# Patient Record
Sex: Female | Born: 1940 | Race: Black or African American | Hispanic: No | Smoking: Former smoker
Health system: Southern US, Community
[De-identification: ages and names within clinical notes are randomized; demographics above are authoritative.]

## PROBLEM LIST (undated history)

## (undated) DIAGNOSIS — J189 Pneumonia, unspecified organism: Secondary | ICD-10-CM

## (undated) DIAGNOSIS — R0600 Dyspnea, unspecified: Secondary | ICD-10-CM

## (undated) DIAGNOSIS — C7951 Secondary malignant neoplasm of bone: Principal | ICD-10-CM

## (undated) DIAGNOSIS — C3490 Malignant neoplasm of unspecified part of unspecified bronchus or lung: Secondary | ICD-10-CM

## (undated) DIAGNOSIS — D649 Anemia, unspecified: Secondary | ICD-10-CM

## (undated) DIAGNOSIS — R7303 Prediabetes: Secondary | ICD-10-CM

## (undated) DIAGNOSIS — I10 Essential (primary) hypertension: Secondary | ICD-10-CM

## (undated) DIAGNOSIS — E785 Hyperlipidemia, unspecified: Secondary | ICD-10-CM

## (undated) HISTORY — DX: Malignant neoplasm of unspecified part of unspecified bronchus or lung: C34.90

## (undated) HISTORY — DX: Secondary malignant neoplasm of bone: C79.51

## (undated) HISTORY — PX: APPENDECTOMY: SHX54

## (undated) HISTORY — PX: ABDOMINAL HYSTERECTOMY: SHX81

---

## 2000-07-18 ENCOUNTER — Ambulatory Visit (HOSPITAL_COMMUNITY): Admission: RE | Admit: 2000-07-18 | Discharge: 2000-07-18 | Payer: Self-pay | Admitting: Family Medicine

## 2000-07-18 ENCOUNTER — Encounter: Payer: Self-pay | Admitting: Family Medicine

## 2003-02-23 ENCOUNTER — Ambulatory Visit (HOSPITAL_COMMUNITY): Admission: RE | Admit: 2003-02-23 | Discharge: 2003-02-23 | Payer: Self-pay | Admitting: Family Medicine

## 2005-08-19 ENCOUNTER — Ambulatory Visit (HOSPITAL_COMMUNITY): Admission: RE | Admit: 2005-08-19 | Discharge: 2005-08-19 | Payer: Self-pay | Admitting: Family Medicine

## 2006-07-16 ENCOUNTER — Ambulatory Visit (HOSPITAL_COMMUNITY): Admission: RE | Admit: 2006-07-16 | Discharge: 2006-07-16 | Payer: Self-pay | Admitting: General Surgery

## 2007-12-23 ENCOUNTER — Ambulatory Visit (HOSPITAL_COMMUNITY): Admission: RE | Admit: 2007-12-23 | Discharge: 2007-12-23 | Payer: Self-pay | Admitting: Family Medicine

## 2009-02-21 ENCOUNTER — Ambulatory Visit (HOSPITAL_COMMUNITY): Admission: RE | Admit: 2009-02-21 | Discharge: 2009-02-21 | Payer: Self-pay | Admitting: Family Medicine

## 2010-06-19 NOTE — H&P (Signed)
Melinda Larsen, Melinda Larsen               ACCOUNT NO.:  [id number]   MEDICAL RECORD NO.:  [id number]          PATIENT TYPE:  AMB   LOCATION:                                FACILITY:  [facility]   PHYSICIAN:  Dalia Heading, M.D.  DATE OF BIRTH:  10/05/1940   DATE OF ADMISSION:  DATE OF DISCHARGE:  LH                              HISTORY & PHYSICAL   CHIEF COMPLAINT:  Need for screening colonoscopy.   HISTORY OF PRESENT ILLNESS:  The patient is a 70 year old black female  who is referred for endoscopic evaluation.  She needs a colonoscopy for  screening purposes.  No abdominal pain, weight loss, nausea, vomiting,  diarrhea, constipation, melena, hematochezia have been noted.  She has  never had a colonoscopy.  There is no family history of colon carcinoma.   PAST MEDICAL HISTORY:  Hypertension.   PAST SURGICAL HISTORY:  Hysterectomy.   CURRENT MEDICATIONS:  1. Blood pressure pill.  2. Fluid pill.  3. Cholesterol pill.   ALLERGIES:  No known drug allergies.   REVIEW OF SYSTEMS:  The patient does smoke.  She denies any alcohol use.  She denies any cardiopulmonary difficulties or bleeding disorders.   PHYSICAL EXAMINATION:  GENERAL:  The patient is a well-developed, well-  nourished black female in no acute distress.  LUNGS:  Clear to auscultation with good breath sounds bilaterally.  HEART:  Regular rate and rhythm without S3, S4, murmurs.  ABDOMEN:  Soft, nontender, nondistended.  No hepatosplenomegaly or  masses noted.  RECTAL:  Deferred to the procedure.   IMPRESSION:  Need for screening colonoscopy.   PLAN:  The patient was scheduled for a colonoscopy on July 16, 2006.  The risks and benefits of the procedure including bleeding and  perforation were fully explained to the patient.  Gave informed consent.      Dalia Heading, M.D.  Electronically Signed     MAJ/MEDQ  D:  07/03/2006  T:  07/03/2006  Job:  [id number]   cc:   Patrica Duel, M.D.  Fax: [phone]

## 2010-06-22 NOTE — H&P (Signed)
Melinda Larsen, Melinda Larsen               ACCOUNT NO.:  [id number]   MEDICAL RECORD NO.:  [id number]          PATIENT TYPE:  OUT   LOCATION:  [facility]                           FACILITY:  [facility]   PHYSICIAN:  Dalia Heading, M.D.  DATE OF BIRTH:  1940-06-12   DATE OF ADMISSION:  12/23/2007  DATE OF DISCHARGE:  LH                              HISTORY & PHYSICAL   CHIEF COMPLAINT:  Need for screening colonoscopy.   HISTORY OF PRESENT ILLNESS:  The patient is a 70 year old black female  who is referred for endoscopic evaluation.  She needs a colonoscopy for  screening purposes.  No abdominal pain, weight loss, nausea, vomiting,  diarrhea, constipation, melena, hematochezia have been noted.  She has  never had a colonoscopy.  There is no family history of colon carcinoma.   PAST MEDICAL HISTORY:  A left breast carcinoma, hypertension, morbid  obesity, subacute thyroiditis.   PAST SURGICAL HISTORY:  Left partial mastectomy with sentinel lymph node  biopsy in 2006.   CURRENT MEDICATIONS:  Lotrel, Diovan/hydrochlorothiazide, Crestor.   ALLERGIES:  No known drug allergies.   REVIEW OF SYSTEMS:  Noncontributory.   PHYSICAL EXAMINATION:  GENERAL:  The patient is a well-developed, well-  nourished black female in no acute distress.  LUNGS:  Clear to auscultation with equal breath sounds bilaterally.  HEART:  Examination reveals regular rate and rhythm without S3, S4, or  murmurs.  ABDOMEN:  Soft, nontender, nondistended.  No hepatosplenomegaly or  masses noted.  RECTAL:  Examination was deferred due to the procedure.   IMPRESSION:  Need for screening colonoscopy.   PLAN:  The patient is scheduled for a colonoscopy on January 11, 2008.  The risks and benefits of the procedure including bleeding and  perforation were fully explained to the patient, gave informed consent.      Dalia Heading, M.D.     MAJ/MEDQ  D:  12/24/2007  T:  12/24/2007  Job:  [id number]   cc:   Milus Mallick. Lodema Hong,  M.D.  Fax: [phone]   Short Stay at [facility] S. Mariel Sleet, MD  Fax: [phone]

## 2010-08-13 ENCOUNTER — Other Ambulatory Visit (HOSPITAL_COMMUNITY): Payer: Self-pay | Admitting: Internal Medicine

## 2010-08-13 DIAGNOSIS — Z139 Encounter for screening, unspecified: Secondary | ICD-10-CM

## 2010-08-13 DIAGNOSIS — I10 Essential (primary) hypertension: Secondary | ICD-10-CM

## 2010-08-16 ENCOUNTER — Ambulatory Visit (HOSPITAL_COMMUNITY)
Admission: RE | Admit: 2010-08-16 | Discharge: 2010-08-16 | Disposition: A | Discharge status: Home / Self Care | Payer: No Typology Code available for payment source | Source: Ambulatory Visit | Attending: Internal Medicine | Admitting: Internal Medicine

## 2010-08-16 ENCOUNTER — Encounter (HOSPITAL_COMMUNITY): Payer: Self-pay

## 2010-08-16 DIAGNOSIS — Z1231 Encounter for screening mammogram for malignant neoplasm of breast: Secondary | ICD-10-CM | POA: Insufficient documentation

## 2010-08-16 DIAGNOSIS — I10 Essential (primary) hypertension: Secondary | ICD-10-CM

## 2010-08-16 DIAGNOSIS — M899 Disorder of bone, unspecified: Secondary | ICD-10-CM | POA: Insufficient documentation

## 2010-08-16 DIAGNOSIS — Z139 Encounter for screening, unspecified: Secondary | ICD-10-CM

## 2010-08-16 DIAGNOSIS — M949 Disorder of cartilage, unspecified: Secondary | ICD-10-CM | POA: Insufficient documentation

## 2010-08-16 HISTORY — DX: Essential (primary) hypertension: I10

## 2011-09-10 ENCOUNTER — Other Ambulatory Visit (HOSPITAL_COMMUNITY): Payer: Self-pay | Admitting: Internal Medicine

## 2011-09-10 DIAGNOSIS — Z139 Encounter for screening, unspecified: Secondary | ICD-10-CM

## 2011-09-16 ENCOUNTER — Ambulatory Visit (HOSPITAL_COMMUNITY): Payer: No Typology Code available for payment source

## 2011-09-16 ENCOUNTER — Ambulatory Visit (HOSPITAL_COMMUNITY)
Admission: RE | Admit: 2011-09-16 | Discharge: 2011-09-16 | Disposition: A | Discharge status: Home / Self Care | Payer: No Typology Code available for payment source | Source: Ambulatory Visit | Attending: Internal Medicine | Admitting: Internal Medicine

## 2011-09-16 DIAGNOSIS — Z139 Encounter for screening, unspecified: Secondary | ICD-10-CM

## 2011-09-16 DIAGNOSIS — Z1231 Encounter for screening mammogram for malignant neoplasm of breast: Secondary | ICD-10-CM | POA: Insufficient documentation

## 2013-04-02 ENCOUNTER — Other Ambulatory Visit (HOSPITAL_COMMUNITY): Payer: Self-pay | Admitting: Internal Medicine

## 2013-04-02 DIAGNOSIS — Z139 Encounter for screening, unspecified: Secondary | ICD-10-CM

## 2013-04-08 ENCOUNTER — Ambulatory Visit (HOSPITAL_COMMUNITY)
Admission: RE | Admit: 2013-04-08 | Discharge: 2013-04-08 | Disposition: A | Discharge status: Home / Self Care | Payer: Medicare HMO | Source: Ambulatory Visit | Attending: Internal Medicine | Admitting: Internal Medicine

## 2013-04-08 DIAGNOSIS — Z139 Encounter for screening, unspecified: Secondary | ICD-10-CM

## 2013-04-08 DIAGNOSIS — Z1231 Encounter for screening mammogram for malignant neoplasm of breast: Secondary | ICD-10-CM | POA: Insufficient documentation

## 2014-04-08 DIAGNOSIS — Z6824 Body mass index (BMI) 24.0-24.9, adult: Secondary | ICD-10-CM | POA: Diagnosis not present

## 2014-04-08 DIAGNOSIS — I10 Essential (primary) hypertension: Secondary | ICD-10-CM | POA: Diagnosis not present

## 2014-04-08 DIAGNOSIS — K219 Gastro-esophageal reflux disease without esophagitis: Secondary | ICD-10-CM | POA: Diagnosis not present

## 2014-04-08 DIAGNOSIS — E119 Type 2 diabetes mellitus without complications: Secondary | ICD-10-CM | POA: Diagnosis not present

## 2014-06-08 DIAGNOSIS — H5210 Myopia, unspecified eye: Secondary | ICD-10-CM | POA: Diagnosis not present

## 2014-06-08 DIAGNOSIS — H5200 Hypermetropia, unspecified eye: Secondary | ICD-10-CM | POA: Diagnosis not present

## 2014-06-08 DIAGNOSIS — I10 Essential (primary) hypertension: Secondary | ICD-10-CM | POA: Diagnosis not present

## 2014-06-28 DIAGNOSIS — E1140 Type 2 diabetes mellitus with diabetic neuropathy, unspecified: Secondary | ICD-10-CM | POA: Diagnosis not present

## 2014-07-20 DIAGNOSIS — I10 Essential (primary) hypertension: Secondary | ICD-10-CM | POA: Diagnosis not present

## 2014-07-20 DIAGNOSIS — E782 Mixed hyperlipidemia: Secondary | ICD-10-CM | POA: Diagnosis not present

## 2014-07-20 DIAGNOSIS — K219 Gastro-esophageal reflux disease without esophagitis: Secondary | ICD-10-CM | POA: Diagnosis not present

## 2014-07-20 DIAGNOSIS — Z6822 Body mass index (BMI) 22.0-22.9, adult: Secondary | ICD-10-CM | POA: Diagnosis not present

## 2014-07-20 DIAGNOSIS — E119 Type 2 diabetes mellitus without complications: Secondary | ICD-10-CM | POA: Diagnosis not present

## 2014-07-20 DIAGNOSIS — E1140 Type 2 diabetes mellitus with diabetic neuropathy, unspecified: Secondary | ICD-10-CM | POA: Diagnosis not present

## 2014-07-20 DIAGNOSIS — Z0000 Encounter for general adult medical examination without abnormal findings: Secondary | ICD-10-CM | POA: Diagnosis not present

## 2014-09-13 DIAGNOSIS — H524 Presbyopia: Secondary | ICD-10-CM | POA: Diagnosis not present

## 2014-09-13 DIAGNOSIS — H25012 Cortical age-related cataract, left eye: Secondary | ICD-10-CM | POA: Diagnosis not present

## 2014-09-13 DIAGNOSIS — H2511 Age-related nuclear cataract, right eye: Secondary | ICD-10-CM | POA: Diagnosis not present

## 2014-09-13 DIAGNOSIS — H2512 Age-related nuclear cataract, left eye: Secondary | ICD-10-CM | POA: Diagnosis not present

## 2014-09-23 DIAGNOSIS — H2512 Age-related nuclear cataract, left eye: Secondary | ICD-10-CM | POA: Diagnosis not present

## 2014-09-23 DIAGNOSIS — H25812 Combined forms of age-related cataract, left eye: Secondary | ICD-10-CM | POA: Diagnosis not present

## 2014-09-30 DIAGNOSIS — Z961 Presence of intraocular lens: Secondary | ICD-10-CM | POA: Diagnosis not present

## 2014-09-30 DIAGNOSIS — H52223 Regular astigmatism, bilateral: Secondary | ICD-10-CM | POA: Diagnosis not present

## 2014-09-30 DIAGNOSIS — E119 Type 2 diabetes mellitus without complications: Secondary | ICD-10-CM | POA: Diagnosis not present

## 2014-10-11 ENCOUNTER — Encounter (HOSPITAL_COMMUNITY): Payer: Self-pay | Admitting: Emergency Medicine

## 2014-10-11 ENCOUNTER — Emergency Department (HOSPITAL_COMMUNITY)
Admission: EM | Admit: 2014-10-11 | Discharge: 2014-10-11 | Disposition: A | Discharge status: Home / Self Care | Payer: Commercial Managed Care - HMO | Attending: Emergency Medicine | Admitting: Emergency Medicine

## 2014-10-11 ENCOUNTER — Emergency Department (HOSPITAL_COMMUNITY): Payer: Commercial Managed Care - HMO

## 2014-10-11 DIAGNOSIS — S52572A Other intraarticular fracture of lower end of left radius, initial encounter for closed fracture: Secondary | ICD-10-CM | POA: Diagnosis not present

## 2014-10-11 DIAGNOSIS — I10 Essential (primary) hypertension: Secondary | ICD-10-CM | POA: Insufficient documentation

## 2014-10-11 DIAGNOSIS — S6992XA Unspecified injury of left wrist, hand and finger(s), initial encounter: Secondary | ICD-10-CM | POA: Diagnosis present

## 2014-10-11 DIAGNOSIS — Z87891 Personal history of nicotine dependence: Secondary | ICD-10-CM | POA: Diagnosis not present

## 2014-10-11 DIAGNOSIS — Y998 Other external cause status: Secondary | ICD-10-CM | POA: Diagnosis not present

## 2014-10-11 DIAGNOSIS — Y9389 Activity, other specified: Secondary | ICD-10-CM | POA: Insufficient documentation

## 2014-10-11 DIAGNOSIS — S52502A Unspecified fracture of the lower end of left radius, initial encounter for closed fracture: Secondary | ICD-10-CM | POA: Diagnosis not present

## 2014-10-11 DIAGNOSIS — Y92481 Parking lot as the place of occurrence of the external cause: Secondary | ICD-10-CM | POA: Insufficient documentation

## 2014-10-11 DIAGNOSIS — S5292XA Unspecified fracture of left forearm, initial encounter for closed fracture: Secondary | ICD-10-CM

## 2014-10-11 DIAGNOSIS — W010XXA Fall on same level from slipping, tripping and stumbling without subsequent striking against object, initial encounter: Secondary | ICD-10-CM | POA: Insufficient documentation

## 2014-10-11 MED ORDER — HYDROCODONE-ACETAMINOPHEN 5-325 MG PO TABS: 1.0000 | ORAL_TABLET | ORAL | Status: DC | PRN

## 2014-10-11 MED ORDER — ACETAMINOPHEN 325 MG PO TABS
650.0000 mg | ORAL_TABLET | Freq: Once | ORAL | Status: AC
  Administered 2014-10-11: 650 mg via ORAL
  Filled 2014-10-11: qty 2

## 2014-10-11 NOTE — ED Notes (Signed)
MD at bedside. 

## 2014-10-11 NOTE — ED Notes (Signed)
Patient states she was at [employer] in the parking lot and tripped. Reports of pain to left wrist/hand. Swelling noted to left hand/wrist. Abrasion noted to left knee. Bleeding controlled. . Denies any other pain.

## 2014-10-11 NOTE — Discharge Instructions (Signed)
Splint Care Splints protect and rest injuries. Splints can be made of plaster, fiberglass, or metal. They are used to treat broken bones, sprains, tendonitis, and other injuries. HOME CARE  Keep the injured area raised (elevated) while sitting or lying down. Keep the injured body part just above the level of the heart. This will decrease puffiness (swelling) and pain.  If an elastic bandage was used to hold the splint, it can be loosened. Only loosen it to make room for puffiness and to ease pain.  Keep the splint clean and dry.  Do not scratch the skin under the splint with sharp or pointed objects.  Follow up with your doctor as told. GET HELP RIGHT AWAY IF:   There is more pain or pressure around the injury.  There is numbness, tingling, or pain in the toes or fingers past the injury.  The fingers or toes become cold or blue.  The splint becomes too soft or breaks before the injury is healed. MAKE SURE YOU:   Understand these instructions.  Will watch this condition.  Will get help right away if you are not doing well or get worse. Document Released: 10/31/2007 Document Revised: 04/15/2011 Document Reviewed: 10/31/2007 [facility] Patient Information 2015 [place], [place]. This information is not intended to replace advice given to you by your health care provider. Make sure you discuss any questions you have with your health care provider.   Forearm Fracture Your caregiver has diagnosed you as having a broken bone (fracture) of the forearm. This is the part of your arm between the elbow and your wrist. Your forearm is made up of two bones. These are the radius and ulna. A fracture is a break in one or both bones. A cast or splint is used to protect and keep your injured bone from moving. The cast or splint will be on generally for about 5 to 6 weeks, with individual variations. HOME CARE INSTRUCTIONS   Keep the injured part elevated while sitting or lying down. Keeping the  injury above the level of your heart (the center of the chest). This will decrease swelling and pain.  Apply ice to the injury for 15-20 minutes, 03-04 times per day while awake, for 2 days. Put the ice in a plastic bag and place a thin towel between the bag of ice and your cast or splint.  If you have a plaster or fiberglass cast:  Do not try to scratch the skin under the cast using sharp or pointed objects.  Check the skin around the cast every day. You may put lotion on any red or sore areas.  Keep your cast dry and clean.  If you have a plaster splint:  Wear the splint as directed.  You may loosen the elastic around the splint if your fingers become numb, tingle, or turn cold or blue.  Do not put pressure on any part of your cast or splint. It may break. Rest your cast only on a pillow the first 24 hours until it is fully hardened.  Your cast or splint can be protected during bathing with a plastic bag. Do not lower the cast or splint into water.  Only take over-the-counter or prescription medicines for pain, discomfort, or fever as directed by your caregiver. SEEK IMMEDIATE MEDICAL CARE IF:   Your cast gets damaged or breaks.  You have more severe pain or swelling than you did before the cast.  Your skin or nails below the injury turn blue or gray,  or feel cold or numb.  There is a bad smell or new stains and/or pus like (purulent) drainage coming from under the cast. MAKE SURE YOU:   Understand these instructions.  Will watch your condition.  Will get help right away if you are not doing well or get worse. Document Released: 01/19/2000 Document Revised: 04/15/2011 Document Reviewed: 09/10/2007 [facility] Patient Information 2015 [place], [place]. This information is not intended to replace advice given to you by your health care provider. Make sure you discuss any questions you have with your health care provider.

## 2014-10-11 NOTE — ED Notes (Signed)
Pt tripped and fell today at 4pm. C/o left wrist pain.

## 2014-10-12 DIAGNOSIS — I10 Essential (primary) hypertension: Secondary | ICD-10-CM | POA: Diagnosis not present

## 2014-10-12 DIAGNOSIS — S52531A Colles' fracture of right radius, initial encounter for closed fracture: Secondary | ICD-10-CM | POA: Diagnosis not present

## 2014-10-12 NOTE — ED Provider Notes (Signed)
CSN: [id number]     Arrival date & time 10/11/14  1744 History   First MD Initiated Contact with Patient 10/11/14 1801     Chief Complaint  Patient presents with  . Fall     (Consider location/radiation/quality/duration/timing/severity/associated sxs/prior Treatment) The history is provided by the patient.   Melinda Larsen is a 74 y.o. female presenting for evaluation of left wrist pain and swelling since she tripped and fell in a store parking lot around 4 pm today landing on her outstretched left hand.  She returned home and has used ice and elevation but her pain persists, hence her delayed presentation.  She denies head injury, neck or other pain.  She has taken no medicine for pain prior to arrival.  She is right handed.     Past Medical History  Diagnosis Date  . Hypertension    Past Surgical History  Procedure Laterality Date  . Abdominal hysterectomy     No family history on file. Social History  Substance Use Topics  . Smoking status: Former Research scientist (life sciences)  . Smokeless tobacco: None  . Alcohol Use: No   OB History    No data available     Review of Systems  Constitutional: Negative for fever.  Musculoskeletal: Positive for joint swelling and arthralgias. Negative for myalgias.  Neurological: Negative for weakness and numbness.      Allergies  Codeine  Home Medications   Prior to Admission medications   Medication Sig Start Date End Date Taking? Authorizing Provider  HYDROcodone-acetaminophen (NORCO/VICODIN) 5-325 MG per tablet Take 1 tablet by mouth every 4 (four) hours as needed. 10/11/14   Evalee Jefferson, PA-C   BP 178/76 mmHg  Pulse 57  Temp(Src) 98.7 F (37.1 C) (Oral)  Resp 17  Ht '5\' 10"'$  (1.778 m)  Wt 160 lb (72.576 kg)  BMI 22.96 kg/m2  SpO2 100% Physical Exam  Constitutional: She appears well-developed and well-nourished.  HENT:  Head: Atraumatic.  Neck: Normal range of motion.  Cardiovascular:  Pulses equal bilaterally  Musculoskeletal: She  exhibits tenderness.       Left wrist: She exhibits decreased range of motion, tenderness, bony tenderness and swelling. She exhibits no effusion and no deformity.  ttp left dorsal wrist without palpable deformity.  Pain also across left hand metacarpals.  She has FROM of fingers but with discomfort radiating to wrist.  Less than 3 sec cap refill in fingertips, sensation normal.  Radial pulse full.  No appreciable edema or bruising.  Neurological: She is alert. She has normal strength. She displays normal reflexes. No sensory deficit.  Skin: Skin is warm and dry.  Psychiatric: She has a normal mood and affect.    ED Course  Procedures (including critical care time) Labs Review Labs Reviewed - No data to display  Imaging Review Dg Wrist Complete Left  10/11/2014   CLINICAL DATA:  Fall on outstretched hand with left wrist pain.  EXAM: LEFT WRIST - COMPLETE 3+ VIEW  COMPARISON:  None.  FINDINGS: There is a nondisplaced intra-articular fracture of the dorsal/radial aspect of the left distal radius, with minimal comminution and surrounding soft tissue swelling. No additional fracture is seen in the left wrist. No left wrist dislocation. No suspicious focal osseous lesion. Left wrist joint spaces appear normal.  IMPRESSION: Nondisplaced minimally comminuted intra-articular left distal radius fracture.   Electronically Signed   By: Ilona Sorrel M.D.   On: 10/11/2014 19:05   Dg Hand Complete Left  10/11/2014   CLINICAL DATA:  Pain in the left hand and wrist since falling down on outstretched hand.  EXAM: LEFT HAND - COMPLETE 3+ VIEW  COMPARISON:  None.  FINDINGS: There is a nondisplaced oblique fracture through the distal radius with intra-articular extension to the radiocarpal joint. There is no evidence of focal bone abnormality. Osteoarthritic changes of the radiocarpal joint, and all of the interphalangeal joints are seen. Soft tissues swelling of the wrist is seen.  IMPRESSION: Nondisplaced  intra-articular fracture of the distal radius.  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   Electronically Signed   By: Fidela Salisbury M.D.   On: 10/11/2014 19:07   I have personally reviewed and evaluated these images and lab results as part of my medical decision-making.   EKG Interpretation None      MDM   Final diagnoses:  Radial fracture, left, closed, initial encounter    Discussed with Dr. Luna Glasgow who will see pt in am, pt understands to call for appt time.  She advised ice, elevation, hydrocdone prescribed.  Placed in sugar tong, sling.  Prn f/u in the interim for any worsened sx.  Pt was seen by Dr. Ralene Bathe during this visit.  Splint was examined post application, pain improved,  Patient can wiggle digits, less than 3 sec cap refill.      Evalee Jefferson, PA-C 10/12/14 1349  Quintella Reichert, MD 10/12/14 1535

## 2014-10-18 MED FILL — Hydrocodone-Acetaminophen Tab 5-325 MG: ORAL | Qty: 6 | Status: AC

## 2014-10-19 DIAGNOSIS — I10 Essential (primary) hypertension: Secondary | ICD-10-CM | POA: Diagnosis not present

## 2014-10-19 DIAGNOSIS — S52531D Colles' fracture of right radius, subsequent encounter for closed fracture with routine healing: Secondary | ICD-10-CM | POA: Diagnosis not present

## 2014-11-01 DIAGNOSIS — S62102S Fracture of unspecified carpal bone, left wrist, sequela: Secondary | ICD-10-CM | POA: Diagnosis not present

## 2014-11-01 DIAGNOSIS — Z23 Encounter for immunization: Secondary | ICD-10-CM | POA: Diagnosis not present

## 2014-11-01 DIAGNOSIS — Z6823 Body mass index (BMI) 23.0-23.9, adult: Secondary | ICD-10-CM | POA: Diagnosis not present

## 2014-11-01 DIAGNOSIS — Z1389 Encounter for screening for other disorder: Secondary | ICD-10-CM | POA: Diagnosis not present

## 2014-11-02 DIAGNOSIS — S52531D Colles' fracture of right radius, subsequent encounter for closed fracture with routine healing: Secondary | ICD-10-CM | POA: Diagnosis not present

## 2014-11-02 DIAGNOSIS — I10 Essential (primary) hypertension: Secondary | ICD-10-CM | POA: Diagnosis not present

## 2014-11-23 DIAGNOSIS — I10 Essential (primary) hypertension: Secondary | ICD-10-CM | POA: Diagnosis not present

## 2014-11-23 DIAGNOSIS — S52531D Colles' fracture of right radius, subsequent encounter for closed fracture with routine healing: Secondary | ICD-10-CM | POA: Diagnosis not present

## 2014-12-21 DIAGNOSIS — S52532D Colles' fracture of left radius, subsequent encounter for closed fracture with routine healing: Secondary | ICD-10-CM | POA: Diagnosis not present

## 2014-12-21 DIAGNOSIS — Z6823 Body mass index (BMI) 23.0-23.9, adult: Secondary | ICD-10-CM | POA: Diagnosis not present

## 2014-12-21 DIAGNOSIS — I10 Essential (primary) hypertension: Secondary | ICD-10-CM | POA: Diagnosis not present

## 2015-03-17 ENCOUNTER — Other Ambulatory Visit (HOSPITAL_COMMUNITY): Payer: Self-pay | Admitting: Internal Medicine

## 2015-03-17 DIAGNOSIS — Z1231 Encounter for screening mammogram for malignant neoplasm of breast: Secondary | ICD-10-CM

## 2015-03-20 ENCOUNTER — Ambulatory Visit (HOSPITAL_COMMUNITY)
Admission: RE | Admit: 2015-03-20 | Discharge: 2015-03-20 | Disposition: A | Discharge status: Home / Self Care | Payer: Commercial Managed Care - HMO | Source: Ambulatory Visit | Attending: Internal Medicine | Admitting: Internal Medicine

## 2015-03-20 DIAGNOSIS — Z1231 Encounter for screening mammogram for malignant neoplasm of breast: Secondary | ICD-10-CM | POA: Insufficient documentation

## 2015-03-27 DIAGNOSIS — Z1389 Encounter for screening for other disorder: Secondary | ICD-10-CM | POA: Diagnosis not present

## 2015-03-27 DIAGNOSIS — Z0000 Encounter for general adult medical examination without abnormal findings: Secondary | ICD-10-CM | POA: Diagnosis not present

## 2015-03-27 DIAGNOSIS — Z6823 Body mass index (BMI) 23.0-23.9, adult: Secondary | ICD-10-CM | POA: Diagnosis not present

## 2015-03-27 DIAGNOSIS — E1140 Type 2 diabetes mellitus with diabetic neuropathy, unspecified: Secondary | ICD-10-CM | POA: Diagnosis not present

## 2015-03-27 DIAGNOSIS — Z0001 Encounter for general adult medical examination with abnormal findings: Secondary | ICD-10-CM | POA: Diagnosis not present

## 2015-04-04 ENCOUNTER — Other Ambulatory Visit (HOSPITAL_COMMUNITY): Payer: Self-pay | Admitting: Internal Medicine

## 2015-04-06 ENCOUNTER — Other Ambulatory Visit (HOSPITAL_COMMUNITY): Payer: Self-pay | Admitting: Internal Medicine

## 2015-04-24 ENCOUNTER — Other Ambulatory Visit (HOSPITAL_COMMUNITY): Payer: Self-pay | Admitting: Internal Medicine

## 2015-05-16 ENCOUNTER — Other Ambulatory Visit (HOSPITAL_COMMUNITY): Payer: Self-pay | Admitting: Internal Medicine

## 2015-07-07 ENCOUNTER — Other Ambulatory Visit (HOSPITAL_COMMUNITY): Payer: Self-pay | Admitting: Internal Medicine

## 2015-07-07 DIAGNOSIS — S62102S Fracture of unspecified carpal bone, left wrist, sequela: Secondary | ICD-10-CM

## 2015-07-12 ENCOUNTER — Other Ambulatory Visit (HOSPITAL_COMMUNITY): Payer: Self-pay | Admitting: Internal Medicine

## 2015-07-12 ENCOUNTER — Ambulatory Visit (HOSPITAL_COMMUNITY)
Admission: RE | Admit: 2015-07-12 | Discharge: 2015-07-12 | Disposition: A | Discharge status: Home / Self Care | Payer: Commercial Managed Care - HMO | Source: Ambulatory Visit | Attending: Internal Medicine | Admitting: Internal Medicine

## 2015-07-12 DIAGNOSIS — S62102S Fracture of unspecified carpal bone, left wrist, sequela: Secondary | ICD-10-CM

## 2015-07-12 DIAGNOSIS — Z13820 Encounter for screening for osteoporosis: Secondary | ICD-10-CM | POA: Diagnosis not present

## 2015-07-12 DIAGNOSIS — X58XXXS Exposure to other specified factors, sequela: Secondary | ICD-10-CM | POA: Diagnosis not present

## 2015-07-12 DIAGNOSIS — Z780 Asymptomatic menopausal state: Secondary | ICD-10-CM | POA: Diagnosis not present

## 2015-09-15 DIAGNOSIS — E119 Type 2 diabetes mellitus without complications: Secondary | ICD-10-CM | POA: Diagnosis not present

## 2015-09-15 DIAGNOSIS — I10 Essential (primary) hypertension: Secondary | ICD-10-CM | POA: Diagnosis not present

## 2015-09-15 DIAGNOSIS — L84 Corns and callosities: Secondary | ICD-10-CM | POA: Diagnosis not present

## 2015-09-15 DIAGNOSIS — E782 Mixed hyperlipidemia: Secondary | ICD-10-CM | POA: Diagnosis not present

## 2015-09-15 DIAGNOSIS — Z6823 Body mass index (BMI) 23.0-23.9, adult: Secondary | ICD-10-CM | POA: Diagnosis not present

## 2015-09-15 DIAGNOSIS — R201 Hypoesthesia of skin: Secondary | ICD-10-CM | POA: Diagnosis not present

## 2015-09-15 DIAGNOSIS — F419 Anxiety disorder, unspecified: Secondary | ICD-10-CM | POA: Diagnosis not present

## 2015-09-20 DIAGNOSIS — I10 Essential (primary) hypertension: Secondary | ICD-10-CM | POA: Diagnosis not present

## 2015-09-20 DIAGNOSIS — H5210 Myopia, unspecified eye: Secondary | ICD-10-CM | POA: Diagnosis not present

## 2015-09-20 DIAGNOSIS — H5200 Hypermetropia, unspecified eye: Secondary | ICD-10-CM | POA: Diagnosis not present

## 2016-03-27 DIAGNOSIS — E1140 Type 2 diabetes mellitus with diabetic neuropathy, unspecified: Secondary | ICD-10-CM | POA: Diagnosis not present

## 2016-03-27 DIAGNOSIS — R5383 Other fatigue: Secondary | ICD-10-CM | POA: Diagnosis not present

## 2016-03-27 DIAGNOSIS — Z1389 Encounter for screening for other disorder: Secondary | ICD-10-CM | POA: Diagnosis not present

## 2016-03-27 DIAGNOSIS — Z6823 Body mass index (BMI) 23.0-23.9, adult: Secondary | ICD-10-CM | POA: Diagnosis not present

## 2016-03-27 DIAGNOSIS — E119 Type 2 diabetes mellitus without complications: Secondary | ICD-10-CM | POA: Diagnosis not present

## 2016-03-27 DIAGNOSIS — Z0001 Encounter for general adult medical examination with abnormal findings: Secondary | ICD-10-CM | POA: Diagnosis not present

## 2016-03-27 DIAGNOSIS — E782 Mixed hyperlipidemia: Secondary | ICD-10-CM | POA: Diagnosis not present

## 2016-11-06 DIAGNOSIS — K219 Gastro-esophageal reflux disease without esophagitis: Secondary | ICD-10-CM | POA: Diagnosis not present

## 2016-11-06 DIAGNOSIS — F17290 Nicotine dependence, other tobacco product, uncomplicated: Secondary | ICD-10-CM | POA: Diagnosis not present

## 2016-11-06 DIAGNOSIS — E1140 Type 2 diabetes mellitus with diabetic neuropathy, unspecified: Secondary | ICD-10-CM | POA: Diagnosis not present

## 2016-11-06 DIAGNOSIS — R201 Hypoesthesia of skin: Secondary | ICD-10-CM | POA: Diagnosis not present

## 2016-11-06 DIAGNOSIS — B351 Tinea unguium: Secondary | ICD-10-CM | POA: Diagnosis not present

## 2016-11-06 DIAGNOSIS — I10 Essential (primary) hypertension: Secondary | ICD-10-CM | POA: Diagnosis not present

## 2016-11-06 DIAGNOSIS — Z6823 Body mass index (BMI) 23.0-23.9, adult: Secondary | ICD-10-CM | POA: Diagnosis not present

## 2017-01-22 DIAGNOSIS — H5200 Hypermetropia, unspecified eye: Secondary | ICD-10-CM | POA: Diagnosis not present

## 2017-01-22 DIAGNOSIS — Z0100 Encounter for examination of eyes and vision without abnormal findings: Secondary | ICD-10-CM | POA: Diagnosis not present

## 2017-01-22 DIAGNOSIS — E1140 Type 2 diabetes mellitus with diabetic neuropathy, unspecified: Secondary | ICD-10-CM | POA: Diagnosis not present

## 2017-01-22 DIAGNOSIS — H25811 Combined forms of age-related cataract, right eye: Secondary | ICD-10-CM | POA: Diagnosis not present

## 2017-04-24 DIAGNOSIS — D649 Anemia, unspecified: Secondary | ICD-10-CM | POA: Diagnosis not present

## 2017-04-24 DIAGNOSIS — R201 Hypoesthesia of skin: Secondary | ICD-10-CM | POA: Diagnosis not present

## 2017-04-24 DIAGNOSIS — I10 Essential (primary) hypertension: Secondary | ICD-10-CM | POA: Diagnosis not present

## 2017-04-24 DIAGNOSIS — Z6823 Body mass index (BMI) 23.0-23.9, adult: Secondary | ICD-10-CM | POA: Diagnosis not present

## 2017-04-24 DIAGNOSIS — E1140 Type 2 diabetes mellitus with diabetic neuropathy, unspecified: Secondary | ICD-10-CM | POA: Diagnosis not present

## 2017-04-24 DIAGNOSIS — Z1389 Encounter for screening for other disorder: Secondary | ICD-10-CM | POA: Diagnosis not present

## 2017-04-25 ENCOUNTER — Other Ambulatory Visit (HOSPITAL_COMMUNITY): Payer: Self-pay | Admitting: Internal Medicine

## 2017-04-25 DIAGNOSIS — Z1231 Encounter for screening mammogram for malignant neoplasm of breast: Secondary | ICD-10-CM

## 2017-04-30 ENCOUNTER — Ambulatory Visit (HOSPITAL_COMMUNITY)
Admission: RE | Admit: 2017-04-30 | Discharge: 2017-04-30 | Disposition: A | Discharge status: Home / Self Care | Payer: Medicare HMO | Source: Ambulatory Visit | Attending: Internal Medicine | Admitting: Internal Medicine

## 2017-04-30 DIAGNOSIS — Z1231 Encounter for screening mammogram for malignant neoplasm of breast: Secondary | ICD-10-CM | POA: Insufficient documentation

## 2017-05-06 ENCOUNTER — Encounter: Payer: Self-pay | Admitting: Gastroenterology

## 2017-05-07 DIAGNOSIS — Z1211 Encounter for screening for malignant neoplasm of colon: Secondary | ICD-10-CM | POA: Diagnosis not present

## 2017-05-16 DIAGNOSIS — K219 Gastro-esophageal reflux disease without esophagitis: Secondary | ICD-10-CM | POA: Diagnosis not present

## 2017-05-16 DIAGNOSIS — Z6822 Body mass index (BMI) 22.0-22.9, adult: Secondary | ICD-10-CM | POA: Diagnosis not present

## 2017-05-16 DIAGNOSIS — I10 Essential (primary) hypertension: Secondary | ICD-10-CM | POA: Diagnosis not present

## 2017-05-16 DIAGNOSIS — Z0000 Encounter for general adult medical examination without abnormal findings: Secondary | ICD-10-CM | POA: Diagnosis not present

## 2017-05-16 DIAGNOSIS — E782 Mixed hyperlipidemia: Secondary | ICD-10-CM | POA: Diagnosis not present

## 2017-05-16 DIAGNOSIS — E1140 Type 2 diabetes mellitus with diabetic neuropathy, unspecified: Secondary | ICD-10-CM | POA: Diagnosis not present

## 2017-05-16 DIAGNOSIS — F419 Anxiety disorder, unspecified: Secondary | ICD-10-CM | POA: Diagnosis not present

## 2017-05-16 DIAGNOSIS — Z1389 Encounter for screening for other disorder: Secondary | ICD-10-CM | POA: Diagnosis not present

## 2017-05-22 ENCOUNTER — Ambulatory Visit: Payer: Medicare HMO

## 2017-06-16 ENCOUNTER — Ambulatory Visit: Payer: Medicare HMO

## 2017-06-16 ENCOUNTER — Ambulatory Visit (INDEPENDENT_AMBULATORY_CARE_PROVIDER_SITE_OTHER): Payer: Medicare HMO

## 2017-06-16 DIAGNOSIS — Z1211 Encounter for screening for malignant neoplasm of colon: Secondary | ICD-10-CM

## 2017-06-16 MED ORDER — NA SULFATE-K SULFATE-MG SULF 17.5-3.13-1.6 GM/177ML PO SOLN: 1.0000 | ORAL | 0 refills | Status: DC

## 2017-06-16 NOTE — Progress Notes (Signed)
Pt is on iron, do we need to hold this?

## 2017-06-16 NOTE — Progress Notes (Signed)
Gastroenterology Pre-Procedure Review  Request Date:06/16/17 Requesting Physician: Dr.Fusco ( previous tcs Dr.Jenkins, pt said it was a long time ago and she doesn't remember when)  PATIENT REVIEW QUESTIONS: The patient responded to the following health history questions as indicated:    1. Diabetes Melitis: no 2. Joint replacements in the past 12 months: no 3. Major health problems in the past 3 months: no 4. Has an artificial valve or MVP: no 5. Has a defibrillator: no 6. Has been advised in past to take antibiotics in advance of a procedure like teeth cleaning: no 7. Family history of colon cancer: no  8. Alcohol Use: no 9. History of sleep apnea: no  10. History of coronary artery or other vascular stents placed within the last 12 months: no 11. History of any prior anesthesia complications: no    MEDICATIONS & ALLERGIES:    Patient reports the following regarding taking any blood thinners:   Plavix? no Aspirin? yes (81mg ) Coumadin? no Brilinta? no Xarelto? no Eliquis? no Pradaxa? no Savaysa? no Effient? no  Patient confirms/reports the following medications:  Current Outpatient Medications  Medication Sig Dispense Refill  . amLODipine (NORVASC) 5 MG tablet Take 5 mg by mouth daily.    Marland Kitchen aspirin EC 81 MG tablet Take 81 mg by mouth daily.    . ferrous sulfate (FEROSUL) 325 (65 FE) MG tablet Take 325 mg by mouth daily with breakfast.    . lisinopril (PRINIVIL,ZESTRIL) 5 MG tablet Take 5 mg by mouth daily.    . simvastatin (ZOCOR) 40 MG tablet Take 40 mg by mouth daily.     No current facility-administered medications for this visit.     Patient confirms/reports the following allergies:  Allergies  Allergen Reactions  . Codeine     No orders of the defined types were placed in this encounter.   AUTHORIZATION INFORMATION Primary Insurance: [employer]  ID #: [id number] Pre-Cert / Josem Kaufmann required: no   SCHEDULE INFORMATION: Procedure has been scheduled as  follows:  Date: 07/16/17, Time: 10:30 Location: [facility] Dr.Fields  This Gastroenterology Pre-Precedure Review Form is being routed to the following provider(s): Roseanne Kaufman NP

## 2017-06-16 NOTE — Patient Instructions (Signed)
Melinda Larsen  1940-03-18 MRN: [id number]     Procedure Date: 07/16/17 Time to register: 9:30am Place to register: [facility] Procedure Time: 10:30am Scheduled provider: Barney Drain, MD    PREPARATION FOR COLONOSCOPY WITH SUPREP BOWEL PREP KIT  Note: Suprep Bowel Prep Kit is a split-dose (2day) regimen. Consumption of BOTH 6-ounce bottles is required for a complete prep.  Please notify us immediately if you are diabetic, take iron supplements, or if you are on Coumadin or any other blood thinners.                                                                                                                                                     1 DAY BEFORE PROCEDURE:  DATE: 07/15/17  DAY: Tuesday Continue clear liquids the entire day - NO SOLID FOOD.     At 6:00pm: Complete steps 1 through 4 below, using ONE (1) 6-ounce bottle, before going to bed. Step 1:  Pour ONE (1) 6-ounce bottle of SUPREP liquid into the mixing container.  Step 2:  Add cool drinking water to the 16 ounce line on the container and mix.  Note: Dilute the solution concentrate as directed prior to use. Step 3:  DRINK ALL the liquid in the container. Step 4:  You MUST drink an additional two (2) or more 16 ounce containers of water  over the next one (1) hour.   Continue clear liquids only, until midnight. Do not eat or drink anything after midnight. EXCEPTION: If you take medications for your heart, blood pressure, or breathing, you may take these medications with a small amount of clear liquid.   DAY OF PROCEDURE:   DATE: 07/16/17   DAY: Wednesday    5 hours before your procedure at 5:30am: Step 1:  Pour ONE (1) 6-ounce bottle of SUPREP liquid into the mixing container.  Step 2:  Add cool drinking water to the 16 ounce line on the container and mix.  Note: Dilute the solution concentrate as directed prior to use. Step 3:  DRINK ALL the liquid in the container. Step 4:  You MUST drink an  additional two (2) or more 16 ounce containers of water over the next one (1) hour. You MUST complete the final glass of water at least 3 hours before your colonoscopy.    Nothing by mouth past: 7:30am  You may take your morning medications with sip of water unless we have instructed otherwise.    Please see below for Dietary Information.  CLEAR LIQUIDS INCLUDE:  Water Jello (NOT red in color)   Ice Popsicles (NOT red in color)   Tea (sugar ok, no milk/cream) Powdered fruit flavored drinks  Coffee (sugar ok, no milk/cream) Gatorade/ Lemonade/ Kool-Aid  (NOT red in color)   Juice: apple, white grape, white cranberry Soft drinks  Clear bullion, consomme, broth (fat free beef/chicken/vegetable)  Carbonated beverages (any kind)  Strained chicken noodle soup Hard Candy   Remember: Clear liquids are liquids that will allow you to see your fingers on the other side of a clear glass. Be sure liquids are NOT red in color, and not cloudy, but CLEAR.  DO NOT EAT OR DRINK ANY OF THE FOLLOWING:  Dairy products of any kind   Cranberry juice Tomato juice / V8 juice   Grapefruit juice Orange juice     Red grape juice  Do not eat any solid foods, including such foods as: cereal, oatmeal, yogurt, fruits, vegetables, creamed soups, eggs, bread, crackers, pureed foods in a blender, etc.   HELPFUL HINTS FOR DRINKING PREP SOLUTION:   Make sure prep is extremely cold. Mix and refrigerate the the morning of the prep. You may also put in the freezer.   You may try mixing some Crystal Light or Country Time Lemonade if you prefer. Mix in small amounts; add more if necessary.  Try drinking through a straw  Rinse mouth with water or a mouthwash between glasses, to remove after-taste.  Try sipping on a cold beverage /ice/ popsicles between glasses of prep.  Place a piece of sugar-free hard candy in mouth between glasses.  If you become nauseated, try consuming smaller amounts, or stretch out the time  between glasses. Stop for 30-60 minutes, then slowly start back drinking.     OTHER INSTRUCTIONS  You will need a responsible adult at least 76 years of age to accompany you and drive you home. This person must remain in the waiting room during your procedure. The hospital will cancel your procedure if you do not have a responsible adult with you.   1. Wear loose fitting clothing that is easily removed. 2. Leave jewelry and other valuables at home.  3. Remove all body piercing jewelry and leave at home. 4. Total time from sign-in until discharge is approximately 2-3 hours. 5. You should go home directly after your procedure and rest. You can resume normal activities the day after your procedure. 6. The day of your procedure you should not:  Drive  Make legal decisions  Operate machinery  Drink alcohol  Return to work   You may call the office (Dept: [phone]) before 5:00pm, or page the doctor on call [phone]) after 5:00pm, for further instructions, if necessary.   Insurance Information YOU WILL NEED TO CHECK WITH YOUR INSURANCE COMPANY FOR THE BENEFITS OF COVERAGE YOU HAVE FOR THIS PROCEDURE.  UNFORTUNATELY, NOT ALL INSURANCE COMPANIES HAVE BENEFITS TO COVER ALL OR PART OF THESE TYPES OF PROCEDURES.  IT IS YOUR RESPONSIBILITY TO CHECK YOUR BENEFITS, HOWEVER, WE WILL BE GLAD TO ASSIST YOU WITH ANY CODES YOUR INSURANCE COMPANY MAY NEED.    PLEASE NOTE THAT MOST INSURANCE COMPANIES WILL NOT COVER A SCREENING COLONOSCOPY FOR PEOPLE UNDER THE AGE OF 50  IF YOU HAVE BC[employer] INSURANCE, YOU MAY HAVE BENEFITS FOR A SCREENING COLONOSCOPY BUT IF POLYPS ARE FOUND THE DIAGNOSIS WILL CHANGE AND THEN YOU MAY HAVE A DEDUCTIBLE THAT WILL NEED TO BE MET. SO PLEASE MAKE SURE YOU CHECK YOUR BENEFITS FOR A SCREENING COLONOSCOPY AS WELL AS A DIAGNOSTIC COLONOSCOPY.

## 2017-06-17 NOTE — Progress Notes (Signed)
Hold iron X 7 days.

## 2017-06-17 NOTE — Progress Notes (Signed)
Letter mailed to the pt. 

## 2017-07-04 ENCOUNTER — Other Ambulatory Visit: Payer: Self-pay | Admitting: Pharmacist

## 2017-07-04 NOTE — Patient Outreach (Signed)
[place] [facility]) Care Management  07/04/2017  MISAKI SOZIO February 27, 1940 [id number]   Incoming call from Tammy Sours in response to the [facility] Medication Adherence Campaign. Speak with patient. HIPAA identifiers verified and verbal consent received.  Ms. Scaduto reports that she takes her lisinopril once daily as directed. Denies any missed doses or barriers to adherence. Counsel patient on the cost savings of using mail order pharmacy, but patient reports that she has tried mail order in the past and prefers to use her local pharmacy, [facility]. Let patient know about the over the counter benefit through [facility] and provide patient with this phone number.  Patient denies any further medication questions/concerns at this time. Provide patient with my phone number.  Will close pharmacy episode at this time.  Harlow Asa, PharmD, [facility] [phone]

## 2017-07-11 ENCOUNTER — Telehealth: Payer: Self-pay | Admitting: Gastroenterology

## 2017-07-11 NOTE — Telephone Encounter (Signed)
PATIENT CALLED AND SAID HER INSURANCE, [employer], WAS SUPPOSED TO CALL HERE AND MAKE SURE WE DID EVERYTHING WE NEEDED TO DO SO THEY WOULD COVER HER PROCEDURE, SHE WOULD LIKE SOMEONE TO CALL HER TO REASSURE HER THAT HAS BEEN TAKEN CARE OF BEFORE SHE HAS THE PROCEDURE.

## 2017-07-11 NOTE — Telephone Encounter (Signed)
Spoke with pt. Made her aware Almyra Free documented no PA was required. She just needs to call her insurance to see how much she is responsible for. Nothing further needed

## 2017-07-16 ENCOUNTER — Encounter (HOSPITAL_COMMUNITY): Payer: Self-pay | Admitting: *Deleted

## 2017-07-16 ENCOUNTER — Ambulatory Visit (HOSPITAL_COMMUNITY)
Admission: RE | Admit: 2017-07-16 | Discharge: 2017-07-16 | Disposition: A | Discharge status: Home / Self Care | Payer: Medicare HMO | Source: Ambulatory Visit | Attending: Gastroenterology | Admitting: Gastroenterology

## 2017-07-16 ENCOUNTER — Other Ambulatory Visit: Payer: Self-pay

## 2017-07-16 ENCOUNTER — Encounter (HOSPITAL_COMMUNITY)
Admission: RE | Disposition: A | Discharge status: Home / Self Care | Payer: Self-pay | Source: Ambulatory Visit | Attending: Gastroenterology

## 2017-07-16 DIAGNOSIS — I10 Essential (primary) hypertension: Secondary | ICD-10-CM | POA: Diagnosis not present

## 2017-07-16 DIAGNOSIS — Z1211 Encounter for screening for malignant neoplasm of colon: Secondary | ICD-10-CM

## 2017-07-16 DIAGNOSIS — Z8371 Family history of colonic polyps: Secondary | ICD-10-CM | POA: Diagnosis not present

## 2017-07-16 DIAGNOSIS — F17200 Nicotine dependence, unspecified, uncomplicated: Secondary | ICD-10-CM | POA: Insufficient documentation

## 2017-07-16 DIAGNOSIS — K648 Other hemorrhoids: Secondary | ICD-10-CM | POA: Insufficient documentation

## 2017-07-16 DIAGNOSIS — Z800 Family history of malignant neoplasm of digestive organs: Secondary | ICD-10-CM | POA: Insufficient documentation

## 2017-07-16 DIAGNOSIS — D123 Benign neoplasm of transverse colon: Secondary | ICD-10-CM | POA: Diagnosis not present

## 2017-07-16 DIAGNOSIS — D124 Benign neoplasm of descending colon: Secondary | ICD-10-CM | POA: Diagnosis not present

## 2017-07-16 DIAGNOSIS — Z79899 Other long term (current) drug therapy: Secondary | ICD-10-CM | POA: Insufficient documentation

## 2017-07-16 DIAGNOSIS — D120 Benign neoplasm of cecum: Secondary | ICD-10-CM

## 2017-07-16 DIAGNOSIS — Z7982 Long term (current) use of aspirin: Secondary | ICD-10-CM | POA: Insufficient documentation

## 2017-07-16 DIAGNOSIS — K644 Residual hemorrhoidal skin tags: Secondary | ICD-10-CM | POA: Diagnosis not present

## 2017-07-16 HISTORY — PX: POLYPECTOMY: SHX5525

## 2017-07-16 HISTORY — PX: COLONOSCOPY: SHX5424

## 2017-07-16 SURGERY — COLONOSCOPY
Anesthesia: Moderate Sedation

## 2017-07-16 MED ORDER — MIDAZOLAM HCL 5 MG/5ML IJ SOLN
INTRAMUSCULAR | Status: DC | PRN
  Administered 2017-07-16 (×2): 2 mg via INTRAVENOUS

## 2017-07-16 MED ORDER — MEPERIDINE HCL 100 MG/ML IJ SOLN
INTRAMUSCULAR | Status: DC | PRN
  Administered 2017-07-16 (×2): 25 mg via INTRAVENOUS

## 2017-07-16 MED ORDER — MIDAZOLAM HCL 5 MG/5ML IJ SOLN
INTRAMUSCULAR | Status: AC
  Filled 2017-07-16: qty 10

## 2017-07-16 MED ORDER — MEPERIDINE HCL 100 MG/ML IJ SOLN
INTRAMUSCULAR | Status: AC
  Filled 2017-07-16: qty 2

## 2017-07-16 MED ORDER — SODIUM CHLORIDE 0.9 % IV SOLN
INTRAVENOUS | Status: DC
  Administered 2017-07-16: 1000 mL via INTRAVENOUS

## 2017-07-16 MED ORDER — STERILE WATER FOR IRRIGATION IR SOLN
Status: DC | PRN
  Administered 2017-07-16: 2.5 mL

## 2017-07-16 NOTE — Op Note (Signed)
[facility] Patient Name: Melinda Larsen Procedure Date: 07/16/2017 10:37 AM MRN: [id number] Date of Birth: 1940-06-22 Attending MD: Barney Drain MD, MD CSN: [id number] Age: 77 Admit Type: Outpatient Procedure:                Colonoscopy WITH COLD FORCEPS/SNARE & SNARE CAUTERY                            POLYPECTOMY Indications:              Screening for colorectal malignant neoplasm Providers:                Barney Drain MD, MD, Lurline Del, RN, Nelma Rothman,                            Technician Referring MD:             Redmond School, MD Medicines:                Meperidine 50 mg IV, Midazolam 4 mg IV Complications:            No immediate complications. Estimated Blood Loss:     Estimated blood loss was minimal. Procedure:                Pre-Anesthesia Assessment:                           - Prior to the procedure, a History and Physical                            was performed, and patient medications and                            allergies were reviewed. The patient's tolerance of                            previous anesthesia was also reviewed. The risks                            and benefits of the procedure and the sedation                            options and risks were discussed with the patient.                            All questions were answered, and informed consent                            was obtained. Prior Anticoagulants: The patient has                            taken aspirin, last dose was 1 day prior to                            procedure. ASA Grade Assessment: II - A patient  with mild systemic disease. After reviewing the                            risks and benefits, the patient was deemed in                            satisfactory condition to undergo the procedure.                            After obtaining informed consent, the colonoscope                            was passed under direct vision. Throughout the                             procedure, the patient's blood pressure, pulse, and                            oxygen saturations were monitored continuously. The                            EC-[id number] ([id number]) scope was introduced through                            the anus and advanced to the the cecum, identified                            by appendiceal orifice and ileocecal valve. The                            colonoscopy was technically difficult and complex                            due to a redundant colon. Successful completion of                            the procedure was aided by straightening and                            shortening the scope to obtain bowel loop reduction                            and COLOWRAP. The patient tolerated the procedure                            well. The quality of the bowel preparation was                            good. The ileocecal valve, appendiceal orifice, and                            rectum were photographed. Scope In: 10:47:11 AM Scope Out: 11:20:12 AM Scope Withdrawal Time: 0 hours 28 minutes 35 seconds  Total Procedure Duration: 0 hours 33 minutes 1 second  Findings:      Six sessile polyps were found in the hepatic flexure and cecum. The       polyps were 4 to 7 mm in size. These polyps were removed with a cold       snare. Resection and retrieval were complete.      Three sessile polyps were found in the descending colon and hepatic       flexure. The polyps were 2 to 3 mm in size. These polyps were removed       with a cold biopsy forceps. Resection and retrieval were complete.      A 8 mm polyp was found in the splenic flexure. The polyp was sessile.       The polyp was removed with a hot snare. Resection and retrieval were       complete.      Internal hemorrhoids were found. The hemorrhoids were small.      External hemorrhoids were found. The hemorrhoids were moderate. Impression:               - Six 4 to 7 mm polyps at the hepatic  flexure and                            in the cecum, removed with a cold snare. Resected                            and retrieved.                           - Three 2 to 3 mm polyps in the descending colon                            and at the hepatic flexure, removed with a cold                            biopsy forceps. Resected and retrieved.                           - One 8 mm polyp at the splenic flexure, removed                            with a hot snare. Resected and retrieved.                           - Internal hemorrhoids.                           - External hemorrhoids. Moderate Sedation:      Moderate (conscious) sedation was administered by the endoscopy nurse       and supervised by the endoscopist. The following parameters were       monitored: oxygen saturation, heart rate, blood pressure, and response       to care. Total physician intraservice time was 44 minutes. Recommendation:           - Repeat colonoscopy is not recommended due to  current age (51 years or older) for surveillance.                           - High fiber diet.                           - Continue present medications.                           - Await pathology results.                           - Patient has a contact number available for                            emergencies. The signs and symptoms of potential                            delayed complications were discussed with the                            patient. Return to normal activities tomorrow.                            Written discharge instructions were provided to the                            patient. Procedure Code(s):        --- Professional ---                           [phone], Colonoscopy, flexible; with removal of                            tumor(s), polyp(s), or other lesion(s) by snare                            technique                           45380, 59, Colonoscopy, flexible; with biopsy,                             single or multiple                           G0500, Moderate sedation services provided by the                            same physician or other qualified health care                            professional performing a gastrointestinal                            endoscopic service that sedation supports,  requiring the presence of an independent trained                            observer to assist in the monitoring of the                            patient's level of consciousness and physiological                            status; initial 15 minutes of intra-service time;                            patient age 28 years or older (additional time may                            be reported with [phone], as appropriate)                           [phone], Moderate sedation services provided by the                            same physician or other qualified health care                            professional performing the diagnostic or                            therapeutic service that the sedation supports,                            requiring the presence of an independent trained                            observer to assist in the monitoring of the                            patient's level of consciousness and physiological                            status; each additional 15 minutes intraservice                            time (List separately in addition to code for                            primary service)                           [phone], Moderate sedation services provided by the                            same physician or other qualified health care  professional performing the diagnostic or                            therapeutic service that the sedation supports,                            requiring the presence of an independent trained                            observer to assist in the  monitoring of the                            patient's level of consciousness and physiological                            status; each additional 15 minutes intraservice                            time (List separately in addition to code for                            primary service) Diagnosis Code(s):        --- Professional ---                           Z12.11, Encounter for screening for malignant                            neoplasm of colon                           D12.0, Benign neoplasm of cecum                           D12.4, Benign neoplasm of descending colon                           D12.3, Benign neoplasm of transverse colon (hepatic                            flexure or splenic flexure)                           K64.4, Residual hemorrhoidal skin tags                           K64.8, Other hemorrhoids CPT copyright 2017 [employer]. All rights reserved. The codes documented in this report are preliminary and upon coder review may  be revised to meet current compliance requirements. Barney Drain, MD Barney Drain MD, MD 07/16/2017 11:43:37 AM This report has been signed electronically. Number of Addenda: 0

## 2017-07-16 NOTE — H&P (Signed)
Primary Care Physician:  Redmond School, MD Primary Gastroenterologist:  Dr. Oneida Alar  Pre-Procedure History & Physical: HPI:  Melinda Larsen is a 77 y.o. female here for [place].  Past Medical History:  Diagnosis Date  . Hypertension     Past Surgical History:  Procedure Laterality Date  . ABDOMINAL HYSTERECTOMY      Prior to Admission medications   Medication Sig Start Date End Date Taking? Authorizing Provider  amLODipine (NORVASC) 5 MG tablet Take 5 mg by mouth daily.   Yes [provider]  aspirin EC 81 MG tablet Take 81 mg by mouth daily.   Yes [provider]  ferrous sulfate (FEROSUL) 325 (65 FE) MG tablet Take 325 mg by mouth daily with breakfast.   Yes [provider]  lisinopril (PRINIVIL,ZESTRIL) 5 MG tablet Take 5 mg by mouth daily.   Yes [provider]  simvastatin (ZOCOR) 40 MG tablet Take 40 mg by mouth every evening.    Yes [provider]    Allergies as of 06/16/2017 - Review Complete 06/16/2017  Allergen Reaction Noted  . Codeine  08/16/2010    Family History  Problem Relation Age of Onset  . Hypertension Mother   . Hypertension Father   . Colon cancer Neg Hx   . Colon polyps Neg Hx     Social History   Socioeconomic History  . Marital status: Married    Spouse name: Not on file  . Number of children: Not on file  . Years of education: Not on file  . Highest education level: Not on file  Occupational History  . Not on file  Social Needs  . Financial resource strain: Not on file  . Food insecurity:    Worry: Not on file    Inability: Not on file  . Transportation needs:    Medical: Not on file    Non-medical: Not on file  Tobacco Use  . Smoking status: Current Some Day Smoker  . Smokeless tobacco: Never Used  Substance and Sexual Activity  . Alcohol use: No  . Drug use: No  . Sexual activity: Not on file  Lifestyle  . Physical activity:    Days per week: Not on file   Minutes per session: Not on file  . Stress: Not on file  Relationships  . Social connections:    Talks on phone: Not on file    Gets together: Not on file    Attends religious service: Not on file    Active member of club or organization: Not on file    Attends meetings of clubs or organizations: Not on file    Relationship status: Not on file  . Intimate partner violence:    Fear of current or ex partner: Not on file    Emotionally abused: Not on file    Physically abused: Not on file    Forced sexual activity: Not on file  Other Topics Concern  . Not on file  Social History Narrative  . Not on file    Review of Systems: See HPI, otherwise negative ROS   Physical Exam: BP (!) 136/50   Pulse (!) 58   Temp 98.9 F (37.2 C) (Oral)   Resp 18   Ht 5\' 10"  (1.778 m)   Wt 160 lb (72.6 kg)   SpO2 98%   BMI 22.96 kg/m  General:   Alert,  pleasant and cooperative in NAD Head:  Normocephalic and atraumatic. Neck:  Supple; Lungs:  Clear throughout to auscultation.    Heart:  Regular rate and rhythm. Abdomen:  Soft, nontender and nondistended. Normal bowel sounds, without guarding, and without rebound.   Neurologic:  Alert and  oriented x4;  grossly normal neurologically.  Impression/Plan:    SCREENING  Plan:  1. TCS TODAY DISCUSSED PROCEDURE, BENEFITS, & RISKS: < 1% chance of medication reaction, bleeding, perforation, or rupture of spleen/liver.

## 2017-07-16 NOTE — Discharge Instructions (Addendum)
You had 10 polyps removed. You have SMALL internal hemorrhoids.   DRINK WATER TO KEEP YOUR URINE LIGHT YELLOW.  FOLLOW A HIGH FIBER DIET. AVOID ITEMS THAT CAUSE BLOATING & GAS. SEE INFO BELOW.  YOUR BIOPSY RESULTS WILL BE AVAILABLE IN 7 days.  Next colonoscopy in 3-5 years if the benefits outweigh the risks.   YOUR SISTERS, BROTHERS, and CHILDREN NEED TO HAVE A COLONOSCOPY STARTING AT THE AGE OF 40.    Colonoscopy Care After Read the instructions outlined below and refer to this sheet in the next week. These discharge instructions provide you with general information on caring for yourself after you leave the hospital. While your treatment has been planned according to the most current medical practices available, unavoidable complications occasionally occur. If you have any problems or questions after discharge, call DR. Sibbie Flammia, [phone].  ACTIVITY  You may resume your regular activity, but move at a slower pace for the next 24 hours.   Take frequent rest periods for the next 24 hours.   Walking will help get rid of the air and reduce the bloated feeling in your belly (abdomen).   No driving for 24 hours (because of the medicine (anesthesia) used during the test).   You may shower.   Do not sign any important legal documents or operate any machinery for 24 hours (because of the anesthesia used during the test).    NUTRITION  Drink plenty of fluids.   You may resume your normal diet as instructed by your doctor.   Begin with a light meal and progress to your normal diet. Heavy or fried foods are harder to digest and may make you feel sick to your stomach (nauseated).   Avoid alcoholic beverages for 24 hours or as instructed.    MEDICATIONS  You may resume your normal medications.   WHAT YOU CAN EXPECT TODAY  Some feelings of bloating in the abdomen.   Passage of more gas than usual.   Spotting of blood in your stool or on the toilet paper  .  IF YOU HAD  POLYPS REMOVED DURING THE COLONOSCOPY:  Eat a soft diet IF YOU HAVE NAUSEA, BLOATING, ABDOMINAL PAIN, OR VOMITING.    FINDING OUT THE RESULTS OF YOUR TEST Not all test results are available during your visit. DR. Oneida Alar WILL CALL YOU WITHIN 14 DAYS OF YOUR PROCEDUE WITH YOUR RESULTS. Do not assume everything is normal if you have not heard from DR. Rendi Mapel, CALL HER OFFICE AT [phone].  SEEK IMMEDIATE MEDICAL ATTENTION AND CALL THE OFFICE: [phone] IF:  You have more than a spotting of blood in your stool.   Your belly is swollen (abdominal distention).   You are nauseated or vomiting.   You have a temperature over 101F.   You have abdominal pain or discomfort that is severe or gets worse throughout the day.   High-Fiber Diet A high-fiber diet changes your normal diet to include more whole grains, legumes, fruits, and vegetables. Changes in the diet involve replacing refined carbohydrates with unrefined foods. The calorie level of the diet is essentially unchanged. The Dietary Reference Intake (recommended amount) for adult males is 38 grams per day. For adult females, it is 25 grams per day. Pregnant and lactating women should consume 28 grams of fiber per day. Fiber is the intact part of a plant that is not broken down during digestion. Functional fiber is fiber that has been isolated from the plant to provide a beneficial effect in the body.  PURPOSE  Increase stool bulk.   Ease and regulate bowel movements.   Lower cholesterol.   REDUCE RISK OF COLON CANCER  INDICATIONS THAT YOU NEED MORE FIBER  Constipation and hemorrhoids.   Uncomplicated diverticulosis (intestine condition) and irritable bowel syndrome.   Weight management.   As a protective measure against hardening of the arteries (atherosclerosis), diabetes, and cancer.   GUIDELINES FOR INCREASING FIBER IN THE DIET  Start adding fiber to the diet slowly. A gradual increase of about 5 more grams (2 slices  of whole-wheat bread, 2 servings of most fruits or vegetables, or 1 bowl of high-fiber cereal) per day is best. Too rapid an increase in fiber may result in constipation, flatulence, and bloating.   Drink enough water and fluids to keep your urine clear or pale yellow. Water, juice, or caffeine-free drinks are recommended. Not drinking enough fluid may cause constipation.   Eat a variety of high-fiber foods rather than one type of fiber.   Try to increase your intake of fiber through using high-fiber foods rather than fiber pills or supplements that contain small amounts of fiber.   The goal is to change the types of food eaten. Do not supplement your present diet with high-fiber foods, but replace foods in your present diet.   INCLUDE A VARIETY OF FIBER SOURCES  Replace refined and processed grains with whole grains, canned fruits with fresh fruits, and incorporate other fiber sources. White rice, white breads, and most bakery goods contain little or no fiber.   Brown whole-grain rice, buckwheat oats, and many fruits and vegetables are all good sources of fiber. These include: broccoli, Brussels sprouts, cabbage, cauliflower, beets, sweet potatoes, white potatoes (skin on), carrots, tomatoes, eggplant, squash, berries, fresh fruits, and dried fruits.   Cereals appear to be the richest source of fiber. Cereal fiber is found in whole grains and bran. Bran is the fiber-rich outer coat of cereal grain, which is largely removed in refining. In whole-grain cereals, the bran remains. In breakfast cereals, the largest amount of fiber is found in those with "bran" in their names. The fiber content is sometimes indicated on the label.   You may need to include additional fruits and vegetables each day.   In baking, for 1 cup white flour, you may use the following substitutions:   1 cup whole-wheat flour minus 2 tablespoons.   1/2 cup white flour plus 1/2 cup whole-wheat flour.   Polyps, Colon  A  polyp is extra tissue that grows inside your body. Colon polyps grow in the large intestine. The large intestine, also called the colon, is part of your digestive system. It is a long, hollow tube at the end of your digestive tract where your body makes and stores stool. Most polyps are not dangerous. They are benign. This means they are not cancerous. But over time, some types of polyps can turn into cancer. Polyps that are smaller than a pea are usually not harmful. But larger polyps could someday become or may already be cancerous. To be safe, doctors remove all polyps and test them.   WHO GETS POLYPS? Anyone can get polyps, but certain people are more likely than others. You may have a greater chance of getting polyps if:  You are over 50.   You have had polyps before.   Someone in your family has had polyps.   Someone in your family has had cancer of the large intestine.   Find out if someone in your family  has had polyps. You may also be more likely to get polyps if you:   Eat a lot of fatty foods   Smoke   Drink alcohol   Do not exercise  Eat too much   PREVENTION There is not one sure way to prevent polyps. You might be able to lower your risk of getting them if you:  Eat more fruits and vegetables and less fatty food.   Do not smoke.   Avoid alcohol.   Exercise every day.   Lose weight if you are overweight.   Eating more calcium and folate can also lower your risk of getting polyps. Some foods that are rich in calcium are milk, cheese, and broccoli. Some foods that are rich in folate are chickpeas, kidney beans, and spinach.   Hemorrhoids Hemorrhoids are dilated (enlarged) veins around the rectum. Sometimes clots will form in the veins. This makes them swollen and painful. These are called thrombosed hemorrhoids. Causes of hemorrhoids include:  Constipation.   Straining to have a bowel movement.   HEAVY LIFTING  HOME CARE INSTRUCTIONS  Eat a well balanced  diet and drink 6 to 8 glasses of water every day to avoid constipation. You may also use a bulk laxative.   Avoid straining to have bowel movements.   Keep anal area dry and clean.   Do not use a donut shaped pillow or sit on the toilet for long periods. This increases blood pooling and pain.   Move your bowels when your body has the urge; this will require less straining and will decrease pain and pressure.

## 2017-07-19 ENCOUNTER — Telehealth: Payer: Self-pay | Admitting: Gastroenterology

## 2017-07-19 NOTE — Telephone Encounter (Signed)
  Please call pt. She had TEN simple adenomas removed.   DRINK WATER TO KEEP YOUR URINE LIGHT YELLOW.  FOLLOW A HIGH FIBER DIET. AVOID ITEMS THAT CAUSE BLOATING & GAS.   Next colonoscopy in 5 years if the benefits outweigh the risks.   YOUR SISTERS, BROTHERS, and CHILDREN NEED TO HAVE A COLONOSCOPY STARTING AT THE AGE OF 40.

## 2017-07-21 NOTE — Telephone Encounter (Signed)
Lmom, waiting on a return call.  

## 2017-07-21 NOTE — Telephone Encounter (Signed)
Pt notified of results

## 2017-07-21 NOTE — Telephone Encounter (Signed)
ON RECALL  °

## 2017-07-24 ENCOUNTER — Encounter (HOSPITAL_COMMUNITY): Payer: Self-pay | Admitting: Gastroenterology

## 2017-07-29 DIAGNOSIS — E611 Iron deficiency: Secondary | ICD-10-CM | POA: Diagnosis not present

## 2017-10-27 DIAGNOSIS — J042 Acute laryngotracheitis: Secondary | ICD-10-CM | POA: Diagnosis not present

## 2017-10-27 DIAGNOSIS — Z6822 Body mass index (BMI) 22.0-22.9, adult: Secondary | ICD-10-CM | POA: Diagnosis not present

## 2017-10-27 DIAGNOSIS — Z23 Encounter for immunization: Secondary | ICD-10-CM | POA: Diagnosis not present

## 2017-10-27 DIAGNOSIS — I10 Essential (primary) hypertension: Secondary | ICD-10-CM | POA: Diagnosis not present

## 2017-10-27 DIAGNOSIS — E1140 Type 2 diabetes mellitus with diabetic neuropathy, unspecified: Secondary | ICD-10-CM | POA: Diagnosis not present

## 2017-10-27 DIAGNOSIS — E782 Mixed hyperlipidemia: Secondary | ICD-10-CM | POA: Diagnosis not present

## 2017-10-27 DIAGNOSIS — Z1389 Encounter for screening for other disorder: Secondary | ICD-10-CM | POA: Diagnosis not present

## 2017-10-27 DIAGNOSIS — J329 Chronic sinusitis, unspecified: Secondary | ICD-10-CM | POA: Diagnosis not present

## 2017-10-27 DIAGNOSIS — K219 Gastro-esophageal reflux disease without esophagitis: Secondary | ICD-10-CM | POA: Diagnosis not present

## 2017-11-06 ENCOUNTER — Other Ambulatory Visit (HOSPITAL_COMMUNITY): Payer: Self-pay | Admitting: Internal Medicine

## 2017-11-06 DIAGNOSIS — E2839 Other primary ovarian failure: Secondary | ICD-10-CM

## 2017-11-16 ENCOUNTER — Emergency Department (HOSPITAL_COMMUNITY): Payer: Medicare HMO

## 2017-11-16 ENCOUNTER — Encounter (HOSPITAL_COMMUNITY): Payer: Self-pay | Admitting: Emergency Medicine

## 2017-11-16 ENCOUNTER — Emergency Department (HOSPITAL_COMMUNITY)
Admission: EM | Admit: 2017-11-16 | Discharge: 2017-11-16 | Disposition: A | Discharge status: Home / Self Care | Payer: Medicare HMO | Attending: Emergency Medicine | Admitting: Emergency Medicine

## 2017-11-16 ENCOUNTER — Other Ambulatory Visit: Payer: Self-pay

## 2017-11-16 DIAGNOSIS — I10 Essential (primary) hypertension: Secondary | ICD-10-CM | POA: Insufficient documentation

## 2017-11-16 DIAGNOSIS — R05 Cough: Secondary | ICD-10-CM | POA: Diagnosis not present

## 2017-11-16 DIAGNOSIS — F17210 Nicotine dependence, cigarettes, uncomplicated: Secondary | ICD-10-CM | POA: Diagnosis not present

## 2017-11-16 DIAGNOSIS — R059 Cough, unspecified: Secondary | ICD-10-CM

## 2017-11-16 DIAGNOSIS — Z7982 Long term (current) use of aspirin: Secondary | ICD-10-CM | POA: Diagnosis not present

## 2017-11-16 DIAGNOSIS — R918 Other nonspecific abnormal finding of lung field: Secondary | ICD-10-CM | POA: Insufficient documentation

## 2017-11-16 DIAGNOSIS — R0602 Shortness of breath: Secondary | ICD-10-CM | POA: Diagnosis present

## 2017-11-16 DIAGNOSIS — Z79899 Other long term (current) drug therapy: Secondary | ICD-10-CM | POA: Insufficient documentation

## 2017-11-16 HISTORY — DX: Prediabetes: R73.03

## 2017-11-16 HISTORY — DX: Hyperlipidemia, unspecified: E78.5

## 2017-11-16 LAB — CBC WITH DIFFERENTIAL/PLATELET
ABS IMMATURE GRANULOCYTES: 0.01 10*3/uL (ref 0.00–0.07)
BASOS ABS: 0 10*3/uL (ref 0.0–0.1)
BASOS PCT: 0 %
Eosinophils Absolute: 0.1 10*3/uL (ref 0.0–0.5)
Eosinophils Relative: 2 %
HCT: 44.1 % (ref 36.0–46.0)
HEMOGLOBIN: 13.5 g/dL (ref 12.0–15.0)
Immature Granulocytes: 0 %
LYMPHS PCT: 16 %
Lymphs Abs: 0.7 10*3/uL (ref 0.7–4.0)
MCH: 26.4 pg (ref 26.0–34.0)
MCHC: 30.6 g/dL (ref 30.0–36.0)
MCV: 86.3 fL (ref 80.0–100.0)
Monocytes Absolute: 0.6 10*3/uL (ref 0.1–1.0)
Monocytes Relative: 12 %
NEUTROS ABS: 3.2 10*3/uL (ref 1.7–7.7)
NEUTROS PCT: 70 %
NRBC: 0 % (ref 0.0–0.2)
PLATELETS: 301 10*3/uL (ref 150–400)
RBC: 5.11 MIL/uL (ref 3.87–5.11)
RDW: 12.9 % (ref 11.5–15.5)
WBC: 4.6 10*3/uL (ref 4.0–10.5)

## 2017-11-16 LAB — COMPREHENSIVE METABOLIC PANEL
ALBUMIN: 4.2 g/dL (ref 3.5–5.0)
ALT: 10 U/L (ref 0–44)
ANION GAP: 7 (ref 5–15)
AST: 19 U/L (ref 15–41)
Alkaline Phosphatase: 51 U/L (ref 38–126)
BUN: 11 mg/dL (ref 8–23)
CHLORIDE: 104 mmol/L (ref 98–111)
CO2: 26 mmol/L (ref 22–32)
Calcium: 9.4 mg/dL (ref 8.9–10.3)
Creatinine, Ser: 0.6 mg/dL (ref 0.44–1.00)
GFR calc Af Amer: >60 mL/min (ref >60)
GFR calc non Af Amer: >60 mL/min (ref >60)
GLUCOSE: 101 mg/dL — AB (ref 70–99)
Potassium: 3.6 mmol/L (ref 3.5–5.1)
SODIUM: 137 mmol/L (ref 135–145)
Total Bilirubin: 1 mg/dL (ref 0.3–1.2)
Total Protein: 8.2 g/dL — ABNORMAL HIGH (ref 6.5–8.1)

## 2017-11-16 LAB — TROPONIN I: Troponin I: <0.03 ng/mL (ref <0.03)

## 2017-11-16 MED ORDER — ALBUTEROL SULFATE HFA 108 (90 BASE) MCG/ACT IN AERS: 2.0000 | INHALATION_SPRAY | RESPIRATORY_TRACT | 0 refills | Status: DC | PRN

## 2017-11-16 MED ORDER — ALBUTEROL SULFATE (2.5 MG/3ML) 0.083% IN NEBU: 5.0000 mg | INHALATION_SOLUTION | Freq: Once | RESPIRATORY_TRACT | Status: DC

## 2017-11-16 MED ORDER — IOPAMIDOL (ISOVUE-370) INJECTION 76%
100.0000 mL | Freq: Once | INTRAVENOUS | Status: AC | PRN
  Administered 2017-11-16: 100 mL via INTRAVENOUS

## 2017-11-16 MED ORDER — HYDROCODONE-ACETAMINOPHEN 5-325 MG PO TABS: ORAL_TABLET | ORAL | 0 refills | Status: DC

## 2017-11-16 MED ORDER — IPRATROPIUM-ALBUTEROL 0.5-2.5 (3) MG/3ML IN SOLN
3.0000 mL | Freq: Once | RESPIRATORY_TRACT | Status: AC
  Administered 2017-11-16: 3 mL via RESPIRATORY_TRACT
  Filled 2017-11-16: qty 3

## 2017-11-16 MED ORDER — BENZONATATE 100 MG PO CAPS: 100.0000 mg | ORAL_CAPSULE | Freq: Three times a day (TID) | ORAL | 0 refills | Status: DC | PRN

## 2017-11-16 NOTE — ED Triage Notes (Signed)
Pt reports persistent, productive cough for months. States sometimes it'll get better for a few days, then will come back. States yesterday she began feeling some chest tightness and SOB. Denies fevers.

## 2017-11-16 NOTE — Discharge Instructions (Addendum)
Take the prescriptions as directed. 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 Left hilar adenopathy." The Oncologist's office has your information and will call you tomorrow to schedule a follow up appointment these findings. If you do not hear from them, call their office (their information is given to you today).  Call your regular medical doctor tomorrow morning to schedule a follow up appointment within the next 2 days.  Return to the Emergency Department immediately sooner if worsening.

## 2017-11-16 NOTE — ED Notes (Signed)
Respiratory contacted for nebulizer treatment.

## 2017-11-16 NOTE — ED Provider Notes (Signed)
[facility] EMERGENCY DEPARTMENT Provider Note   CSN: [id number] Arrival date & time: 11/16/17  1209     History   Chief Complaint Chief Complaint  Patient presents with  . Shortness of Breath    HPI Melinda Larsen is a 77 y.o. female.  HPI  Pt was seen at 1235. Per pt, c/o gradual onset and persistence of constant cough for the past 2 days. Has been associated with constant right upper chest "tightness" and SOB.  Pt states she woke up with symptoms 2 days ago. Pt states she has had a cough on and off "for months" and states her PMD gave her "a shot and something for congestion" approximately 1 month ago. States other family members have had a cough this past week also. Pt has received her flu shot. Denies palpitations, no fevers, no back pain, no abd pain, no N/V/D, no calf/LE's pain or unilateral swelling.     Past Medical History:  Diagnosis Date  . Borderline diabetes   . Hyperlipidemia   . Hypertension     Patient Active Problem List   Diagnosis Date Noted  . Special screening for malignant neoplasms, colon     Past Surgical History:  Procedure Laterality Date  . ABDOMINAL HYSTERECTOMY    . COLONOSCOPY N/A 07/16/2017   Procedure: COLONOSCOPY;  Surgeon: Danie Binder, MD;  [facility];  Service: Endoscopy;  Laterality: N/A;  10:30  . POLYPECTOMY  07/16/2017   Procedure: POLYPECTOMY;  Surgeon: Danie Binder, MD;  Location: [facility];  Service: Endoscopy;;  cecal x2;hepatic flexurex5; splenic x1;descending x2     OB History   None      Home Medications    Prior to Admission medications   Medication Sig Start Date End Date Taking? Authorizing Provider  amLODipine (NORVASC) 5 MG tablet Take 5 mg by mouth daily.    [provider]  aspirin EC 81 MG tablet Take 81 mg by mouth daily.    [provider]  lisinopril (PRINIVIL,ZESTRIL) 5 MG tablet Take 5 mg by mouth daily.    [provider]  simvastatin (ZOCOR) 40 MG  tablet Take 40 mg by mouth every evening.     [provider]    Family History Family History  Problem Relation Age of Onset  . Hypertension Mother   . Hypertension Father   . Colon cancer Neg Hx   . Colon polyps Neg Hx     Social History Social History   Tobacco Use  . Smoking status: Current Some Day Smoker    Packs/day: 0.50    Types: Cigarettes  . Smokeless tobacco: Never Used  Substance Use Topics  . Alcohol use: No  . Drug use: No     Allergies   Codeine   Review of Systems Review of Systems ROS: Statement: All systems negative except as marked or noted in the HPI; Constitutional: Negative for fever and chills. ; ; Eyes: Negative for eye pain, redness and discharge. ; ; ENMT: Negative for ear pain, hoarseness, nasal congestion, sinus pressure and sore throat. ; ; Cardiovascular: Negative for palpitations, diaphoresis, and peripheral edema. ; ; Respiratory: +cough, chest tightness, SOB. Negative for wheezing and stridor. ; ; Gastrointestinal: Negative for nausea, vomiting, diarrhea, abdominal pain, blood in stool, hematemesis, jaundice and rectal bleeding. . ; ; Genitourinary: Negative for dysuria, flank pain and hematuria. ; ; Musculoskeletal: Negative for back pain and neck pain. Negative for swelling and trauma.; ; Skin: Negative  for pruritus, rash, abrasions, blisters, bruising and skin lesion.; ; Neuro: Negative for headache, lightheadedness and neck stiffness. Negative for weakness, altered level of consciousness, altered mental status, extremity weakness, paresthesias, involuntary movement, seizure and syncope.       Physical Exam Updated Vital Signs BP (!) 196/70 (BP Location: Right Arm)   Pulse 74   Temp 98 F (36.7 C) (Oral)   Resp 20   Ht 5\' 10"  (1.778 m)   Wt 68 kg   SpO2 98%   BMI 21.52 kg/m   Physical Exam 1240: Physical examination:  Nursing notes reviewed; Vital signs and O2 SAT reviewed;  Constitutional: Well developed, Well  nourished, Well hydrated, In no acute distress; Head:  Normocephalic, atraumatic; Eyes: EOMI, PERRL, No scleral icterus; ENMT: TM's clear bilat. Mouth and pharynx without lesions. No tonsillar exudates. No intra-oral edema. No submandibular or sublingual edema. No hoarse voice, no drooling, no stridor. No trismus. Mouth and pharynx normal, Mucous membranes moist; Neck: Supple, Full range of motion, No lymphadenopathy; Cardiovascular: Regular rate and rhythm, No gallop; Respiratory: Breath sounds coarse & equal bilaterally, No wheezes.  Speaking full sentences with ease, Normal respiratory effort/excursion; Chest: Nontender, Movement normal; Abdomen: Soft, Nontender, Nondistended, Normal bowel sounds; Genitourinary: No CVA tenderness; Extremities: Peripheral pulses normal, No tenderness, No edema, No calf edema or asymmetry.; Neuro: AA&Ox3, Major CN grossly intact.  Speech clear. No gross focal motor or sensory deficits in extremities.; Skin: Color normal, Warm, Dry.    ED Treatments / Results  Labs (all labs ordered are listed, but only abnormal results are displayed)   EKG EKG Interpretation  Date/Time:  Sunday November 16 2017 12:22:23 EDT Ventricular Rate:  67 PR Interval:    QRS Duration: 144 QT Interval:  434 QTC Calculation: 459 R Axis:   95 Text Interpretation:  Sinus arrhythmia Right bundle branch block Baseline wander No old tracing to compare Confirmed by Francine Graven [phone]) on 11/16/2017 12:40:11 PM   Radiology   Procedures Procedures (including critical care time)  Medications Ordered in ED Medications  ipratropium-albuterol (DUONEB) 0.5-2.5 (3) MG/3ML nebulizer solution 3 mL (has no administration in time range)     Initial Impression / Assessment and Plan / ED Course  I have reviewed the triage vital signs and the nursing notes.  Pertinent labs & imaging results that were available during my care of the patient were reviewed by me and considered in my medical  decision making (see chart for details).  MDM Reviewed: previous chart, nursing note and vitals Reviewed previous: labs Interpretation: labs, ECG and x-ray   Results for orders placed or performed during the hospital encounter of 11/16/17  CBC with Differential  Result Value Ref Range   WBC 4.6 4.0 - 10.5 K/uL   RBC 5.11 3.87 - 5.11 MIL/uL   Hemoglobin 13.5 12.0 - 15.0 g/dL   HCT 44.1 36.0 - 46.0 %   MCV 86.3 80.0 - 100.0 fL   MCH 26.4 26.0 - 34.0 pg   MCHC 30.6 30.0 - 36.0 g/dL   RDW 12.9 11.5 - 15.5 %   Platelets 301 150 - 400 K/uL   nRBC 0.0 0.0 - 0.2 %   Neutrophils Relative % 70 %   Neutro Abs 3.2 1.7 - 7.7 K/uL   Lymphocytes Relative 16 %   Lymphs Abs 0.7 0.7 - 4.0 K/uL   Monocytes Relative 12 %   Monocytes Absolute 0.6 0.1 - 1.0 K/uL   Eosinophils Relative 2 %   Eosinophils Absolute 0.1  0.0 - 0.5 K/uL   Basophils Relative 0 %   Basophils Absolute 0.0 0.0 - 0.1 K/uL   Immature Granulocytes 0 %   Abs Immature Granulocytes 0.01 0.00 - 0.07 K/uL  Comprehensive metabolic panel  Result Value Ref Range   Sodium 137 135 - 145 mmol/L   Potassium 3.6 3.5 - 5.1 mmol/L   Chloride 104 98 - 111 mmol/L   CO2 26 22 - 32 mmol/L   Glucose, Bld 101 (H) 70 - 99 mg/dL   BUN 11 8 - 23 mg/dL   Creatinine, Ser 0.60 0.44 - 1.00 mg/dL   Calcium 9.4 8.9 - 10.3 mg/dL   Total Protein 8.2 (H) 6.5 - 8.1 g/dL   Albumin 4.2 3.5 - 5.0 g/dL   AST 19 15 - 41 U/L   ALT 10 0 - 44 U/L   Alkaline Phosphatase 51 38 - 126 U/L   Total Bilirubin 1.0 0.3 - 1.2 mg/dL   GFR calc non Af Amer >60 >60 mL/min   GFR calc Af Amer >60 >60 mL/min   Anion gap 7 5 - 15  Troponin I  Result Value Ref Range   Troponin I <0.03 <0.03 ng/mL   Dg Chest 2 View Result Date: 11/16/2017 CLINICAL DATA:  77 year old with shortness of breath. EXAM: CHEST - 2 VIEW COMPARISON:  None. FINDINGS: 2.3 cm round opacity in the mid/anterior aspect of the chest on the lateral view. Not exactly sure where this opacity is located on  the PA view but may be in the medial right chest. Prominent hilar structures, left side greater than right. Few prominent branching densities in left upper lung are nonspecific. Heart size is normal. No large pleural effusions. Bone structures are unremarkable. IMPRESSION: 2.3 cm round opacity in the anterior chest. Findings are concerning for a large pulmonary nodule. Questionable densities in the left upper lobe. Recommend a chest CT with IV contrast to exclude a neoplastic process. Electronically Signed   By: Markus Daft M.D.   On: 11/16/2017 13:40   Ct Angio Chest Pe W/cm &/or Wo Cm Result Date: 11/16/2017 CLINICAL DATA:  Patient with productive cough. EXAM: CT ANGIOGRAPHY CHEST WITH CONTRAST TECHNIQUE: Multidetector CT imaging of the chest was performed using the standard protocol during bolus administration of intravenous contrast. Multiplanar CT image reconstructions and MIPs were obtained to evaluate the vascular anatomy. CONTRAST:  131mL ISOVUE-370 IOPAMIDOL (ISOVUE-370) INJECTION 76% COMPARISON:  Chest radiograph 11/16/2017 FINDINGS: Cardiovascular: Normal heart size. Trace fluid superior pericardial recess. Thoracic aortic vascular calcifications. Contrast opacifies the pulmonary arterial system. No evidence for acute pulmonary embolus. Left upper lobe mass narrows the left upper lobe pulmonary arteries. Mediastinum/Nodes: There is a 4.5 x 4.5 cm left suprahilar soft tissue mass which infiltrates into a portion of the superior mediastinum. There is a 1.5 cm left hilar node (image 52; series 6). No axillary adenopathy. Normal appearance of the esophagus. Lungs/Pleura: Central airways are patent. Centrilobular and paraseptal emphysematous change. Large left suprahilar/left upper lobe mass. Multiple small nodules are demonstrated surrounding the left upper lobe mass. Additionally there is a 1.1 cm left upper lobe nodule (image 36; series 8). A 0.9 cm left upper lobe nodule (image 39; series 8). A 2.2 cm  left upper lobe nodule (image 63; series 8). A 1.3 cm left lower lobe nodule (image 109; series 8). A 0.5 cm right lower lobe nodule (image 59; series 8). A 1.2 cm right upper lobe nodule (image 65; series 8). Multiple additional small nodules are demonstrated  throughout the lungs bilaterally. No pleural effusion or pneumothorax. Upper Abdomen: Tiny hyperenhancing regions within the left and right hepatic lobes (image 103; series 6) (image 89; series 6), likely small perfusion anomalies. Indeterminate 2.1 cm left adrenal nodule (image 99; series 6). Musculoskeletal: No aggressive or acute appearing osseous lesions. Review of the MIP images confirms the above findings. IMPRESSION: 1. 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 There are multiple adjacent nodules within the left upper lobe as well as scattered nodules throughout the lungs bilaterally concerning for metastatic disease. Left hilar adenopathy concerning for metastatic disease. 2. Indeterminate left adrenal nodule. 3. No evidence for acute pulmonary embolus. 4. Aortic Atherosclerosis (ICD10-I70.0) and Emphysema (ICD10-J43.9). Electronically Signed   By: Lovey Newcomer M.D.   On: 11/16/2017 15:48    1605:  Doubt PE as cause for symptoms with negative CT-A chest.  Doubt ACS as cause for symptoms with normal troponin and EKG without acute STTW changes after 2 days of constant atypical symptoms.   Pt given short neb with improvement in coughing. Pt ambulated with O2 Sats remaining 95<%. CT as above.  T/C returned from [facility] Oncologist Dr. Delton Coombes, case discussed, including:  HPI, pertinent PM/SHx, VS/PE, dx testing, ED course and treatment:  Agreeable to f/u in office, requests to send pt's info Epic in-basket and office will call pt tomorrow to schedule f/u. Dx and testing, as well as d/w Onc MD, d/w pt and family.  Questions answered.  Verb understanding, agreeable to d/c home with outpt f/u.     Final Clinical  Impressions(s) / ED Diagnoses   Final diagnoses:  None    ED Discharge Orders    None       Francine Graven, DO 11/20/17 [id number]

## 2017-11-18 ENCOUNTER — Encounter (HOSPITAL_COMMUNITY): Payer: Self-pay | Admitting: Internal Medicine

## 2017-11-18 ENCOUNTER — Other Ambulatory Visit: Payer: Self-pay

## 2017-11-18 ENCOUNTER — Inpatient Hospital Stay (HOSPITAL_COMMUNITY): Payer: 59 | Attending: Internal Medicine | Admitting: Internal Medicine

## 2017-11-18 DIAGNOSIS — E278 Other specified disorders of adrenal gland: Secondary | ICD-10-CM

## 2017-11-18 DIAGNOSIS — I10 Essential (primary) hypertension: Secondary | ICD-10-CM

## 2017-11-18 DIAGNOSIS — F17210 Nicotine dependence, cigarettes, uncomplicated: Secondary | ICD-10-CM

## 2017-11-18 DIAGNOSIS — R0602 Shortness of breath: Secondary | ICD-10-CM | POA: Diagnosis not present

## 2017-11-18 DIAGNOSIS — R634 Abnormal weight loss: Secondary | ICD-10-CM

## 2017-11-18 DIAGNOSIS — I700 Atherosclerosis of aorta: Secondary | ICD-10-CM

## 2017-11-18 DIAGNOSIS — R911 Solitary pulmonary nodule: Secondary | ICD-10-CM

## 2017-11-18 DIAGNOSIS — R51 Headache: Secondary | ICD-10-CM

## 2017-11-18 DIAGNOSIS — R05 Cough: Secondary | ICD-10-CM | POA: Diagnosis not present

## 2017-11-18 NOTE — Progress Notes (Signed)
Referring Physician:  Dr. Stann Mainland,  ED physicians  Diagnosis Solitary pulmonary nodule - Plan: NM PET Image Initial (PI) Skull Base To Thigh, MR Brain W Wo Contrast  Staging Cancer Staging No matching staging information was found for the patient.  Assessment and Plan:  1.  Lung mass.  Pt had CTA of the chest on 11/16/2017 with findings of  IMPRESSION: 1. 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 There are multiple adjacent nodules within the left upper lobe as well as scattered nodules throughout the lungs bilaterally concerning for metastatic disease. Left hilar adenopathy concerning for metastatic disease. 2. Indeterminate left adrenal nodule. 3. No evidence for acute pulmonary embolus. 4. Aortic Atherosclerosis (ICD10-I70.0) and Emphysema (ICD10-J43.9).  Pt has not undergone biopsy yet.  She reports Cough, weight loss of 10 pounds over past month.  Pt is seen today for Consultation due to abnormal scan.  She will be referred to CT surgery or IR for biopsy.  Will facilitate this ASAP due to CTA findings.  She will be set up for PET scan and MRI of brain to complete staging evaluation.  I have discussed with her she will RTC once biopsy results have been reviewed.  Suspect lung cancer.  All questions answered and pt expressed understanding of the information presented.   2.  Headache.  Pt will undergo MRI of brain due to advanced findings suspicious for malignancy on CTA.    3.  HTN.  BP is 169/52.  Follow-up with PCP.    4.  SOB.  Pulse ox is 97% on room air.  Pt is not in respiratory distress.  CTA negative for PE.  Likely due to lung findings on CTA.    Greater than 40 minutes spent with review of records, counseling and coordination of care.    HPI:  77 yr old female presented to ED due to shortness of breath.  She has a long smoking history.  She underwent CTA of the chest on 11/16/2017 with findings of  IMPRESSION: 1. Large left suprahilar mass  concerning for primary pulmonary malignancy. There are multiple adjacent nodules within the left upper lobe as well as scattered nodules throughout the lungs bilaterally concerning for metastatic disease. Left hilar adenopathy concerning for metastatic disease. 2. Indeterminate left adrenal nodule. 3. No evidence for acute pulmonary embolus. 4. Aortic Atherosclerosis (ICD10-I70.0) and Emphysema (ICD10-J43.9).  Pt has not undergone biopsy yet.  She reports Cough, weight loss of 10 pounds over past month.  Pt is seen today for Consultation due to abnormal scan.    Problem List Patient Active Problem List   Diagnosis Date Noted  . Special screening for malignant neoplasms, colon [Z12.11]     Past Medical History Past Medical History:  Diagnosis Date  . Borderline diabetes   . Hyperlipidemia   . Hypertension     Past Surgical History Past Surgical History:  Procedure Laterality Date  . ABDOMINAL HYSTERECTOMY    . COLONOSCOPY N/A 07/16/2017   Procedure: COLONOSCOPY;  Surgeon: Danie Binder, MD;  [facility];  Service: Endoscopy;  Laterality: N/A;  10:30  . POLYPECTOMY  07/16/2017   Procedure: POLYPECTOMY;  Surgeon: Danie Binder, MD;  [facility];  Service: Endoscopy;;  cecal x2;hepatic flexurex5; splenic x1;descending x2    Family History Family History  Problem Relation Age of Onset  . Hypertension Mother   . Hypertension Father   . Colon cancer Neg Hx   . Colon polyps Neg Hx  Social History  reports that she has been smoking cigarettes. She has been smoking about 0.50 packs per day. She has never used smokeless tobacco. She reports that she does not drink alcohol or use drugs.  Medications  Current Outpatient Medications:  .  albuterol (PROVENTIL HFA;VENTOLIN HFA) 108 (90 Base) MCG/ACT inhaler, Inhale 2 puffs into the lungs every 4 (four) hours as needed for wheezing or shortness of breath (or coughing)., Disp: 1 Inhaler, Rfl: 0 .   amLODipine (NORVASC) 5 MG tablet, Take 5 mg by mouth daily., Disp: , Rfl:  .  aspirin EC 81 MG tablet, Take 81 mg by mouth daily., Disp: , Rfl:  .  benzonatate (TESSALON) 100 MG capsule, Take 1 capsule (100 mg total) by mouth 3 (three) times daily as needed for cough., Disp: 15 capsule, Rfl: 0 .  HYDROcodone-acetaminophen (NORCO/VICODIN) 5-325 MG tablet, 1 or 2 tabs PO q12 hours prn pain, Disp: 10 tablet, Rfl: 0 .  lisinopril (PRINIVIL,ZESTRIL) 5 MG tablet, Take 5 mg by mouth daily., Disp: , Rfl:  .  simvastatin (ZOCOR) 40 MG tablet, Take 40 mg by mouth every evening. , Disp: , Rfl:   Allergies Codeine  Review of Systems Review of Systems - Oncology ROS negative other than weight loss, headache, SOB   Physical Exam  Vitals Wt Readings from Last 3 Encounters:  11/18/17 140 lb 1.6 oz (63.5 kg)  11/16/17 150 lb (68 kg)  07/16/17 160 lb (72.6 kg)   Temp Readings from Last 3 Encounters:  11/18/17 98.2 F (36.8 C) (Oral)  11/16/17 98 F (36.7 C) (Oral)  07/16/17 98.9 F (37.2 C) (Oral)   BP Readings from Last 3 Encounters:  11/18/17 (!) 169/52  11/16/17 (!) 196/56  07/16/17 (!) 136/50   Pulse Readings from Last 3 Encounters:  11/18/17 67  11/16/17 (!) 58  07/16/17 (!) 58   Constitutional: Well-developed, well-nourished, and in no distress.   HENT: Head: Normocephalic and atraumatic.  Mouth/Throat: No oropharyngeal exudate. Mucosa moist. Eyes: Pupils are equal, round, and reactive to light. Conjunctivae are normal. No scleral icterus.  Neck: Normal range of motion. Neck supple. No JVD present.  Cardiovascular: Normal rate, regular rhythm and normal heart sounds.  Exam reveals no gallop and no friction rub.   No murmur heard. Pulmonary/Chest: Effort normal.  Coarse BS noted bilaterally.   Abdominal: Soft. Bowel sounds are normal. No distension. There is no tenderness. There is no guarding.  Musculoskeletal: No edema or tenderness.  Lymphadenopathy: No cervical, axillary or  supraclavicular adenopathy.  Neurological: Alert and oriented to person, place, and time. No cranial nerve deficit.  Skin: Skin is warm and dry. No rash noted. No erythema. No pallor.  Psychiatric: Affect and judgment normal.   Labs Admission on 11/16/2017, Discharged on 11/16/2017  Component Date Value Ref Range Status  . WBC 11/16/2017 4.6  4.0 - 10.5 K/uL Final  . RBC 11/16/2017 5.11  3.87 - 5.11 MIL/uL Final  . Hemoglobin 11/16/2017 13.5  12.0 - 15.0 g/dL Final  . HCT 11/16/2017 44.1  36.0 - 46.0 % Final  . MCV 11/16/2017 86.3  80.0 - 100.0 fL Final  . MCH 11/16/2017 26.4  26.0 - 34.0 pg Final  . MCHC 11/16/2017 30.6  30.0 - 36.0 g/dL Final  . RDW 11/16/2017 12.9  11.5 - 15.5 % Final  . Platelets 11/16/2017 301  150 - 400 K/uL Final  . nRBC 11/16/2017 0.0  0.0 - 0.2 % Final  . Neutrophils Relative % 11/16/2017 70  %  Final  . Neutro Abs 11/16/2017 3.2  1.7 - 7.7 K/uL Final  . Lymphocytes Relative 11/16/2017 16  % Final  . Lymphs Abs 11/16/2017 0.7  0.7 - 4.0 K/uL Final  . Monocytes Relative 11/16/2017 12  % Final  . Monocytes Absolute 11/16/2017 0.6  0.1 - 1.0 K/uL Final  . Eosinophils Relative 11/16/2017 2  % Final  . Eosinophils Absolute 11/16/2017 0.1  0.0 - 0.5 K/uL Final  . Basophils Relative 11/16/2017 0  % Final  . Basophils Absolute 11/16/2017 0.0  0.0 - 0.1 K/uL Final  . Immature Granulocytes 11/16/2017 0  % Final  . Abs Immature Granulocytes 11/16/2017 0.01  0.00 - 0.07 K/uL Final   Performed at [facility], [address], [place], [place] [address]  . Sodium 11/16/2017 137  135 - 145 mmol/L Final  . Potassium 11/16/2017 3.6  3.5 - 5.1 mmol/L Final  . Chloride 11/16/2017 104  98 - 111 mmol/L Final  . CO2 11/16/2017 26  22 - 32 mmol/L Final  . Glucose, Bld 11/16/2017 101* 70 - 99 mg/dL Final  . BUN 11/16/2017 11  8 - 23 mg/dL Final  . Creatinine, Ser 11/16/2017 0.60  0.44 - 1.00 mg/dL Final  . Calcium 11/16/2017 9.4  8.9 - 10.3 mg/dL Final  . Total Protein  11/16/2017 8.2* 6.5 - 8.1 g/dL Final  . Albumin 11/16/2017 4.2  3.5 - 5.0 g/dL Final  . AST 11/16/2017 19  15 - 41 U/L Final  . ALT 11/16/2017 10  0 - 44 U/L Final  . Alkaline Phosphatase 11/16/2017 51  38 - 126 U/L Final  . Total Bilirubin 11/16/2017 1.0  0.3 - 1.2 mg/dL Final  . GFR calc non Af Amer 11/16/2017 >60  >60 mL/min Final  . GFR calc Af Amer 11/16/2017 >60  >60 mL/min Final   Comment: (NOTE) The eGFR has been calculated using the CKD EPI equation. This calculation has not been validated in all clinical situations. eGFR's persistently <60 mL/min signify possible Chronic Kidney Disease.   Georgiann Hahn gap 11/16/2017 7  5 - 15 Final   Performed at [facility] [address], [place], [place] [address]  . Troponin I 11/16/2017 <0.03  <0.03 ng/mL Final   Performed at [facility], [address] [place], [place] [address]     Pathology Orders Placed This Encounter  Procedures  . NM PET Image Initial (PI) Skull Base To Thigh    Standing Status:   Future    Standing Expiration Date:   11/18/2018    Order Specific Question:   If indicated for the ordered procedure, I authorize the administration of a radiopharmaceutical per Radiology protocol    Answer:   Yes    Order Specific Question:   Preferred imaging location?    Answer:   [facility]    Order Specific Question:   Radiology Contrast Protocol - do NOT remove file path    Answer:   [email or web address]  . MR Brain W Wo Contrast    Standing Status:   Future    Standing Expiration Date:   11/18/2018    Order Specific Question:   ** REASON FOR EXAM (FREE TEXT)    Answer:   headache    Order Specific Question:   If indicated for the ordered procedure, I authorize the administration of contrast media per Radiology protocol    Answer:   Yes    Order Specific Question:   What is the patient's sedation requirement?  Answer:   No Sedation    Order Specific Question:   Does the patient have a  pacemaker or implanted devices?    Answer:   No    Order Specific Question:   Use SRS Protocol?    Answer:   No    Order Specific Question:   Radiology Contrast Protocol - do NOT remove file path    Answer:   [email or web address]    Order Specific Question:   Preferred imaging location?    Answer:   [facility] (table limit-350lbs)       Zoila Shutter MD

## 2017-11-20 DIAGNOSIS — I10 Essential (primary) hypertension: Secondary | ICD-10-CM | POA: Diagnosis not present

## 2017-11-20 DIAGNOSIS — R0603 Acute respiratory distress: Secondary | ICD-10-CM | POA: Diagnosis not present

## 2017-11-20 DIAGNOSIS — D491 Neoplasm of unspecified behavior of respiratory system: Secondary | ICD-10-CM | POA: Diagnosis not present

## 2017-11-20 DIAGNOSIS — C3480 Malignant neoplasm of overlapping sites of unspecified bronchus and lung: Secondary | ICD-10-CM | POA: Diagnosis not present

## 2017-11-20 DIAGNOSIS — E119 Type 2 diabetes mellitus without complications: Secondary | ICD-10-CM | POA: Diagnosis not present

## 2017-11-20 DIAGNOSIS — Z6821 Body mass index (BMI) 21.0-21.9, adult: Secondary | ICD-10-CM | POA: Diagnosis not present

## 2017-11-20 DIAGNOSIS — Z1389 Encounter for screening for other disorder: Secondary | ICD-10-CM | POA: Diagnosis not present

## 2017-12-01 ENCOUNTER — Ambulatory Visit (HOSPITAL_COMMUNITY)
Admission: RE | Admit: 2017-12-01 | Discharge: 2017-12-01 | Disposition: A | Discharge status: Home / Self Care | Payer: Medicare HMO | Source: Ambulatory Visit | Attending: Internal Medicine | Admitting: Internal Medicine

## 2017-12-01 DIAGNOSIS — R911 Solitary pulmonary nodule: Secondary | ICD-10-CM | POA: Diagnosis not present

## 2017-12-01 DIAGNOSIS — R51 Headache: Secondary | ICD-10-CM | POA: Diagnosis not present

## 2017-12-01 DIAGNOSIS — R918 Other nonspecific abnormal finding of lung field: Secondary | ICD-10-CM | POA: Diagnosis not present

## 2017-12-01 MED ORDER — GADOBUTROL 1 MMOL/ML IV SOLN
6.0000 mL | Freq: Once | INTRAVENOUS | Status: AC | PRN
  Administered 2017-12-01: 6 mL via INTRAVENOUS

## 2017-12-01 MED ORDER — FLUDEOXYGLUCOSE F - 18 (FDG) INJECTION
7.7000 | Freq: Once | INTRAVENOUS | Status: AC | PRN
  Administered 2017-12-01: 7.7 via INTRAVENOUS

## 2017-12-04 ENCOUNTER — Ambulatory Visit (HOSPITAL_COMMUNITY): Payer: 59 | Admitting: Internal Medicine

## 2017-12-05 ENCOUNTER — Encounter (HOSPITAL_COMMUNITY): Payer: Self-pay | Admitting: Internal Medicine

## 2017-12-05 ENCOUNTER — Inpatient Hospital Stay (HOSPITAL_COMMUNITY): Payer: Medicare HMO | Attending: Internal Medicine | Admitting: Internal Medicine

## 2017-12-05 VITALS — BP 183/54 | HR 63 | Temp 98.4°F | Resp 18 | Wt 138.3 lb

## 2017-12-05 DIAGNOSIS — I700 Atherosclerosis of aorta: Secondary | ICD-10-CM

## 2017-12-05 DIAGNOSIS — I10 Essential (primary) hypertension: Secondary | ICD-10-CM | POA: Insufficient documentation

## 2017-12-05 DIAGNOSIS — G939 Disorder of brain, unspecified: Secondary | ICD-10-CM | POA: Diagnosis not present

## 2017-12-05 DIAGNOSIS — Z79899 Other long term (current) drug therapy: Secondary | ICD-10-CM | POA: Diagnosis not present

## 2017-12-05 DIAGNOSIS — F17210 Nicotine dependence, cigarettes, uncomplicated: Secondary | ICD-10-CM | POA: Insufficient documentation

## 2017-12-05 DIAGNOSIS — Z7982 Long term (current) use of aspirin: Secondary | ICD-10-CM | POA: Insufficient documentation

## 2017-12-05 DIAGNOSIS — J439 Emphysema, unspecified: Secondary | ICD-10-CM | POA: Diagnosis not present

## 2017-12-05 DIAGNOSIS — R918 Other nonspecific abnormal finding of lung field: Secondary | ICD-10-CM | POA: Diagnosis not present

## 2017-12-05 DIAGNOSIS — R0789 Other chest pain: Secondary | ICD-10-CM | POA: Insufficient documentation

## 2017-12-05 MED ORDER — HYDROCODONE-ACETAMINOPHEN 5-325 MG PO TABS: ORAL_TABLET | ORAL | 0 refills | Status: DC

## 2017-12-05 NOTE — Progress Notes (Signed)
Diagnosis No diagnosis found.  Staging Cancer Staging No matching staging information was found for the patient.  Assessment and Plan:   1.  Lung mass.  Pt had CTA of the chest on 11/16/2017 with findings of  IMPRESSION: 1. 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 There are multiple adjacent nodules within the left upper lobe as well as scattered nodules throughout the lungs bilaterally concerning for metastatic disease. Left hilar adenopathy concerning for metastatic disease. 2. Indeterminate left adrenal nodule. 3. No evidence for acute pulmonary embolus. 4. Aortic Atherosclerosis (ICD10-I70.0) and Emphysema (ICD10-J43.9).  Pt has not undergone biopsy yet.  She reports weight loss of 10 pounds over past month.    Pet scan done 12/01/2017 reviewed and showed   IMPRESSION: 1. Hypermetabolic LEFT hilar mass which encroaches upon the AP window. 2. Nodularity in the anterior LEFT upper lobe and superior LEFT upper lobe consistent with primary or metastatic lung cancer. 3. Hypermetabolic metastatic pulmonary nodule within the RIGHT upper lobe. 4. 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 5. 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 6. No evidence of metastatic lung carcinoma outside the thorax.  MRI of brain done 12/01/2017 reviewed and showed  IMPRESSION: 1. 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 2. No other enhancing lesions to suggest metastatic disease. 3. No acute intracranial abnormality. 4. Mild chronic small vessel ischemic disease. 5. Chronic cerebellar infarcts.  Pt has been referred to CT surgery  for biopsy.  This was facilitated ASAP due to CTA and PET findings.    Pt will also be referred to RT  for opinion based on the brain lesion noted on MRI and due to PET findings which indicate pt is likely stage 3 or 4.  She will RTC once RT and CT surgery evaluation have been performed and biopsy performed.  Pt is given a RX for Vicodin today due to chest wall discomfort.   All questions answered and pt expressed understanding of the information presented.   2.  Brain lesion.  MRI of brain done due to advanced findings suspicious for malignancy on CTA done 12/01/2017 and was reviewed and showed impression:   1. 6 mm posterior right frontal extra-axial mass most likelyreflecting an incidental meningioma.  I discussed with her menigiomas are usually considered benign tumors.  Will ask for RT option regarding MRI of Brain due to advanced findings suspicious for lung cancer on PET scan to determine if MRI  findings suggestive of metastatic disease.    3.  HTN.  BP is 183/54.  Follow-up with PCP.    4.  Chest wall discomfort.  Pt describes this as mild.  Pulse ox is 97% on room air.  Pt is not in respiratory distress.  CTA negative for PE.  Likely due to lung findings on CTA.  Rx for Vicodin given today.    25 minutes spent with review of records, counseling and coordination of care.    Interval History:  Historical data obtained from note dated 11/18/2017:  77 yr old female presented to ED due to shortness of breath.  She has a long smoking history.  She underwent CTA of the chest on 11/16/2017 with findings of  IMPRESSION: 1. 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 There are multiple adjacent nodules within the  left upper lobe as well as scattered nodules throughout the lungs bilaterally concerning for metastatic disease. Left hilar adenopathy concerning for metastatic disease. 2. Indeterminate left adrenal nodule. 3. No evidence for acute pulmonary embolus. 4. Aortic Atherosclerosis (ICD10-I70.0) and Emphysema (ICD10-J43.9).  Pt has not undergone biopsy yet.  She  reports Cough, weight loss of 10 pounds over past month.  Pt is seen today for Consultation due to abnormal scan.   Current Status:  Pt is seen today for follow-up.  She is here to go over recent PET scan and MRI of brain.  Pt reports some chest wall pain that is not severe.    Problem List Patient Active Problem List   Diagnosis Date Noted  . Special screening for malignant neoplasms, colon [Z12.11]     Past Medical History Past Medical History:  Diagnosis Date  . Borderline diabetes   . Hyperlipidemia   . Hypertension     Past Surgical History Past Surgical History:  Procedure Laterality Date  . ABDOMINAL HYSTERECTOMY    . COLONOSCOPY N/A 07/16/2017   Procedure: COLONOSCOPY;  Surgeon: Danie Binder, MD;  [facility];  Service: Endoscopy;  Laterality: N/A;  10:30  . POLYPECTOMY  07/16/2017   Procedure: POLYPECTOMY;  Surgeon: Danie Binder, MD;  Location: [facility];  Service: Endoscopy;;  cecal x2;hepatic flexurex5; splenic x1;descending x2    Family History Family History  Problem Relation Age of Onset  . Hypertension Mother   . Hypertension Father   . Colon cancer Neg Hx   . Colon polyps Neg Hx      Social History  reports that she has been smoking cigarettes. She has been smoking about 0.50 packs per day. She has never used smokeless tobacco. She reports that she does not drink alcohol or use drugs.  Medications  Current Outpatient Medications:  .  albuterol (PROVENTIL HFA;VENTOLIN HFA) 108 (90 Base) MCG/ACT inhaler, Inhale 2 puffs into the lungs every 4 (four) hours as needed for wheezing or shortness of breath (or coughing)., Disp: 1 Inhaler, Rfl: 0 .  amLODipine (NORVASC) 5 MG tablet, Take 5 mg by mouth daily., Disp: , Rfl:  .  aspirin EC 81 MG tablet, Take 81 mg by mouth daily., Disp: , Rfl:  .  benzonatate (TESSALON) 100 MG capsule, Take 1 capsule (100 mg total) by mouth 3 (three) times daily as needed for cough., Disp: 15 capsule, Rfl: 0 .   HYDROcodone-acetaminophen (NORCO/VICODIN) 5-325 MG tablet, 1 or 2 tabs PO q12 hours prn pain, Disp: 60 tablet, Rfl: 0 .  lisinopril (PRINIVIL,ZESTRIL) 5 MG tablet, Take 5 mg by mouth daily., Disp: , Rfl:  .  simvastatin (ZOCOR) 40 MG tablet, Take 40 mg by mouth every evening. , Disp: , Rfl:   Allergies Codeine  Review of Systems Review of Systems - Oncology ROS negative other than mild chest wall discomfort.     Physical Exam  Vitals Wt Readings from Last 3 Encounters:  12/05/17 138 lb 4.8 oz (62.7 kg)  11/18/17 140 lb 1.6 oz (63.5 kg)  11/16/17 150 lb (68 kg)   Temp Readings from Last 3 Encounters:  12/05/17 98.4 F (36.9 C) (Oral)  11/18/17 98.2 F (36.8 C) (Oral)  11/16/17 98 F (36.7 C) (Oral)   BP Readings from Last 3 Encounters:  12/05/17 (!) 183/54  11/18/17 (!) 169/52  11/16/17 (!) 196/56   Pulse Readings from Last 3 Encounters:  12/05/17 63  11/18/17 67  11/16/17 (!) 58  Constitutional: Well-developed, well-nourished, and in no distress.   HENT: Head: Normocephalic and atraumatic.  Mouth/Throat: No oropharyngeal exudate. Mucosa moist. Eyes: Pupils are equal, round, and reactive to light. Conjunctivae are normal. No scleral icterus.  Neck: Normal range of motion. Neck supple. No JVD present.  Cardiovascular: Normal rate, regular rhythm and normal heart sounds.  Exam reveals no gallop and no friction rub.   No murmur heard. Pulmonary/Chest: Effort normal.  Coarse breath sounds.   Abdominal: Soft. Bowel sounds are normal. No distension. There is no tenderness. There is no guarding.  Musculoskeletal: No edema or tenderness.  Lymphadenopathy: No cervical, axillary  or supraclavicular adenopathy.  Neurological: Alert and oriented to person, place, and time. No cranial nerve deficit.  Skin: Skin is warm and dry. No rash noted. No erythema. No pallor.  Psychiatric: Affect and judgment normal.   Labs No visits with results within 3 Day(s) from this visit.   Latest known visit with results is:  Admission on 11/16/2017, Discharged on 11/16/2017  Component Date Value Ref Range Status  . WBC 11/16/2017 4.6  4.0 - 10.5 K/uL Final  . RBC 11/16/2017 5.11  3.87 - 5.11 MIL/uL Final  . Hemoglobin 11/16/2017 13.5  12.0 - 15.0 g/dL Final  . HCT 11/16/2017 44.1  36.0 - 46.0 % Final  . MCV 11/16/2017 86.3  80.0 - 100.0 fL Final  . MCH 11/16/2017 26.4  26.0 - 34.0 pg Final  . MCHC 11/16/2017 30.6  30.0 - 36.0 g/dL Final  . RDW 11/16/2017 12.9  11.5 - 15.5 % Final  . Platelets 11/16/2017 301  150 - 400 K/uL Final  . nRBC 11/16/2017 0.0  0.0 - 0.2 % Final  . Neutrophils Relative % 11/16/2017 70  % Final  . Neutro Abs 11/16/2017 3.2  1.7 - 7.7 K/uL Final  . Lymphocytes Relative 11/16/2017 16  % Final  . Lymphs Abs 11/16/2017 0.7  0.7 - 4.0 K/uL Final  . Monocytes Relative 11/16/2017 12  % Final  . Monocytes Absolute 11/16/2017 0.6  0.1 - 1.0 K/uL Final  . Eosinophils Relative 11/16/2017 2  % Final  . Eosinophils Absolute 11/16/2017 0.1  0.0 - 0.5 K/uL Final  . Basophils Relative 11/16/2017 0  % Final  . Basophils Absolute 11/16/2017 0.0  0.0 - 0.1 K/uL Final  . Immature Granulocytes 11/16/2017 0  % Final  . Abs Immature Granulocytes 11/16/2017 0.01  0.00 - 0.07 K/uL Final   Performed at [facility], [address], [place], [place] [address]  . Sodium 11/16/2017 137  135 - 145 mmol/L Final  . Potassium 11/16/2017 3.6  3.5 - 5.1 mmol/L Final  . Chloride 11/16/2017 104  98 - 111 mmol/L Final  . CO2 11/16/2017 26  22 - 32 mmol/L Final  . Glucose, Bld 11/16/2017 101* 70 - 99 mg/dL Final  . BUN 11/16/2017 11  8 - 23 mg/dL Final  . Creatinine, Ser 11/16/2017 0.60  0.44 - 1.00 mg/dL Final  . Calcium 11/16/2017 9.4  8.9 - 10.3 mg/dL Final  . Total Protein 11/16/2017 8.2* 6.5 - 8.1 g/dL Final  . Albumin 11/16/2017 4.2  3.5 - 5.0 g/dL Final  . AST 11/16/2017 19  15 - 41 U/L Final  . ALT 11/16/2017 10  0 - 44 U/L Final  . Alkaline Phosphatase 11/16/2017 51   38 - 126 U/L Final  . Total Bilirubin 11/16/2017 1.0  0.3 - 1.2 mg/dL Final  . GFR calc non Af Amer 11/16/2017 >60  >60  mL/min Final  . GFR calc Af Amer 11/16/2017 >60  >60 mL/min Final   Comment: (NOTE) The eGFR has been calculated using the CKD EPI equation. This calculation has not been validated in all clinical situations. eGFR's persistently <60 mL/min signify possible Chronic Kidney Disease.   Georgiann Hahn gap 11/16/2017 7  5 - 15 Final   Performed at [facility], [address], [place], [place] [address]  . Troponin I 11/16/2017 <0.03  <0.03 ng/mL Final   Performed at [facility], [address], [place], [place] [address]     Pathology No orders of the defined types were placed in this encounter.      Zoila Shutter MD

## 2017-12-09 ENCOUNTER — Institutional Professional Consult (permissible substitution): Payer: Medicare HMO | Admitting: Thoracic Surgery (Cardiothoracic Vascular Surgery)

## 2017-12-09 ENCOUNTER — Encounter: Payer: Self-pay | Admitting: Thoracic Surgery (Cardiothoracic Vascular Surgery)

## 2017-12-09 VITALS — BP 170/64 | HR 66 | Resp 16 | Ht 70.0 in | Wt 141.0 lb

## 2017-12-09 DIAGNOSIS — R918 Other nonspecific abnormal finding of lung field: Secondary | ICD-10-CM | POA: Diagnosis not present

## 2017-12-09 DIAGNOSIS — R590 Localized enlarged lymph nodes: Secondary | ICD-10-CM

## 2017-12-09 NOTE — H&P (View-Only) (Signed)
PCP is Redmond School, MD Referring Provider is Higgs, Mathis Dad, MD  Chief Complaint  Patient presents with  . Lung Mass    Surgical eval for SUPRAHILAR MASS, PET Scan  and MR Brain 12/01/17, Chest CTA 11/16/17  . Adenopathy    HILAR  . Lung Lesion    MULTIPLE    HPI:   Past Medical History:  Diagnosis Date  . Borderline diabetes   . Hyperlipidemia   . Hypertension     Past Surgical History:  Procedure Laterality Date  . ABDOMINAL HYSTERECTOMY    . COLONOSCOPY N/A 07/16/2017   Procedure: COLONOSCOPY;  Surgeon: Danie Binder, MD;  [facility]: [facility];  Service: Endoscopy;  Laterality: N/A;  10:30  . POLYPECTOMY  07/16/2017   Procedure: POLYPECTOMY;  Surgeon: Danie Binder, MD;  Location: [facility];  Service: Endoscopy;;  cecal x2;hepatic flexurex5; splenic x1;descending x2    Family History  Problem Relation Age of Onset  . Hypertension Mother   . Hypertension Father   . Colon cancer Neg Hx   . Colon polyps Neg Hx     Social History Social History   Tobacco Use  . Smoking status: Former Smoker    Packs/day: 1.00    Years: 40.00    Pack years: 40.00    Types: Cigarettes    Last attempt to quit: 12/09/2012    Years since quitting: 5.0  . Smokeless tobacco: Never Used  . Tobacco comment: off and on since her teens  Substance Use Topics  . Alcohol use: No  . Drug use: No    Current Outpatient Medications  Medication Sig Dispense Refill  . albuterol (PROVENTIL HFA;VENTOLIN HFA) 108 (90 Base) MCG/ACT inhaler Inhale 2 puffs into the lungs every 4 (four) hours as needed for wheezing or shortness of breath (or coughing). 1 Inhaler 0  . amLODipine (NORVASC) 5 MG tablet Take 5 mg by mouth daily.    Marland Kitchen aspirin EC 81 MG tablet Take 81 mg by mouth daily.    . benzonatate (TESSALON) 100 MG capsule Take 1 capsule (100 mg total) by mouth 3 (three) times daily as needed for cough. 15 capsule 0  . HYDROcodone-acetaminophen (NORCO/VICODIN) 5-325 MG tablet 1 or 2 tabs  PO q12 hours prn pain 60 tablet 0  . lisinopril (PRINIVIL,ZESTRIL) 5 MG tablet Take 5 mg by mouth daily.    . simvastatin (ZOCOR) 40 MG tablet Take 40 mg by mouth every evening.      No current facility-administered medications for this visit.     Allergies  Allergen Reactions  . Codeine     Review of Systems  Constitutional: Positive for activity change, appetite change and unexpected weight change (Lost 19 pounds in 3 months).  HENT: Negative for trouble swallowing.   Respiratory: Positive for shortness of breath and wheezing.   Cardiovascular: Negative for chest pain and leg swelling.  Genitourinary: Negative for difficulty urinating and dysuria.  Musculoskeletal: Negative for arthralgias and joint swelling.  Skin: Negative for rash.       Itching  Neurological: Negative for seizures and weakness.  Hematological: Negative for adenopathy.    BP (!) 170/64 (BP Location: Right Arm, Patient Position: Sitting, Cuff Size: Large) Comment: hasn't taken her meds today  Pulse 66   Resp 16   Ht 5\' 10"  (1.778 m)   Wt 141 lb (64 kg)   SpO2 97% Comment: RA  BMI 20.23 kg/m  Physical Exam  Constitutional: She is oriented to person, place,  and time. She appears well-developed and well-nourished. No distress.  HENT:  Head: Normocephalic and atraumatic.  Mouth/Throat: No oropharyngeal exudate.  Eyes: EOM are normal. No scleral icterus.  Neck: Neck supple. No thyromegaly present.  Cardiovascular: Normal rate, regular rhythm, normal heart sounds and intact distal pulses. Exam reveals no gallop and no friction rub.  No murmur heard. Pulmonary/Chest: Effort normal and breath sounds normal. No respiratory distress. She has no wheezes.  Abdominal: Soft. She exhibits no distension. There is no tenderness.  Musculoskeletal: She exhibits no edema.  Lymphadenopathy:    She has no cervical adenopathy.  Neurological: She is alert and oriented to person, place, and time. No cranial nerve deficit.  She exhibits normal muscle tone. Coordination normal.  Skin: Skin is warm and dry.  Vitals reviewed.    Diagnostic Tests: CT ANGIOGRAPHY CHEST WITH CONTRAST  TECHNIQUE: Multidetector CT imaging of the chest was performed using the standard protocol during bolus administration of intravenous contrast. Multiplanar CT image reconstructions and MIPs were obtained to evaluate the vascular anatomy.  CONTRAST:  150mL ISOVUE-370 IOPAMIDOL (ISOVUE-370) INJECTION 76%  COMPARISON:  Chest radiograph 11/16/2017  FINDINGS: Cardiovascular: Normal heart size. Trace fluid superior pericardial recess. Thoracic aortic vascular calcifications. Contrast opacifies the pulmonary arterial system. No evidence for acute pulmonary embolus. Left upper lobe mass narrows the left upper lobe pulmonary arteries.  Mediastinum/Nodes: There is a 4.5 x 4.5 cm left suprahilar soft tissue mass which infiltrates into a portion of the superior mediastinum. There is a 1.5 cm left hilar node (image 52; series 6). No axillary adenopathy. Normal appearance of the esophagus.  Lungs/Pleura: Central airways are patent. Centrilobular and paraseptal emphysematous change. Large left suprahilar/left upper lobe mass. Multiple small nodules are demonstrated surrounding the left upper lobe mass. Additionally there is a 1.1 cm left upper lobe nodule (image 36; series 8). A 0.9 cm left upper lobe nodule (image 39; series 8). A 2.2 cm left upper lobe nodule (image 63; series 8). A 1.3 cm left lower lobe nodule (image 109; series 8). A 0.5 cm right lower lobe nodule (image 59; series 8). A 1.2 cm right upper lobe nodule (image 65; series 8). Multiple additional small nodules are demonstrated throughout the lungs bilaterally. No pleural effusion or pneumothorax.  Upper Abdomen: Tiny hyperenhancing regions within the left and right hepatic lobes (image 103; series 6) (image 89; series 6), likely small perfusion anomalies.  Indeterminate 2.1 cm left adrenal nodule (image 99; series 6).  Musculoskeletal: No aggressive or acute appearing osseous lesions.  Review of the MIP images confirms the above findings.  IMPRESSION: 1. 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 There are multiple adjacent nodules within the left upper lobe as well as scattered nodules throughout the lungs bilaterally concerning for metastatic disease. Left hilar adenopathy concerning for metastatic disease. 2. Indeterminate left adrenal nodule. 3. No evidence for acute pulmonary embolus. 4. Aortic Atherosclerosis (ICD10-I70.0) and Emphysema (ICD10-J43.9).   Electronically Signed   By: Lovey Newcomer M.D.   On: 11/16/2017 15:48 NUCLEAR MEDICINE PET SKULL BASE TO THIGH  TECHNIQUE: 7.7 mCi F-18 FDG was injected intravenously. Full-ring PET imaging was performed from the skull base to thigh after the radiotracer. CT data was obtained and used for attenuation correction and anatomic localization.  Fasting blood glucose: 93 mg/dl  COMPARISON:  Chest CT 11/16/2017  FINDINGS: Mediastinal blood pool activity: SUV max 2.1  NECK: Asymmetric activity in the larynx with the RIGHT vocal cord hypermetabolic compared to the LEFT. This  presumably reflects encroachment upon the LEFT recurrent laryngeal nerve by the LEFT hilar tumor.  No hypermetabolic lymph nodes in the neck  Incidental CT findings: none  CHEST: LEFT hilar mass measuring 4.3 x 4.3 cm with SUV max equal 10.6. Mass extends into the AP window likely involves the LEFT recurrent laryngeal nerve as above.  There are several satellite nodules superior to the LEFT hilar mass. Example 9 mm LEFT upper lobe nodule with SUV max equal 4.9 on image 79 of fused data set.  Additionally there is a cluster of nodules in the anterior LEFT upper lobe measuring 3.5 cm in conglomerate with SUV max equal 10.0.  Within the RIGHT lung there is a  hypermetabolic nodule peripherally in the upper lobe measuring 6 mm with SUV max equal 4.1.  No hypermetabolic axillary lymph nodes no hypermetabolic supraclavicular nodes.  Incidental finding of a hypermetabolic nodule within the RIGHT lobe of thyroid gland with SUV max equal 4.9. This nodule measures 10 mm on the CT portion  Incidental CT findings: none  ABDOMEN/PELVIS: No abnormal hypermetabolic activity within the liver, pancreas, adrenal glands, or spleen. No hypermetabolic lymph nodes in the abdomen or pelvis.  Incidental CT findings: none  SKELETON: No focal hypermetabolic activity to suggest skeletal metastasis.  Incidental CT findings: none  IMPRESSION: 1. Hypermetabolic LEFT hilar mass which encroaches upon the AP window. 2. Nodularity in the anterior LEFT upper lobe and superior LEFT upper lobe consistent with primary or metastatic lung cancer. 3. Hypermetabolic metastatic pulmonary nodule within the RIGHT upper lobe. 4. 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 5. 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 6. No evidence of metastatic lung carcinoma outside the thorax.   Electronically Signed   By: Suzy Bouchard M.D.   On: 12/02/2017 10:00 Personally reviewed the CT and PET/CT images and concur with the findings noted above.   Impression: Ms. Curtice is a 77 year old woman with a history of tobacco abuse, hypertension, hyperlipidemia, and borderline diabetes who presents with shortness of breath, loss of appetite, weight loss, and general malaise.  She smoked about a pack of cigarettes daily for over 40 years prior to quitting about 5 years ago.  She has been found to have a left hilar mass with extension into the mediastinum, involvement of the aortopulmonary window lymph  nodes and a contralateral lung metastasis.  Findings are consistent with a T4, N2, M1, stage IV lung cancer.  She needs a biopsy to confirm diagnosis so that appropriate treatment can be initiated.  I recommended her that we proceed with bronchoscopy and endobronchial ultrasound for diagnostic purposes.  She understands this will be done in the operating room under general anesthesia.  We will plan to do it on an outpatient basis.  I informed her of the indications, risks, benefits, and alternatives.  She understands the risk include, but not limited to death, MI, blood clots, stroke, bleeding, pneumothorax, failure to make a diagnosis, as well as possibility of other unforeseeable complications.  She accepts the risk and agrees to proceed  Hypertension-blood pressure elevated.  Did not take medications this morning.  Hyperlipidemia-on Zocor  Plan: Bronchoscopy and endobronchial ultrasound on Friday, 12/12/2017  Melrose Nakayama, MD [facility] [phone]

## 2017-12-09 NOTE — Progress Notes (Signed)
PCP is Redmond School, MD Referring Provider is Higgs, Mathis Dad, MD  Chief Complaint  Patient presents with  . Lung Mass    Surgical eval for SUPRAHILAR MASS, PET Scan  and MR Brain 12/01/17, Chest CTA 11/16/17  . Adenopathy    HILAR  . Lung Lesion    MULTIPLE    HPI:   Past Medical History:  Diagnosis Date  . Borderline diabetes   . Hyperlipidemia   . Hypertension     Past Surgical History:  Procedure Laterality Date  . ABDOMINAL HYSTERECTOMY    . COLONOSCOPY N/A 07/16/2017   Procedure: COLONOSCOPY;  Surgeon: Danie Binder, MD;  Location: [facility];  Service: Endoscopy;  Laterality: N/A;  10:30  . POLYPECTOMY  07/16/2017   Procedure: POLYPECTOMY;  Surgeon: Danie Binder, MD;  Location: [facility];  Service: Endoscopy;;  cecal x2;hepatic flexurex5; splenic x1;descending x2    Family History  Problem Relation Age of Onset  . Hypertension Mother   . Hypertension Father   . Colon cancer Neg Hx   . Colon polyps Neg Hx     Social History Social History   Tobacco Use  . Smoking status: Former Smoker    Packs/day: 1.00    Years: 40.00    Pack years: 40.00    Types: Cigarettes    Last attempt to quit: 12/09/2012    Years since quitting: 5.0  . Smokeless tobacco: Never Used  . Tobacco comment: off and on since her teens  Substance Use Topics  . Alcohol use: No  . Drug use: No    Current Outpatient Medications  Medication Sig Dispense Refill  . albuterol (PROVENTIL HFA;VENTOLIN HFA) 108 (90 Base) MCG/ACT inhaler Inhale 2 puffs into the lungs every 4 (four) hours as needed for wheezing or shortness of breath (or coughing). 1 Inhaler 0  . amLODipine (NORVASC) 5 MG tablet Take 5 mg by mouth daily.    Marland Kitchen aspirin EC 81 MG tablet Take 81 mg by mouth daily.    . benzonatate (TESSALON) 100 MG capsule Take 1 capsule (100 mg total) by mouth 3 (three) times daily as needed for cough. 15 capsule 0  . HYDROcodone-acetaminophen (NORCO/VICODIN) 5-325 MG tablet 1 or 2 tabs  PO q12 hours prn pain 60 tablet 0  . lisinopril (PRINIVIL,ZESTRIL) 5 MG tablet Take 5 mg by mouth daily.    . simvastatin (ZOCOR) 40 MG tablet Take 40 mg by mouth every evening.      No current facility-administered medications for this visit.     Allergies  Allergen Reactions  . Codeine     Review of Systems  Constitutional: Positive for activity change, appetite change and unexpected weight change (Lost 19 pounds in 3 months).  HENT: Negative for trouble swallowing.   Respiratory: Positive for shortness of breath and wheezing.   Cardiovascular: Negative for chest pain and leg swelling.  Genitourinary: Negative for difficulty urinating and dysuria.  Musculoskeletal: Negative for arthralgias and joint swelling.  Skin: Negative for rash.       Itching  Neurological: Negative for seizures and weakness.  Hematological: Negative for adenopathy.    BP (!) 170/64 (BP Location: Right Arm, Patient Position: Sitting, Cuff Size: Large) Comment: hasn't taken her meds today  Pulse 66   Resp 16   Ht 5\' 10"  (1.778 m)   Wt 141 lb (64 kg)   SpO2 97% Comment: RA  BMI 20.23 kg/m  Physical Exam  Constitutional: She is oriented to person, place,  and time. She appears well-developed and well-nourished. No distress.  HENT:  Head: Normocephalic and atraumatic.  Mouth/Throat: No oropharyngeal exudate.  Eyes: EOM are normal. No scleral icterus.  Neck: Neck supple. No thyromegaly present.  Cardiovascular: Normal rate, regular rhythm, normal heart sounds and intact distal pulses. Exam reveals no gallop and no friction rub.  No murmur heard. Pulmonary/Chest: Effort normal and breath sounds normal. No respiratory distress. She has no wheezes.  Abdominal: Soft. She exhibits no distension. There is no tenderness.  Musculoskeletal: She exhibits no edema.  Lymphadenopathy:    She has no cervical adenopathy.  Neurological: She is alert and oriented to person, place, and time. No cranial nerve deficit.  She exhibits normal muscle tone. Coordination normal.  Skin: Skin is warm and dry.  Vitals reviewed.    Diagnostic Tests: CT ANGIOGRAPHY CHEST WITH CONTRAST  TECHNIQUE: Multidetector CT imaging of the chest was performed using the standard protocol during bolus administration of intravenous contrast. Multiplanar CT image reconstructions and MIPs were obtained to evaluate the vascular anatomy.  CONTRAST:  153mL ISOVUE-370 IOPAMIDOL (ISOVUE-370) INJECTION 76%  COMPARISON:  Chest radiograph 11/16/2017  FINDINGS: Cardiovascular: Normal heart size. Trace fluid superior pericardial recess. Thoracic aortic vascular calcifications. Contrast opacifies the pulmonary arterial system. No evidence for acute pulmonary embolus. Left upper lobe mass narrows the left upper lobe pulmonary arteries.  Mediastinum/Nodes: There is a 4.5 x 4.5 cm left suprahilar soft tissue mass which infiltrates into a portion of the superior mediastinum. There is a 1.5 cm left hilar node (image 52; series 6). No axillary adenopathy. Normal appearance of the esophagus.  Lungs/Pleura: Central airways are patent. Centrilobular and paraseptal emphysematous change. Large left suprahilar/left upper lobe mass. Multiple small nodules are demonstrated surrounding the left upper lobe mass. Additionally there is a 1.1 cm left upper lobe nodule (image 36; series 8). A 0.9 cm left upper lobe nodule (image 39; series 8). A 2.2 cm left upper lobe nodule (image 63; series 8). A 1.3 cm left lower lobe nodule (image 109; series 8). A 0.5 cm right lower lobe nodule (image 59; series 8). A 1.2 cm right upper lobe nodule (image 65; series 8). Multiple additional small nodules are demonstrated throughout the lungs bilaterally. No pleural effusion or pneumothorax.  Upper Abdomen: Tiny hyperenhancing regions within the left and right hepatic lobes (image 103; series 6) (image 89; series 6), likely small perfusion anomalies.  Indeterminate 2.1 cm left adrenal nodule (image 99; series 6).  Musculoskeletal: No aggressive or acute appearing osseous lesions.  Review of the MIP images confirms the above findings.  IMPRESSION: 1. 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 There are multiple adjacent nodules within the left upper lobe as well as scattered nodules throughout the lungs bilaterally concerning for metastatic disease. Left hilar adenopathy concerning for metastatic disease. 2. Indeterminate left adrenal nodule. 3. No evidence for acute pulmonary embolus. 4. Aortic Atherosclerosis (ICD10-I70.0) and Emphysema (ICD10-J43.9).   Electronically Signed   By: Lovey Newcomer M.D.   On: 11/16/2017 15:48 NUCLEAR MEDICINE PET SKULL BASE TO THIGH  TECHNIQUE: 7.7 mCi F-18 FDG was injected intravenously. Full-ring PET imaging was performed from the skull base to thigh after the radiotracer. CT data was obtained and used for attenuation correction and anatomic localization.  Fasting blood glucose: 93 mg/dl  COMPARISON:  Chest CT 11/16/2017  FINDINGS: Mediastinal blood pool activity: SUV max 2.1  NECK: Asymmetric activity in the larynx with the RIGHT vocal cord hypermetabolic compared to the LEFT. This  presumably reflects encroachment upon the LEFT recurrent laryngeal nerve by the LEFT hilar tumor.  No hypermetabolic lymph nodes in the neck  Incidental CT findings: none  CHEST: LEFT hilar mass measuring 4.3 x 4.3 cm with SUV max equal 10.6. Mass extends into the AP window likely involves the LEFT recurrent laryngeal nerve as above.  There are several satellite nodules superior to the LEFT hilar mass. Example 9 mm LEFT upper lobe nodule with SUV max equal 4.9 on image 79 of fused data set.  Additionally there is a cluster of nodules in the anterior LEFT upper lobe measuring 3.5 cm in conglomerate with SUV max equal 10.0.  Within the RIGHT lung there is a  hypermetabolic nodule peripherally in the upper lobe measuring 6 mm with SUV max equal 4.1.  No hypermetabolic axillary lymph nodes no hypermetabolic supraclavicular nodes.  Incidental finding of a hypermetabolic nodule within the RIGHT lobe of thyroid gland with SUV max equal 4.9. This nodule measures 10 mm on the CT portion  Incidental CT findings: none  ABDOMEN/PELVIS: No abnormal hypermetabolic activity within the liver, pancreas, adrenal glands, or spleen. No hypermetabolic lymph nodes in the abdomen or pelvis.  Incidental CT findings: none  SKELETON: No focal hypermetabolic activity to suggest skeletal metastasis.  Incidental CT findings: none  IMPRESSION: 1. Hypermetabolic LEFT hilar mass which encroaches upon the AP window. 2. Nodularity in the anterior LEFT upper lobe and superior LEFT upper lobe consistent with primary or metastatic lung cancer. 3. Hypermetabolic metastatic pulmonary nodule within the RIGHT upper lobe. 4. 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 5. 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 6. No evidence of metastatic lung carcinoma outside the thorax.   Electronically Signed   By: Suzy Bouchard M.D.   On: 12/02/2017 10:00 Personally reviewed the CT and PET/CT images and concur with the findings noted above.   Impression: Ms. Noori is a 77 year old woman with a history of tobacco abuse, hypertension, hyperlipidemia, and borderline diabetes who presents with shortness of breath, loss of appetite, weight loss, and general malaise.  She smoked about a pack of cigarettes daily for over 40 years prior to quitting about 5 years ago.  She has been found to have a left hilar mass with extension into the mediastinum, involvement of the aortopulmonary window lymph  nodes and a contralateral lung metastasis.  Findings are consistent with a T4, N2, M1, stage IV lung cancer.  She needs a biopsy to confirm diagnosis so that appropriate treatment can be initiated.  I recommended her that we proceed with bronchoscopy and endobronchial ultrasound for diagnostic purposes.  She understands this will be done in the operating room under general anesthesia.  We will plan to do it on an outpatient basis.  I informed her of the indications, risks, benefits, and alternatives.  She understands the risk include, but not limited to death, MI, blood clots, stroke, bleeding, pneumothorax, failure to make a diagnosis, as well as possibility of other unforeseeable complications.  She accepts the risk and agrees to proceed  Hypertension-blood pressure elevated.  Did not take medications this morning.  Hyperlipidemia-on Zocor  Plan: Bronchoscopy and endobronchial ultrasound on Friday, 12/12/2017  Melrose Nakayama, MD [facility] [phone]

## 2017-12-10 ENCOUNTER — Other Ambulatory Visit: Payer: Self-pay | Admitting: *Deleted

## 2017-12-10 DIAGNOSIS — R918 Other nonspecific abnormal finding of lung field: Secondary | ICD-10-CM

## 2017-12-11 ENCOUNTER — Ambulatory Visit: Payer: Medicare HMO

## 2017-12-11 ENCOUNTER — Ambulatory Visit
Admission: RE | Admit: 2017-12-11 | Discharge: 2017-12-11 | Disposition: A | Discharge status: Home / Self Care | Payer: Medicare HMO | Source: Ambulatory Visit | Attending: Radiation Oncology | Admitting: Radiation Oncology

## 2017-12-15 ENCOUNTER — Encounter (HOSPITAL_COMMUNITY): Payer: Self-pay | Admitting: *Deleted

## 2017-12-15 ENCOUNTER — Other Ambulatory Visit: Payer: Self-pay

## 2017-12-15 NOTE — Progress Notes (Signed)
Location/Histology of Brain Tumor: Lung cancer primary.  6 mm posterior right frontal extra-axial mass.  Patient presented with symptoms of:  Shortness of breath, loss of appetite, weight loss, and general malaise. Having episodes of prolonged coughing.  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  No other enhancing lesions to suggest metastatic disease.  Chronic cerebellar infarcts.  PET 42/59/5638: Hypermetabolic left hilar mass which encroaches upon the AP window.  Nodularity in the anterior left upper lobe and superior left upper lobe consistent with primary or metastatic lung cancer.  CTA Chest 11/16/2017: 4.5 x 4.5 cm, large left suprahilar mass concerning for primary pulmonary malignancy.  There are multiple adjacent nodules within the left upper lobe as well as scattered nodules throughout the lungs bilaterally concerning for metastatic disease.  Left hilar adenopathy concerning for metastatic disease.  Past or anticipated interventions, if any, per neurosurgery:   Past or anticipated interventions, if any, per cardiothoracic surgery: Dr. Roxan Hockey 12/09/2017 -She has been found to have a left hilar mass with extension into the mediastinum, involvement of the aorto-pulmonary window lymph nodes and a contralateral lung metastasis. -Findings consistent with T4, N2, M1, stage IV lung cancer -She needs a biopsy to confirm diagnosis so that appropriate treatment can be initiated. -I recommended her that we proceed with bronchoscopy and endobronchial ultrasound for diagnostic purposes. -Bronchoscopy and endobronchial ultrasound 12/17/2017.  Past or anticipated interventions, if any, per medical oncology:  Dr. Walden Field 12/05/2017 -Pt has been referred to CT surgery for biopsy.  This was facilitated ASAP due to CTA and PET findings. -Pt will also be referred to RT for opinion based on the brain lesion noted on MRI and due  to PET findings which indicate pt is likely stage 3 or 4.   -She will return to clinic once RT and CT surgery evaluation have been performed and biopsy performed. -I discussed with her meningiomas are usually considered benign tumors.  Will ask for RT option regarding MRI of brain due to advanced findings suspicious for lung cancer on PET scan to determine if MRI findings suggestive of metastatic disease. -F/U appointment 12/23/2017  Dose of Decadron, if applicable: No  Recent neurologic symptoms, if any:   Seizures: No  Headaches: No  Nausea: Yes, mostly when she first wakes up in the morning.  Dizziness/ataxia: No, some off balance noted.  Difficulty with hand coordination: No  Focal numbness/weakness: No  Visual deficits/changes: No  Confusion/Memory deficits: No  Signs/Symptoms  Weight changes, if any: Lost about 18 pounds over the last 2 months, unintentional.  Respiratory complaints, if any: Uses inhaler, SOB noted with increased activity.  Hemoptysis, if any: Productive, clear phlegm.  Pain issues, if any: Yes  Loss of appetite.  BP (!) 188/67 (BP Location: Left Arm, Patient Position: Sitting)   Pulse 66   Temp 98.3 F (36.8 C) (Oral)   Resp 20   Ht 5\' 10"  (1.778 m)   Wt 140 lb 3.2 oz (63.6 kg)   SpO2 99%   BMI 20.12 kg/m    Wt Readings from Last 3 Encounters:  12/16/17 140 lb 3.2 oz (63.6 kg)  12/09/17 141 lb (64 kg)  12/05/17 138 lb 4.8 oz (62.7 kg)    SAFETY ISSUES:  Prior radiation? No  Pacemaker/ICD? No  Possible current pregnancy? Abdominal Hysterectomy  Is the patient on methotrexate? No  Additional Complaints / other details:

## 2017-12-15 NOTE — Progress Notes (Signed)
Spoke with pt for pre-op call. Pt denies cardiac history. States she is pre-diabetic. Does not check her blood sugar at home.

## 2017-12-16 ENCOUNTER — Ambulatory Visit
Admission: RE | Admit: 2017-12-16 | Discharge: 2017-12-16 | Disposition: A | Discharge status: Home / Self Care | Payer: Medicare HMO | Source: Ambulatory Visit | Attending: Radiation Oncology | Admitting: Radiation Oncology

## 2017-12-16 ENCOUNTER — Encounter (HOSPITAL_COMMUNITY): Payer: Self-pay | Admitting: General Practice

## 2017-12-16 ENCOUNTER — Encounter: Payer: Self-pay | Admitting: Radiation Oncology

## 2017-12-16 ENCOUNTER — Other Ambulatory Visit: Payer: Self-pay

## 2017-12-16 VITALS — BP 188/67 | HR 66 | Temp 98.3°F | Resp 20 | Ht 70.0 in | Wt 140.2 lb

## 2017-12-16 DIAGNOSIS — Z79899 Other long term (current) drug therapy: Secondary | ICD-10-CM | POA: Insufficient documentation

## 2017-12-16 DIAGNOSIS — Z7982 Long term (current) use of aspirin: Secondary | ICD-10-CM | POA: Diagnosis not present

## 2017-12-16 DIAGNOSIS — R918 Other nonspecific abnormal finding of lung field: Secondary | ICD-10-CM | POA: Diagnosis not present

## 2017-12-16 DIAGNOSIS — Z87891 Personal history of nicotine dependence: Secondary | ICD-10-CM | POA: Insufficient documentation

## 2017-12-16 DIAGNOSIS — D320 Benign neoplasm of cerebral meninges: Secondary | ICD-10-CM

## 2017-12-16 DIAGNOSIS — R7303 Prediabetes: Secondary | ICD-10-CM | POA: Diagnosis not present

## 2017-12-16 DIAGNOSIS — I10 Essential (primary) hypertension: Secondary | ICD-10-CM | POA: Diagnosis not present

## 2017-12-16 DIAGNOSIS — D4989 Neoplasm of unspecified behavior of other specified sites: Secondary | ICD-10-CM | POA: Diagnosis not present

## 2017-12-16 DIAGNOSIS — C383 Malignant neoplasm of mediastinum, part unspecified: Secondary | ICD-10-CM

## 2017-12-16 DIAGNOSIS — R9402 Abnormal brain scan: Secondary | ICD-10-CM | POA: Diagnosis not present

## 2017-12-16 NOTE — Progress Notes (Signed)
[facility] Psychosocial Distress Screening Clinical Social Work  Clinical Social Work was referred by distress screening protocol.  The patient scored a 8 on the Psychosocial Distress Thermometer which indicates moderate distress. Clinical Social Worker contacted patient by phone to assess for distress and other psychosocial needs. Patient is somewhat anxious about results of recent scans, and is awaiting biopsy tomorrow.  Husband died 01/14/2017.  Lives w son who works second shift - son moved back home to provide additional support to mother after husband's death.  Belongs to church, has limited support from a few friends.  Is able to drive, shop, take care of ADLs at this time.  Provided information on local support options including support group, cooking class and [place].  Information packet mailed.    ONCBCN DISTRESS SCREENING 12/16/2017  Screening Type Initial Screening  Distress experienced in past week (1-10) 8  Emotional problem type Adjusting to illness;Feeling hopeless;Adjusting to appearance changes  Spiritual/Religous concerns type Relating to God  Physical Problem type Pain;Breathing;Mouth sores/swallowing;Loss of appetitie    Clinical Social Worker follow up needed: No.  If yes, follow up plan:  Beverely Pace, [place], LCSW Clinical Social Worker Phone:  [phone]

## 2017-12-16 NOTE — Progress Notes (Signed)
Radiation Oncology         [phone] ________________________________  Name: Melinda Larsen        MRN: [id number]  Date of Service: 12/16/2017 DOB: 06/25/1940  CC:Redmond School, MD  Zoila Shutter, MD     REFERRING PHYSICIAN: Higgs, Mathis Dad, MD   DIAGNOSIS: The encounter diagnosis was Malignant neoplasm of mediastinum [facility]).   HISTORY OF PRESENT ILLNESS: Melinda Larsen is a 77 y.o. female seen at the request of Dr. Walden Field for probable lung cancer.  The patient started noticing increasing changes in shortness of breath, and was seen for evaluation.  She had also lost approximately 18 pounds, and during her work-up that included a CT scan on 11/16/2017 there was found to be a 4.4 x 4.4 cm left suprahilar soft tissue mass infiltrating into the superior mediastinum, of 1.5 cm left hilar node, enlarged left suprahilar/left upper lobe mass, multiple small nodules demonstrating change around the left upper lobe mass, as well as a 1.1 cm left upper lobe nodule, a 9 mm left upper lobe nodule, a 2.2 cm left upper lobe nodule, a 1.3 cm left lower lobe nodule, a 5 mm right lower lobe nodule, a 12 mm right upper lobe nodule, and multiple additional small nodules throughout the lungs bilaterally.  Tiny hyperenhancing regions within the left and right hepatic lobes were felt to be perfusion anomalies, and an indeterminate 2.1 cm left adrenal nodule is noted.  On 12/01/2017 she underwent an MRI scan with and without contrast of the brain which revealed no evidence of intracranial disease though there was a 6 mm homogeneously enhancing extra-axial mass in the right cerebral convexity in the posterior frontal lesion without region without mass-effect or edema.  She had a PET scan also on 12/01/2017 which revealed hypermetabolism within her right vocal cord likely encroachment upon the left laryngeal nerve, her left hilar mass had an SUV of 10.6 extending into the AP window, several satellite nodules in the left  upper lobe had SUVs of 4.9, and an anterior cluster of these with an SUV of 10.  There was hypermetabolism within the right lung with an SUV of 4.1 noted in the right upper lobe.  In the right lobe of the thyroid there was also hypermetabolism with an SUV of 4.9.  No evidence of hypermetabolism was seen in the adrenal glands.  She is planning to undergo a biopsy with bronchoscopy approach with Dr. Roxan Hockey tomorrow.  She comes today to discuss options of palliative radiotherapy as well as management of what is felt to be a meningioma on her MRI scan.     PREVIOUS RADIATION THERAPY: No   PAST MEDICAL HISTORY:  Past Medical History:  Diagnosis Date  . Anemia   . Borderline diabetes   . Dyspnea   . Hyperlipidemia   . Hypertension   . Pneumonia   . Pre-diabetes        PAST SURGICAL HISTORY: Past Surgical History:  Procedure Laterality Date  . ABDOMINAL HYSTERECTOMY    . APPENDECTOMY    . COLONOSCOPY N/A 07/16/2017   Procedure: COLONOSCOPY;  Surgeon: Danie Binder, MD;  Location: [facility];  Service: Endoscopy;  Laterality: N/A;  10:30  . POLYPECTOMY  07/16/2017   Procedure: POLYPECTOMY;  Surgeon: Danie Binder, MD;  Location: [facility];  Service: Endoscopy;;  cecal x2;hepatic flexurex5; splenic x1;descending x2     FAMILY HISTORY:  Family History  Problem Relation Age of Onset  . Hypertension Mother   .  Hypertension Father   . Colon cancer Neg Hx   . Colon polyps Neg Hx      SOCIAL HISTORY:  reports that she quit smoking about 5 years ago. Her smoking use included cigarettes. She has a 40.00 pack-year smoking history. She has never used smokeless tobacco. She reports that she does not drink alcohol or use drugs.   ALLERGIES: Codeine   MEDICATIONS:  Current Outpatient Medications  Medication Sig Dispense Refill  . albuterol (PROVENTIL HFA;VENTOLIN HFA) 108 (90 Base) MCG/ACT inhaler Inhale 2 puffs into the lungs every 4 (four) hours as needed for wheezing or  shortness of breath (or coughing). 1 Inhaler 0  . amLODipine (NORVASC) 5 MG tablet Take 5 mg by mouth every evening.     Marland Kitchen aspirin EC 81 MG tablet Take 81 mg by mouth every evening.     . benzonatate (TESSALON) 100 MG capsule Take 1 capsule (100 mg total) by mouth 3 (three) times daily as needed for cough. 15 capsule 0  . HYDROcodone-acetaminophen (NORCO/VICODIN) 5-325 MG tablet 1 or 2 tabs PO q12 hours prn pain (Patient taking differently: Take 1-2 tablets by mouth every 12 (twelve) hours as needed (pain.). ) 60 tablet 0  . lisinopril (PRINIVIL,ZESTRIL) 5 MG tablet Take 5 mg by mouth every evening.     . simvastatin (ZOCOR) 40 MG tablet Take 40 mg by mouth every evening.      No current facility-administered medications for this encounter.      REVIEW OF SYSTEMS: On review of systems, the patient reports that she is doing well overall. She denies any chest pain, cough, fevers, chills, night sweats.  She reports that she has been slightly fatigued and has lost approximately 18 to 20 pounds.  She has done very well however from a breathing perspective, and states her only episodes of shortness of breath occur when she is exerting herself.  She states that she is been able to walk up and down stairs without any difficulty, she walked downstairs down a long hallway to get to our clinic today and did fine with this as well.  She denies any hemoptysis.  She denies any bowel or bladder disturbances, and denies abdominal pain, nausea or vomiting.  She reports she has not had any recent headaches, denies any episodes of nausea, dizziness, or seizure activity.  She denies any new musculoskeletal or joint aches or pains. A complete review of systems is obtained and is otherwise negative.     PHYSICAL EXAM:  Wt Readings from Last 3 Encounters:  12/16/17 140 lb 3.2 oz (63.6 kg)  12/09/17 141 lb (64 kg)  12/05/17 138 lb 4.8 oz (62.7 kg)   Temp Readings from Last 3 Encounters:  12/16/17 98.3 F (36.8 C)  (Oral)  12/05/17 98.4 F (36.9 C) (Oral)  11/18/17 98.2 F (36.8 C) (Oral)   BP Readings from Last 3 Encounters:  12/16/17 (!) 188/67  12/09/17 (!) 170/64  12/05/17 (!) 183/54   Pulse Readings from Last 3 Encounters:  12/16/17 66  12/09/17 66  12/05/17 63   Pain Assessment Pain Score: 0-No pain/10  In general this is a well appearing African-American female in no acute distress. He is alert and oriented x4 and appropriate throughout the examination. HEENT reveals that the patient is normocephalic, atraumatic. EOMs are intact. Skin is intact without any evidence of gross lesions. Cardiovascular exam reveals a regular rate and rhythm, no clicks rubs or murmurs are auscultated. Chest is clear to auscultation bilaterally. Lymphatic  assessment is performed and does not reveal any adenopathy in the cervical, supraclavicular, axillary, or inguinal chains. Abdomen has active bowel sounds in all quadrants and is intact. The abdomen is soft, non tender, non distended. Lower extremities are negative for pretibial pitting edema, deep calf tenderness, cyanosis or clubbing.   ECOG = 1  0 - Asymptomatic (Fully active, able to carry on all predisease activities without restriction)  1 - Symptomatic but completely ambulatory (Restricted in physically strenuous activity but ambulatory and able to carry out work of a light or sedentary nature. For example, light housework, office work)  2 - Symptomatic, <50% in bed during the day (Ambulatory and capable of all self care but unable to carry out any work activities. Up and about more than 50% of waking hours)  3 - Symptomatic, >50% in bed, but not bedbound (Capable of only limited self-care, confined to bed or chair 50% or more of waking hours)  4 - Bedbound (Completely disabled. Cannot carry on any self-care. Totally confined to bed or chair)  5 - Death   Eustace Pen MM, Creech RH, Tormey DC, et al. [phone]). "Toxicity and response criteria of the [facility]". Anselmo Oncol. 5 (6): 649-55    LABORATORY DATA:  Lab Results  Component Value Date   WBC 4.6 11/16/2017   HGB 13.5 11/16/2017   HCT 44.1 11/16/2017   MCV 86.3 11/16/2017   PLT 301 11/16/2017   Lab Results  Component Value Date   NA 137 11/16/2017   K 3.6 11/16/2017   CL 104 11/16/2017   CO2 26 11/16/2017   Lab Results  Component Value Date   ALT 10 11/16/2017   AST 19 11/16/2017   ALKPHOS 51 11/16/2017   BILITOT 1.0 11/16/2017      RADIOGRAPHY: Ct Angio Chest Pe W/cm &/or Wo Cm  Result Date: 11/16/2017 CLINICAL DATA:  Patient with productive cough. EXAM: CT ANGIOGRAPHY CHEST WITH CONTRAST TECHNIQUE: Multidetector CT imaging of the chest was performed using the standard protocol during bolus administration of intravenous contrast. Multiplanar CT image reconstructions and MIPs were obtained to evaluate the vascular anatomy. CONTRAST:  19mL ISOVUE-370 IOPAMIDOL (ISOVUE-370) INJECTION 76% COMPARISON:  Chest radiograph 11/16/2017 FINDINGS: Cardiovascular: Normal heart size. Trace fluid superior pericardial recess. Thoracic aortic vascular calcifications. Contrast opacifies the pulmonary arterial system. No evidence for acute pulmonary embolus. Left upper lobe mass narrows the left upper lobe pulmonary arteries. Mediastinum/Nodes: There is a 4.5 x 4.5 cm left suprahilar soft tissue mass which infiltrates into a portion of the superior mediastinum. There is a 1.5 cm left hilar node (image 52; series 6). No axillary adenopathy. Normal appearance of the esophagus. Lungs/Pleura: Central airways are patent. Centrilobular and paraseptal emphysematous change. Large left suprahilar/left upper lobe mass. Multiple small nodules are demonstrated surrounding the left upper lobe mass. Additionally there is a 1.1 cm left upper lobe nodule (image 36; series 8). A 0.9 cm left upper lobe nodule (image 39; series 8). A 2.2 cm left upper lobe nodule (image 63; series 8). A  1.3 cm left lower lobe nodule (image 109; series 8). A 0.5 cm right lower lobe nodule (image 59; series 8). A 1.2 cm right upper lobe nodule (image 65; series 8). Multiple additional small nodules are demonstrated throughout the lungs bilaterally. No pleural effusion or pneumothorax. Upper Abdomen: Tiny hyperenhancing regions within the left and right hepatic lobes (image 103; series 6) (image 89; series 6), likely small perfusion anomalies. Indeterminate 2.1 cm left  adrenal nodule (image 99; series 6). Musculoskeletal: No aggressive or acute appearing osseous lesions. Review of the MIP images confirms the above findings. IMPRESSION: 1. 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 There are multiple adjacent nodules within the left upper lobe as well as scattered nodules throughout the lungs bilaterally concerning for metastatic disease. Left hilar adenopathy concerning for metastatic disease. 2. Indeterminate left adrenal nodule. 3. No evidence for acute pulmonary embolus. 4. Aortic Atherosclerosis (ICD10-I70.0) and Emphysema (ICD10-J43.9). Electronically Signed   By: Lovey Newcomer M.D.   On: 11/16/2017 15:48   Mr Jeri Cos TI Contrast  Result Date: 12/01/2017 CLINICAL DATA:  Headaches for 3 months.  Left suprahilar lung mass. EXAM: MRI HEAD WITHOUT AND WITH CONTRAST TECHNIQUE: Multiplanar, multiecho pulse sequences of the brain and surrounding structures were obtained without and with intravenous contrast. CONTRAST:  6 mL Gadavist COMPARISON:  None. FINDINGS: Brain: There is no evidence of acute infarct, intracranial hemorrhage, midline shift, or extra-axial fluid collection. The ventricles and sulci are within normal limits for age. Small foci of T2 hyperintensity in the cerebral white matter most notable in the left parietal lobe and are nonspecific but compatible with mild chronic small vessel ischemic disease. There are small chronic infarcts in the left cerebellum. A 6 mm  homogeneously enhancing extra-axial mass is noted over the right cerebral convexity in the posterior frontal region without associated mass effect or edema (series 13, image 71). No abnormal enhancement is identified elsewhere. Vascular: Major intracranial vascular flow voids are preserved. Skull and upper cervical spine: No suspicious marrow lesion. Sinuses/Orbits: Left cataract extraction. Small maxillary sinuses. No evidence of acute sinusitis or mastoid fluid. Other: None. IMPRESSION: 1. 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 2. No other enhancing lesions to suggest metastatic disease. 3. No acute intracranial abnormality. 4. Mild chronic small vessel ischemic disease. 5. Chronic cerebellar infarcts. Electronically Signed   By: Logan Bores M.D.   On: 12/01/2017 17:09   Nm Pet Image Initial (pi) Skull Base To Thigh  Result Date: 12/02/2017 CLINICAL DATA:  Initial treatment strategy for lung mass. EXAM: NUCLEAR MEDICINE PET SKULL BASE TO THIGH TECHNIQUE: 7.7 mCi F-18 FDG was injected intravenously. Full-ring PET imaging was performed from the skull base to thigh after the radiotracer. CT data was obtained and used for attenuation correction and anatomic localization. Fasting blood glucose: 93 mg/dl COMPARISON:  Chest CT 11/16/2017 FINDINGS: Mediastinal blood pool activity: SUV max 2.1 NECK: Asymmetric activity in the larynx with the RIGHT vocal cord hypermetabolic compared to the LEFT. This presumably reflects encroachment upon the LEFT recurrent laryngeal nerve by the LEFT hilar tumor. No hypermetabolic lymph nodes in the neck Incidental CT findings: none CHEST: LEFT hilar mass measuring 4.3 x 4.3 cm with SUV max equal 10.6. Mass extends into the AP window likely involves the LEFT recurrent laryngeal nerve as above. There are several satellite nodules superior to the LEFT hilar mass. Example 9 mm LEFT upper lobe nodule with SUV max equal 4.9 on image  79 of fused data set. Additionally there is a cluster of nodules in the anterior LEFT upper lobe measuring 3.5 cm in conglomerate with SUV max equal 10.0. Within the RIGHT lung there is a hypermetabolic nodule peripherally in the upper lobe measuring 6 mm with SUV max equal 4.1. No hypermetabolic axillary lymph nodes no hypermetabolic supraclavicular nodes. Incidental finding of a hypermetabolic nodule within the RIGHT lobe of thyroid gland with SUV max equal 4.9.  This nodule measures 10 mm on the CT portion Incidental CT findings: none ABDOMEN/PELVIS: No abnormal hypermetabolic activity within the liver, pancreas, adrenal glands, or spleen. No hypermetabolic lymph nodes in the abdomen or pelvis. Incidental CT findings: none SKELETON: No focal hypermetabolic activity to suggest skeletal metastasis. Incidental CT findings: none IMPRESSION: 1. Hypermetabolic LEFT hilar mass which encroaches upon the AP window. 2. Nodularity in the anterior LEFT upper lobe and superior LEFT upper lobe consistent with primary or metastatic lung cancer. 3. Hypermetabolic metastatic pulmonary nodule within the RIGHT upper lobe. 4. 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 5. 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 6. No evidence of metastatic lung carcinoma outside the thorax. Electronically Signed   By: Suzy Bouchard M.D.   On: 12/02/2017 10:00       IMPRESSION/PLAN: 1. Probable stage IV lung cancer.  The patient is undergoing a work-up at this moment, and is scheduled to undergo biopsy tomorrow.  She does have bilateral disease however.  We will follow her expectantly through her biopsy and intervene with palliative radiotherapy as needed however at this time she does not appear to have symptomatology to require palliation of her tumor.  We could  always consider this at a later date.  Would be happy to follow-up with her as needed. 2.   Probable meningioma.  Patient does not have a long-standing history of frequent x-ray imaging or dental x-rays.  She is not symptomatic at this time, and we discussed the options of repeating an MRI scan in approximately 2 months time, presenting her in conference, and provided that there does not appear to be any association with her cancer, we would refer her to Dr. Mickeal Skinner for long-term management.  She is in agreement with this plan.  In a visit lasting 45 minutes, greater than 50% of the time was spent face to face discussing her case, and coordinating the patient's care.   The above documentation reflects my direct findings during this shared patient visit. Please see the separate note by Dr. Lisbeth Renshaw on this date for the remainder of the patient's plan of care.    Carola Rhine, PAC

## 2017-12-16 NOTE — Addendum Note (Signed)
Encounter addended by: Cori Razor, RN on: 12/16/2017 10:05 AM  Actions taken: Visit diagnoses modified

## 2017-12-17 ENCOUNTER — Ambulatory Visit (HOSPITAL_COMMUNITY): Payer: Medicare HMO

## 2017-12-17 ENCOUNTER — Ambulatory Visit (HOSPITAL_COMMUNITY)
Admission: RE | Admit: 2017-12-17 | Discharge: 2017-12-17 | Disposition: A | Discharge status: Home / Self Care | Payer: Medicare HMO | Source: Ambulatory Visit | Attending: Thoracic Surgery (Cardiothoracic Vascular Surgery) | Admitting: Thoracic Surgery (Cardiothoracic Vascular Surgery)

## 2017-12-17 ENCOUNTER — Ambulatory Visit (HOSPITAL_COMMUNITY): Payer: Medicare HMO | Admitting: Certified Registered Nurse Anesthetist

## 2017-12-17 ENCOUNTER — Encounter (HOSPITAL_COMMUNITY)
Admission: RE | Disposition: A | Discharge status: Home / Self Care | Payer: Self-pay | Source: Ambulatory Visit | Attending: Thoracic Surgery (Cardiothoracic Vascular Surgery)

## 2017-12-17 ENCOUNTER — Other Ambulatory Visit: Payer: Self-pay

## 2017-12-17 ENCOUNTER — Encounter (HOSPITAL_COMMUNITY): Payer: Self-pay

## 2017-12-17 DIAGNOSIS — Z87891 Personal history of nicotine dependence: Secondary | ICD-10-CM | POA: Insufficient documentation

## 2017-12-17 DIAGNOSIS — R918 Other nonspecific abnormal finding of lung field: Secondary | ICD-10-CM | POA: Diagnosis not present

## 2017-12-17 DIAGNOSIS — E785 Hyperlipidemia, unspecified: Secondary | ICD-10-CM | POA: Diagnosis not present

## 2017-12-17 DIAGNOSIS — Z885 Allergy status to narcotic agent status: Secondary | ICD-10-CM | POA: Insufficient documentation

## 2017-12-17 DIAGNOSIS — C3492 Malignant neoplasm of unspecified part of left bronchus or lung: Secondary | ICD-10-CM | POA: Diagnosis not present

## 2017-12-17 DIAGNOSIS — C3412 Malignant neoplasm of upper lobe, left bronchus or lung: Secondary | ICD-10-CM | POA: Diagnosis not present

## 2017-12-17 DIAGNOSIS — I10 Essential (primary) hypertension: Secondary | ICD-10-CM | POA: Diagnosis not present

## 2017-12-17 DIAGNOSIS — C3400 Malignant neoplasm of unspecified main bronchus: Secondary | ICD-10-CM | POA: Diagnosis not present

## 2017-12-17 DIAGNOSIS — Z79899 Other long term (current) drug therapy: Secondary | ICD-10-CM | POA: Insufficient documentation

## 2017-12-17 DIAGNOSIS — I700 Atherosclerosis of aorta: Secondary | ICD-10-CM | POA: Diagnosis not present

## 2017-12-17 DIAGNOSIS — Z7982 Long term (current) use of aspirin: Secondary | ICD-10-CM | POA: Diagnosis not present

## 2017-12-17 DIAGNOSIS — C7802 Secondary malignant neoplasm of left lung: Secondary | ICD-10-CM | POA: Diagnosis not present

## 2017-12-17 DIAGNOSIS — C3402 Malignant neoplasm of left main bronchus: Secondary | ICD-10-CM | POA: Diagnosis not present

## 2017-12-17 DIAGNOSIS — J439 Emphysema, unspecified: Secondary | ICD-10-CM | POA: Diagnosis not present

## 2017-12-17 DIAGNOSIS — C771 Secondary and unspecified malignant neoplasm of intrathoracic lymph nodes: Secondary | ICD-10-CM | POA: Diagnosis not present

## 2017-12-17 DIAGNOSIS — R7303 Prediabetes: Secondary | ICD-10-CM | POA: Insufficient documentation

## 2017-12-17 DIAGNOSIS — C3490 Malignant neoplasm of unspecified part of unspecified bronchus or lung: Secondary | ICD-10-CM | POA: Diagnosis not present

## 2017-12-17 HISTORY — DX: Anemia, unspecified: D64.9

## 2017-12-17 HISTORY — DX: Pneumonia, unspecified organism: J18.9

## 2017-12-17 HISTORY — DX: Dyspnea, unspecified: R06.00

## 2017-12-17 HISTORY — PX: VIDEO BRONCHOSCOPY WITH ENDOBRONCHIAL ULTRASOUND: SHX6177

## 2017-12-17 HISTORY — DX: Prediabetes: R73.03

## 2017-12-17 LAB — COMPREHENSIVE METABOLIC PANEL
ALT: 9 U/L (ref 0–44)
AST: 18 U/L (ref 15–41)
Albumin: 3.6 g/dL (ref 3.5–5.0)
Alkaline Phosphatase: 53 U/L (ref 38–126)
Anion gap: 7 (ref 5–15)
BUN: 7 mg/dL — AB (ref 8–23)
CO2: 27 mmol/L (ref 22–32)
CREATININE: 0.68 mg/dL (ref 0.44–1.00)
Calcium: 9.5 mg/dL (ref 8.9–10.3)
Chloride: 103 mmol/L (ref 98–111)
GFR calc Af Amer: >60 mL/min (ref >60)
GFR calc non Af Amer: >60 mL/min (ref >60)
Glucose, Bld: 106 mg/dL — ABNORMAL HIGH (ref 70–99)
Potassium: 3.6 mmol/L (ref 3.5–5.1)
SODIUM: 137 mmol/L (ref 135–145)
Total Bilirubin: 0.7 mg/dL (ref 0.3–1.2)
Total Protein: 7.6 g/dL (ref 6.5–8.1)

## 2017-12-17 LAB — CBC
HCT: 46 % (ref 36.0–46.0)
HEMOGLOBIN: 13.2 g/dL (ref 12.0–15.0)
MCH: 24.9 pg — ABNORMAL LOW (ref 26.0–34.0)
MCHC: 28.7 g/dL — ABNORMAL LOW (ref 30.0–36.0)
MCV: 86.8 fL (ref 80.0–100.0)
NRBC: 0 % (ref 0.0–0.2)
Platelets: 394 10*3/uL (ref 150–400)
RBC: 5.3 MIL/uL — AB (ref 3.87–5.11)
RDW: 12.8 % (ref 11.5–15.5)
WBC: 6.5 10*3/uL (ref 4.0–10.5)

## 2017-12-17 LAB — PROTIME-INR
INR: 1.05
Prothrombin Time: 13.6 seconds (ref 11.4–15.2)

## 2017-12-17 LAB — APTT: APTT: 31 s (ref 24–36)

## 2017-12-17 LAB — GLUCOSE, CAPILLARY: Glucose-Capillary: 99 mg/dL (ref 70–99)

## 2017-12-17 SURGERY — BRONCHOSCOPY, WITH EBUS
Anesthesia: General | Site: Bronchus

## 2017-12-17 MED ORDER — PHENYLEPHRINE 40 MCG/ML (10ML) SYRINGE FOR IV PUSH (FOR BLOOD PRESSURE SUPPORT)
PREFILLED_SYRINGE | INTRAVENOUS | Status: AC
  Filled 2017-12-17: qty 10

## 2017-12-17 MED ORDER — FENTANYL CITRATE (PF) 250 MCG/5ML IJ SOLN
INTRAMUSCULAR | Status: AC
  Filled 2017-12-17: qty 5

## 2017-12-17 MED ORDER — EPINEPHRINE PF 1 MG/ML IJ SOLN
INTRAMUSCULAR | Status: DC | PRN
  Administered 2017-12-17: 1 mg via ENDOTRACHEOPULMONARY

## 2017-12-17 MED ORDER — PROPOFOL 10 MG/ML IV BOLUS
INTRAVENOUS | Status: DC | PRN
  Administered 2017-12-17: 50 mg via INTRAVENOUS
  Administered 2017-12-17: 150 mg via INTRAVENOUS

## 2017-12-17 MED ORDER — ONDANSETRON HCL 4 MG/2ML IJ SOLN
INTRAMUSCULAR | Status: DC | PRN
  Administered 2017-12-17: 4 mg via INTRAVENOUS

## 2017-12-17 MED ORDER — SUGAMMADEX SODIUM 200 MG/2ML IV SOLN
INTRAVENOUS | Status: DC | PRN
  Administered 2017-12-17: 200 mg via INTRAVENOUS

## 2017-12-17 MED ORDER — ROCURONIUM BROMIDE 10 MG/ML (PF) SYRINGE
PREFILLED_SYRINGE | INTRAVENOUS | Status: DC | PRN
  Administered 2017-12-17: 50 mg via INTRAVENOUS

## 2017-12-17 MED ORDER — SUGAMMADEX SODIUM 200 MG/2ML IV SOLN
INTRAVENOUS | Status: AC
  Filled 2017-12-17: qty 2

## 2017-12-17 MED ORDER — EPINEPHRINE PF 1 MG/ML IJ SOLN
INTRAMUSCULAR | Status: AC
  Filled 2017-12-17: qty 1

## 2017-12-17 MED ORDER — ONDANSETRON HCL 4 MG/2ML IJ SOLN
INTRAMUSCULAR | Status: AC
  Filled 2017-12-17: qty 2

## 2017-12-17 MED ORDER — FENTANYL CITRATE (PF) 100 MCG/2ML IJ SOLN
INTRAMUSCULAR | Status: DC | PRN
  Administered 2017-12-17: 150 ug via INTRAVENOUS

## 2017-12-17 MED ORDER — LIDOCAINE 2% (20 MG/ML) 5 ML SYRINGE
INTRAMUSCULAR | Status: AC
  Filled 2017-12-17: qty 5

## 2017-12-17 MED ORDER — LIDOCAINE 2% (20 MG/ML) 5 ML SYRINGE
INTRAMUSCULAR | Status: DC | PRN
  Administered 2017-12-17: 100 mg via INTRAVENOUS

## 2017-12-17 MED ORDER — PHENYLEPHRINE 40 MCG/ML (10ML) SYRINGE FOR IV PUSH (FOR BLOOD PRESSURE SUPPORT)
PREFILLED_SYRINGE | INTRAVENOUS | Status: DC | PRN
  Administered 2017-12-17: 40 ug via INTRAVENOUS

## 2017-12-17 MED ORDER — 0.9 % SODIUM CHLORIDE (POUR BTL) OPTIME
TOPICAL | Status: DC | PRN
  Administered 2017-12-17: 1000 mL

## 2017-12-17 MED ORDER — ROCURONIUM BROMIDE 50 MG/5ML IV SOSY
PREFILLED_SYRINGE | INTRAVENOUS | Status: AC
  Filled 2017-12-17: qty 5

## 2017-12-17 MED ORDER — PROPOFOL 10 MG/ML IV BOLUS
INTRAVENOUS | Status: AC
  Filled 2017-12-17: qty 20

## 2017-12-17 MED ORDER — LACTATED RINGERS IV SOLN
INTRAVENOUS | Status: DC
  Administered 2017-12-17: 10:00:00 via INTRAVENOUS

## 2017-12-17 SURGICAL SUPPLY — 37 items
ADAPTER VALVE BIOPSY EBUS (MISCELLANEOUS) IMPLANT
ADPTR VALVE BIOPSY EBUS (MISCELLANEOUS)
BRUSH CYTOL CELLEBRITY 1.5X140 (MISCELLANEOUS) ×3 IMPLANT
CANISTER SUCT 3000ML PPV (MISCELLANEOUS) ×3 IMPLANT
CONT SPEC 4OZ CLIKSEAL STRL BL (MISCELLANEOUS) ×3 IMPLANT
COVER BACK TABLE 60X90IN (DRAPES) ×3 IMPLANT
COVER WAND RF STERILE (DRAPES) IMPLANT
FILTER STRAW FLUID ASPIR (MISCELLANEOUS) ×3 IMPLANT
FORCEPS BIOP RJ4 1.8 (CUTTING FORCEPS) ×3 IMPLANT
FORCEPS RADIAL JAW LRG 4 PULM (INSTRUMENTS) IMPLANT
GAUZE SPONGE 4X4 12PLY STRL (GAUZE/BANDAGES/DRESSINGS) IMPLANT
GLOVE SURG SIGNA 7.5 PF LTX (GLOVE) ×3 IMPLANT
GOWN STRL REUS W/ TWL XL LVL3 (GOWN DISPOSABLE) ×1 IMPLANT
GOWN STRL REUS W/TWL XL LVL3 (GOWN DISPOSABLE) ×2
KIT CLEAN ENDO COMPLIANCE (KITS) ×6 IMPLANT
KIT TURNOVER KIT B (KITS) ×3 IMPLANT
MARKER SKIN DUAL TIP RULER LAB (MISCELLANEOUS) ×3 IMPLANT
NEEDLE ASPIRATION VIZISHOT 19G (NEEDLE) ×3 IMPLANT
NEEDLE BLUNT 18X1 FOR OR ONLY (NEEDLE) IMPLANT
NS IRRIG 1000ML POUR BTL (IV SOLUTION) ×3 IMPLANT
OIL SILICONE PENTAX (PARTS (SERVICE/REPAIRS)) ×3 IMPLANT
PAD ARMBOARD 7.5X6 YLW CONV (MISCELLANEOUS) ×3 IMPLANT
RADIAL JAW LRG 4 PULMONARY (INSTRUMENTS)
SYR 20CC LL (SYRINGE) ×3 IMPLANT
SYR 20ML ECCENTRIC (SYRINGE) IMPLANT
SYR 3ML LL SCALE MARK (SYRINGE) IMPLANT
SYR 5ML LL (SYRINGE) ×3 IMPLANT
SYR 5ML LUER SLIP (SYRINGE) ×3 IMPLANT
TOWEL GREEN STERILE (TOWEL DISPOSABLE) IMPLANT
TOWEL GREEN STERILE FF (TOWEL DISPOSABLE) ×3 IMPLANT
TRAP SPECIMEN MUCOUS 40CC (MISCELLANEOUS) ×3 IMPLANT
TUBE CONNECTING 20'X1/4 (TUBING) ×1
TUBE CONNECTING 20X1/4 (TUBING) ×2 IMPLANT
VALVE BIOPSY  SINGLE USE (MISCELLANEOUS) ×2
VALVE BIOPSY SINGLE USE (MISCELLANEOUS) ×1 IMPLANT
VALVE SUCTION BRONCHIO DISP (MISCELLANEOUS) ×3 IMPLANT
WATER STERILE IRR 1000ML POUR (IV SOLUTION) ×3 IMPLANT

## 2017-12-17 NOTE — Anesthesia Preprocedure Evaluation (Signed)
Anesthesia Evaluation  Patient identified by MRN, date of birth, ID band Patient awake    Reviewed: Allergy & Precautions, NPO status , Patient's Chart, lab work & pertinent test results  Airway Mallampati: I  TM Distance: >3 FB Neck ROM: Full    Dental   Pulmonary former smoker,    Pulmonary exam normal        Cardiovascular hypertension, Pt. on medications Normal cardiovascular exam     Neuro/Psych    GI/Hepatic   Endo/Other    Renal/GU      Musculoskeletal   Abdominal   Peds  Hematology   Anesthesia Other Findings   Reproductive/Obstetrics                             Anesthesia Physical Anesthesia Plan  ASA: III  Anesthesia Plan: General   Post-op Pain Management:    Induction: Intravenous  PONV Risk Score and Plan: 3 and Ondansetron, Dexamethasone and Midazolam  Airway Management Planned: Oral ETT  Additional Equipment:   Intra-op Plan:   Post-operative Plan: Extubation in OR  Informed Consent: I have reviewed the patients History and Physical, chart, labs and discussed the procedure including the risks, benefits and alternatives for the proposed anesthesia with the patient or authorized representative who has indicated his/her understanding and acceptance.     Plan Discussed with: CRNA and Surgeon  Anesthesia Plan Comments:         Anesthesia Quick Evaluation

## 2017-12-17 NOTE — Anesthesia Postprocedure Evaluation (Signed)
Anesthesia Post Note  Patient: Melinda Larsen  Procedure(s) Performed: VIDEO BRONCHOSCOPY WITH ENDOBRONCHIAL ULTRASOUND (N/A Bronchus)     Patient location during evaluation: PACU Anesthesia Type: General Level of consciousness: awake and alert Pain management: pain level controlled Vital Signs Assessment: post-procedure vital signs reviewed and stable Respiratory status: spontaneous breathing, nonlabored ventilation, respiratory function stable and patient connected to nasal cannula oxygen Cardiovascular status: blood pressure returned to baseline and stable Postop Assessment: no apparent nausea or vomiting Anesthetic complications: no    Last Vitals:  Vitals:   12/17/17 1415 12/17/17 1430  BP: (!) 172/54 (!) 172/62  Pulse: 68 64  Resp: 17 19  Temp:  36.5 C  SpO2: 93% 95%    Last Pain:  Vitals:   12/17/17 1430  TempSrc:   PainSc: 0-No pain                 Laure Leone DAVID

## 2017-12-17 NOTE — Transfer of Care (Signed)
Immediate Anesthesia Transfer of Care Note  Patient: Melinda Larsen  Procedure(s) Performed: VIDEO BRONCHOSCOPY WITH ENDOBRONCHIAL ULTRASOUND (N/A Bronchus)  Patient Location: PACU  Anesthesia Type:General  Level of Consciousness: awake, patient cooperative and responds to stimulation  Airway & Oxygen Therapy: Patient Spontanous Breathing and Patient connected to face mask oxygen  Post-op Assessment: Report given to RN and Post -op Vital signs reviewed and stable  Post vital signs: Reviewed and stable  Last Vitals:  Vitals Value Taken Time  BP    Temp    Pulse 87 12/17/2017  1:58 PM  Resp 14 12/17/2017  1:58 PM  SpO2 91 % 12/17/2017  1:58 PM  Vitals shown include unvalidated device data.  Last Pain:  Vitals:   12/17/17 1011  TempSrc:   PainSc: 0-No pain         Complications: No apparent anesthesia complications

## 2017-12-17 NOTE — Interval H&P Note (Signed)
History and Physical Interval Note:  12/17/2017 11:21 AM  Tammy Sours  has presented today for surgery, with the diagnosis of left hilar mass  The various methods of treatment have been discussed with the patient and family. After consideration of risks, benefits and other options for treatment, the patient has consented to  Procedure(s): Effie (N/A) as a surgical intervention .  The patient's history has been reviewed, patient examined, no change in status, stable for surgery.  I have reviewed the patient's chart and labs.  Questions were answered to the patient's satisfaction.     Melrose Nakayama

## 2017-12-17 NOTE — Brief Op Note (Signed)
12/17/2017  1:58 PM  PATIENT:  Melinda Larsen  77 y.o. female  PRE-OPERATIVE DIAGNOSIS:  left hilar mass, probable stage IV (T4,N3,M1) lung cancer  POST-OPERATIVE DIAGNOSIS:  left hilar mass, + malignancy (T4,N3,M1)   PROCEDURE:  Procedure(s): VIDEO BRONCHOSCOPY WITH ENDOBRONCHIAL ULTRASOUND (N/A)  Brushings, needle aspirations and endobronchial biopsies  SURGEON:  Surgeon(s) and Role:    * Melrose Nakayama, MD - Primary  PHYSICIAN ASSISTANT:   ASSISTANTS: none   ANESTHESIA:   general  EBL:  minimal  BLOOD ADMINISTERED:none  DRAINS: none   LOCAL MEDICATIONS USED:  NONE  SPECIMEN:  Source of Specimen:  LUL mass, level 11 node  DISPOSITION OF SPECIMEN:  PATHOLOGY  COUNTS:  NO endoscopic  TOURNIQUET:  * No tourniquets in log *  DICTATION: .Other Dictation: Dictation Number -  PLAN OF CARE: Discharge to home after PACU  PATIENT DISPOSITION:  PACU - hemodynamically stable.   Delay start of Pharmacological VTE agent (>24hrs) due to surgical blood loss or risk of bleeding: not applicable

## 2017-12-17 NOTE — Discharge Instructions (Signed)
Do not drive or engage in heavy physical activity for 24 hours  You may resume normal activities tomorrow  You may cough up small amounts of blood over the next few days  Call [phone] if you develop chest pain, shortness of breath, fever > 101F or cough up more than 2 tablespoons of blood  Follow up as scheduled with Dr. Walden Field

## 2017-12-18 ENCOUNTER — Encounter (HOSPITAL_COMMUNITY): Payer: Self-pay | Admitting: Thoracic Surgery (Cardiothoracic Vascular Surgery)

## 2017-12-18 NOTE — Op Note (Signed)
NAMEKASSIDIE, HENDRIKS MEDICAL RECORD JO:[id number] ACCOUNT [id number] DATE OF BIRTH:09-Nov-1940 FACILITY: [facility] LOCATION: [facility] PHYSICIAN:Joscelyne Renville C. Seri Kimmer, MD  OPERATIVE REPORT  DATE OF PROCEDURE:  12/17/2017  PREOPERATIVE DIAGNOSIS:  Left hilar mass, probable stage IV (T4, N3, M1) lung cancer.  POSTOPERATIVE DIAGNOSIS:  Malignancy left hilum stage IV (T4, N3, N1).  PROCEDURE:   1.Bronchoscopy with brushings and endobronchial biopsies and 2.Endobronchial ultrasound with hilar lymph node aspirations.  SURGEON:  Modesto Charon, MD  ANESTHESIA:  General.  FINDINGS:  Edematous mucosa and narrowing of the left upper lobe bronchus.  Initial brushings showed atypical cells.  Needle aspirations of the level 11 node were diagnostic for malignancy.  Multiple biopsies taken.  CLINICAL NOTE:  Ms. Dingus is a 77 year old woman with a past history of tobacco abuse who presented with shortness of breath, weight loss and general fatigue.  She also had a persistent cough.  Workup revealed a 4.5 cm left upper lobe mass with  invasion of the mediastinum and hilar and mediastinal adenopathy as well as a contralateral lung nodule.   Findings were suspicious for a T4, N2, M1 stage IV lung cancer.  She was advised to undergo bronchoscopy and endobronchial ultrasound for diagnostic purposes.  The indications, risks, benefits, and alternatives were discussed in detail with the patient.   She understood and accepted the risks and agreed to proceed.  OPERATIVE NOTE:  The patient was brought to the operating room on 12/17/2017.  She had induction of general anesthesia and was intubated.  After performing a timeout, flexible fiberoptic bronchoscopy was performed via the endotracheal tube.  On the  right, there was normal endobronchial anatomy and no endobronchial lesions to the level of the subsegmental bronchi.  On the left side around the left upper lobe bronchus, there was significant edema in the  mucosa, but no definite tumor was seen.   Brushings were obtained, and the bronchoscope was removed.  While the stains were being done on the brushings, the endobronchial ultrasound scope was advanced and aspirations were performed of a level 11 node.  The brushings showed atypical cells but  not definite for malignancy.  With the aspirations of the node, the needle was advanced into the node with ultrasound visualization, and 15 to 20 passes were made with each aspiration.  Aspirations were obtained both with and without suction applied.   The first 2 aspirations were placed onto slides. The remaining aspirations were placed completely into cell block.  The slides were sent for Quick prep as well.  While awaiting those results, the endobronchial ultrasound probe was removed.  The bronchoscope was reinserted, and  multiple biopsies were taken from the left upper lobe bronchus.  Underneath the edematous mucosa, there appeared to be gross tumor, particularly at the left main carina.  Numerous biopsies were obtained from this area.  There was some mild bleeding  that was easily controlled with dilute epinephrine.  The Quick stains on the needle aspiration showed a definite malignancy.  Final inspection was made with the bronchoscope.  There was no ongoing bleeding.  The scope was removed.  The patient was  extubated in the operating room and taken to the [facility] in good condition.  LN/NUANCE  D:12/17/2017 T:12/18/2017 JOB:[id number]

## 2017-12-23 ENCOUNTER — Other Ambulatory Visit (HOSPITAL_COMMUNITY): Payer: Self-pay | Admitting: *Deleted

## 2017-12-23 ENCOUNTER — Other Ambulatory Visit (HOSPITAL_COMMUNITY): Payer: Self-pay | Admitting: Pharmacist

## 2017-12-23 ENCOUNTER — Other Ambulatory Visit (HOSPITAL_COMMUNITY): Payer: Self-pay | Admitting: Internal Medicine

## 2017-12-23 ENCOUNTER — Encounter (HOSPITAL_COMMUNITY): Payer: Self-pay | Admitting: Internal Medicine

## 2017-12-23 ENCOUNTER — Other Ambulatory Visit: Payer: Self-pay

## 2017-12-23 ENCOUNTER — Inpatient Hospital Stay (HOSPITAL_BASED_OUTPATIENT_CLINIC_OR_DEPARTMENT_OTHER): Payer: Medicare HMO | Admitting: Internal Medicine

## 2017-12-23 ENCOUNTER — Other Ambulatory Visit: Payer: Self-pay | Admitting: Radiology

## 2017-12-23 DIAGNOSIS — R918 Other nonspecific abnormal finding of lung field: Secondary | ICD-10-CM

## 2017-12-23 DIAGNOSIS — Z7982 Long term (current) use of aspirin: Secondary | ICD-10-CM

## 2017-12-23 DIAGNOSIS — R911 Solitary pulmonary nodule: Secondary | ICD-10-CM

## 2017-12-23 DIAGNOSIS — I700 Atherosclerosis of aorta: Secondary | ICD-10-CM

## 2017-12-23 DIAGNOSIS — C3490 Malignant neoplasm of unspecified part of unspecified bronchus or lung: Secondary | ICD-10-CM

## 2017-12-23 DIAGNOSIS — G939 Disorder of brain, unspecified: Secondary | ICD-10-CM | POA: Diagnosis not present

## 2017-12-23 DIAGNOSIS — I10 Essential (primary) hypertension: Secondary | ICD-10-CM | POA: Diagnosis not present

## 2017-12-23 DIAGNOSIS — C3480 Malignant neoplasm of overlapping sites of unspecified bronchus and lung: Secondary | ICD-10-CM

## 2017-12-23 DIAGNOSIS — Z79899 Other long term (current) drug therapy: Secondary | ICD-10-CM | POA: Diagnosis not present

## 2017-12-23 DIAGNOSIS — J439 Emphysema, unspecified: Secondary | ICD-10-CM

## 2017-12-23 DIAGNOSIS — F17210 Nicotine dependence, cigarettes, uncomplicated: Secondary | ICD-10-CM | POA: Diagnosis not present

## 2017-12-23 DIAGNOSIS — R0789 Other chest pain: Secondary | ICD-10-CM | POA: Diagnosis not present

## 2017-12-23 HISTORY — DX: Malignant neoplasm of unspecified part of unspecified bronchus or lung: C34.90

## 2017-12-23 MED ORDER — FOLIC ACID 1 MG PO TABS: 1.0000 mg | ORAL_TABLET | Freq: Every day | ORAL | 2 refills | Status: DC

## 2017-12-23 MED ORDER — CYANOCOBALAMIN 1000 MCG/ML IJ SOLN
INTRAMUSCULAR | Status: AC
  Filled 2017-12-23: qty 1

## 2017-12-23 MED ORDER — FOLIC ACID 1 MG PO TABS: 1.0000 mg | ORAL_TABLET | Freq: Every day | ORAL | 3 refills | Status: DC

## 2017-12-23 MED ORDER — CYANOCOBALAMIN 1000 MCG/ML IJ SOLN
1000.0000 ug | Freq: Once | INTRAMUSCULAR | Status: AC
  Administered 2017-12-23: 1000 ug via INTRAMUSCULAR

## 2017-12-23 NOTE — Progress Notes (Signed)
Pt seen by Dr. Walden Field today who is starting patient on Alimta for new dx lung CA. Order received to give pt B12 injection today. Rx also sent to pts pharmacy for Folic Acid to be started today as well. Tammy Sours tolerated B12 injection without incident or complaint. Pt aware that folic acid has been sent to her pharmacy and instructed on directions for use. Verbalizes understanding. Pt being scheduled for portacath insertion. Discharged self ambulatory in satisfactory condition in presence of family member.

## 2017-12-23 NOTE — Patient Instructions (Signed)
[facility] [place] saw Dr. Zoila Shutter today _______________________________________________________________  Thank you for choosing [facility] to provide your oncology and hematology care.  To afford each patient quality time with our providers, please arrive at least 15 minutes before your scheduled appointment.  You need to re-schedule your appointment if you arrive 10 or more minutes late.  We strive to give you quality time with our providers, and arriving late affects you and other patients whose appointments are after yours.  Also, if you no show three or more times for appointments you may be dismissed from the clinic.  Again, thank you for choosing [facility] [place] hope is that these requests will allow you access to exceptional care and in a timely manner. _______________________________________________________________  If you have questions after your visit, please contact our office at [phone] between the hours of 8:30 a.m. and 5:00 p.m. Voicemails left after 4:30 p.m. will not be returned until the following business day. _______________________________________________________________  For prescription refill requests, have your pharmacy contact our office. _______________________________________________________________  Recommendations made by the consultant and any test results will be sent to your referring physician. _______________________________________________________________

## 2017-12-23 NOTE — Progress Notes (Signed)
Diagnosis Malignant neoplasm of overlapping sites of lung, unspecified laterality [facility])  Staging Cancer Staging No matching staging information was found for the patient.  Assessment and Plan:  1.  Stage IV NSCLC.  Pt had CTA of the chest on 11/16/2017 with findings of  IMPRESSION: 1. 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 There are multiple adjacent nodules within the left upper lobe as well as scattered nodules throughout the lungs bilaterally concerning for metastatic disease. Left hilar adenopathy concerning for metastatic disease. 2. Indeterminate left adrenal nodule. 3. No evidence for acute pulmonary embolus. 4. Aortic Atherosclerosis (ICD10-I70.0) and Emphysema (ICD10-J43.9).  She reports weight loss of 10 pounds over past month.    Pet scan done 12/01/2017 reviewed and showed   IMPRESSION: 1. Hypermetabolic LEFT hilar mass which encroaches upon the AP window. 2. Nodularity in the anterior LEFT upper lobe and superior LEFT upper lobe consistent with primary or metastatic lung cancer. 3. Hypermetabolic metastatic pulmonary nodule within the RIGHT upper lobe. 4. 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 5. 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 6. No evidence of metastatic lung carcinoma outside the thorax.  MRI of brain done 12/01/2017 reviewed and showed  IMPRESSION: 1. 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 2. No other enhancing lesions to suggest metastatic disease. 3. No acute intracranial abnormality. 4. Mild chronic small vessel ischemic disease. 5. Chronic cerebellar infarcts.  Pt had biopsy done of LUL on 12/17/2017 with pathology returning as adenocarcinoma.  EBUS of level 11 node  returned as NSCLC.    Long talk held with pt today regarding her diagnosis of advanced NSCLC.  I have discussed with her a standard recommendation based on adenocarcinoma histology of Pembro dosed at 200 mg IV with Carboplatin AUC 5 with Alimta 500 mg/m2 every 3 weeks based on results of [facility] study that showed an increase in PFS and overall survival with the addition of Keytruda to chemotherapy in lung cancer.  Lung biomarkers requested.  Pt will be treated with 4 cycles of therapy with repeat imaging.  Continuation of  Alimta and Pembro for maintenance therapy pending response.  Side effects of the medication were reviewed and the pt was provided written information regarding treatment regimen.  NCCN guideline information was also provided.  Pt will be set up for chemotherapy teaching.  Pt will receive B12 1000 mcg today in clinic.  She is advised to begin folic acid 1 mg po daily.  She is also set up for [facility] placement to facilitate chemotherapy.  Pt will be seen for follow-up after C1.  All questions answered and pt expressed understanding of information presented.    Pt has been seen by RT for evaluation based on the brain lesion noted on MRI and due to PET findings.  Based on RT review, the patient has findings consistent with multiple pulmonary metastases.  She also had a 0.6 cm extra-axial mass found on brain MRI scan.  This finding appears to represent a meningioma.  After reviewing this study, Dr. Lisbeth Renshaw is not concerned that this represents a metastasis but feels it is reasonable to obtain a repeat MRI scan in about 8 weeks for follow-up.   If this remains stable as expected, then further brain imaging could be spaced out further.    2.  Brain lesion.  MRI of brain done due to advanced findings suspicious for malignancy on CTA done 12/01/2017 and was reviewed and showed impression:   1. 6 mm posterior right frontal extra-axial mass most likelyreflecting an incidental meningioma.  Pt has been  seen by RT for evaluation based on the brain lesion noted on MRI and due to PET findings.  Based on RT revew, the patient has findings consistent with multiple pulmonary metastases.  She also had a 0.6 cm extra-axial mass found on brain MRI scan.  This finding appears to represent a meningioma.  After reviewing this study, Dr. Lisbeth Renshaw is not concerned that this represents a metastasis but feels it is reasonable to obtain a repeat MRI scan in about 8 weeks for follow-up.   If this remains stable as expected, then further brain imaging could be spaced out further.     3.  HTN.  BP is 164/62.  Follow-up with PCP.    4.  Chest wall discomfort.  Pt reports this is improved.  Pulse ox is 98% on room air.  Pt is not in respiratory distress.  CTA negative for PE.  Likely due to lung findings on CTA.  Rx for Vicodin previously prescribed.    25 minutes spent with review of records, counseling and coordination of care.    Interval History:  Historical data obtained from note dated 11/18/2017:  77 yr old female presented to ED due to shortness of breath.  She has a long smoking history.  She underwent CTA of the chest on 11/16/2017 with findings of  IMPRESSION: 1. 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 There are multiple adjacent nodules within the left upper lobe as well as scattered nodules throughout the lungs bilaterally concerning for metastatic disease. Left hilar adenopathy concerning for metastatic disease. 2. Indeterminate left adrenal nodule. 3. No evidence for acute pulmonary embolus. 4. Aortic Atherosclerosis (ICD10-I70.0) and Emphysema (ICD10-J43.9).  Current Status:  Pt is seen today for follow-up.  She is here to go over biopsy results.      Lung cancer ([place])   12/23/2017 Initial Diagnosis    Lung cancer ([place])    12/23/2017 -  Chemotherapy    The patient had palonosetron (ALOXI) injection 0.25 mg, 0.25 mg, Intravenous,  Once, 0 of 4 cycles PEMEtrexed  (ALIMTA) 875 mg in sodium chloride 0.9 % 100 mL chemo infusion, 500 mg/m2, Intravenous,  Once, 0 of 6 cycles CARBOplatin (PARAPLATIN) 360 mg in sodium chloride 0.9 % 100 mL chemo infusion, 360 mg (original dose ), Intravenous,  Once, 0 of 4 cycles Dose modification:   (Cycle 1) pembrolizumab (KEYTRUDA) 200 mg in sodium chloride 0.9 % 50 mL chemo infusion, 200 mg, Intravenous, Once, 0 of 6 cycles fosaprepitant (EMEND) 150 mg, dexamethasone (DECADRON) 12 mg in sodium chloride 0.9 % 145 mL IVPB, , Intravenous,  Once, 0 of 4 cycles  for chemotherapy treatment.       Problem List Patient Active Problem List   Diagnosis Date Noted  . Lung cancer ([place]) [C34.90] 12/23/2017  . Special screening for malignant neoplasms, colon [Z12.11]     Past Medical History Past Medical History:  Diagnosis Date  . Anemia   . Borderline diabetes   . Dyspnea   . Hyperlipidemia   . Hypertension   . Lung cancer ([place]) 12/23/2017  . Pneumonia   . Pre-diabetes     Past Surgical History Past Surgical History:  Procedure Laterality Date  . ABDOMINAL HYSTERECTOMY    .  APPENDECTOMY    . COLONOSCOPY N/A 07/16/2017   Procedure: COLONOSCOPY;  Surgeon: Danie Binder, MD;  [facility];  Service: Endoscopy;  Laterality: N/A;  10:30  . POLYPECTOMY  07/16/2017   Procedure: POLYPECTOMY;  Surgeon: Danie Binder, MD;  Location: [facility];  Service: Endoscopy;;  cecal x2;hepatic flexurex5; splenic x1;descending x2  . VIDEO BRONCHOSCOPY WITH ENDOBRONCHIAL ULTRASOUND N/A 12/17/2017   Procedure: VIDEO BRONCHOSCOPY WITH ENDOBRONCHIAL ULTRASOUND;  Surgeon: Melrose Nakayama, MD;  Location: [facility] OR;  Service: Thoracic;  Laterality: N/A;    Family History Family History  Problem Relation Age of Onset  . Hypertension Mother   . Hypertension Father   . Colon cancer Neg Hx   . Colon polyps Neg Hx      Social History  reports that she quit smoking about 5 years ago. Her smoking use included cigarettes.  She has a 40.00 pack-year smoking history. She has never used smokeless tobacco. She reports that she does not drink alcohol or use drugs.  Medications  Current Outpatient Medications:  .  albuterol (PROVENTIL HFA;VENTOLIN HFA) 108 (90 Base) MCG/ACT inhaler, Inhale 2 puffs into the lungs every 4 (four) hours as needed for wheezing or shortness of breath (or coughing)., Disp: 1 Inhaler, Rfl: 0 .  amLODipine (NORVASC) 5 MG tablet, Take 5 mg by mouth every evening. , Disp: , Rfl:  .  aspirin EC 81 MG tablet, Take 81 mg by mouth every evening. , Disp: , Rfl:  .  benzonatate (TESSALON) 100 MG capsule, Take 1 capsule (100 mg total) by mouth 3 (three) times daily as needed for cough., Disp: 15 capsule, Rfl: 0 .  HYDROcodone-acetaminophen (NORCO/VICODIN) 5-325 MG tablet, 1 or 2 tabs PO q12 hours prn pain (Patient taking differently: Take 1-2 tablets by mouth every 12 (twelve) hours as needed (pain.). ), Disp: 60 tablet, Rfl: 0 .  lisinopril (PRINIVIL,ZESTRIL) 5 MG tablet, Take 5 mg by mouth every evening. , Disp: , Rfl:  .  simvastatin (ZOCOR) 40 MG tablet, Take 40 mg by mouth every evening. , Disp: , Rfl:  .  folic acid (FOLVITE) 1 MG tablet, Take 1 tablet (1 mg total) by mouth daily. Start 5-7 days before Alimta chemotherapy. Continue until 21 days after Alimta completed., Disp: 100 tablet, Rfl: 3  Allergies Codeine  Review of Systems Review of Systems - Oncology ROS negative   Physical Exam  Vitals Wt Readings from Last 3 Encounters:  12/23/17 135 lb 3.2 oz (61.3 kg)  12/17/17 140 lb 3.2 oz (63.6 kg)  12/16/17 140 lb 3.2 oz (63.6 kg)   Temp Readings from Last 3 Encounters:  12/23/17 98.2 F (36.8 C) (Oral)  12/17/17 97.7 F (36.5 C)  12/16/17 98.3 F (36.8 C) (Oral)   BP Readings from Last 3 Encounters:  12/23/17 (!) 164/62  12/17/17 (!) 172/62  12/16/17 (!) 188/67   Pulse Readings from Last 3 Encounters:  12/23/17 67  12/17/17 64  12/16/17 66   Constitutional:  Well-developed, well-nourished, and in no distress.   HENT: Head: Normocephalic and atraumatic.  Mouth/Throat: No oropharyngeal exudate. Mucosa moist. Eyes: Pupils are equal, round, and reactive to light. Conjunctivae are normal. No scleral icterus.  Neck: Normal range of motion. Neck supple. No JVD present.  Cardiovascular: Normal rate, regular rhythm and normal heart sounds.  Exam reveals no gallop and no friction rub.   No murmur heard. Pulmonary/Chest: Effort normal and breath sounds normal. No respiratory distress. No wheezes.No rales.  Abdominal:  Soft. Bowel sounds are normal. No distension. There is no tenderness. There is no guarding.  Musculoskeletal: No edema or tenderness.  Lymphadenopathy: No cervical, axillary or supraclavicular adenopathy.  Neurological: Alert and oriented to person, place, and time. No cranial nerve deficit.  Skin: Skin is warm and dry. No rash noted. No erythema. No pallor.  Psychiatric: Affect and judgment normal.   Labs No visits with results within 3 Day(s) from this visit.  Latest known visit with results is:  Admission on 12/17/2017, Discharged on 12/17/2017  Component Date Value Ref Range Status  . aPTT 12/17/2017 31  24 - 36 seconds Final   Performed at [facility], [place] [address] [place], [place] [address]  . WBC 12/17/2017 6.5  4.0 - 10.5 K/uL Final  . RBC 12/17/2017 5.30* 3.87 - 5.11 MIL/uL Final  . Hemoglobin 12/17/2017 13.2  12.0 - 15.0 g/dL Final  . HCT 12/17/2017 46.0  36.0 - 46.0 % Final  . MCV 12/17/2017 86.8  80.0 - 100.0 fL Final  . MCH 12/17/2017 24.9* 26.0 - 34.0 pg Final  . MCHC 12/17/2017 28.7* 30.0 - 36.0 g/dL Final  . RDW 12/17/2017 12.8  11.5 - 15.5 % Final  . Platelets 12/17/2017 394  150 - 400 K/uL Final  . nRBC 12/17/2017 0.0  0.0 - 0.2 % Final   Performed at [facility], [place] [address], [place], [place] [address]  . Sodium 12/17/2017 137  135 - 145 mmol/L Final  . Potassium 12/17/2017 3.6  3.5 - 5.1 mmol/L  Final  . Chloride 12/17/2017 103  98 - 111 mmol/L Final  . CO2 12/17/2017 27  22 - 32 mmol/L Final  . Glucose, Bld 12/17/2017 106* 70 - 99 mg/dL Final  . BUN 12/17/2017 7* 8 - 23 mg/dL Final  . Creatinine, Ser 12/17/2017 0.68  0.44 - 1.00 mg/dL Final  . Calcium 12/17/2017 9.5  8.9 - 10.3 mg/dL Final  . Total Protein 12/17/2017 7.6  6.5 - 8.1 g/dL Final  . Albumin 12/17/2017 3.6  3.5 - 5.0 g/dL Final  . AST 12/17/2017 18  15 - 41 U/L Final  . ALT 12/17/2017 9  0 - 44 U/L Final  . Alkaline Phosphatase 12/17/2017 53  38 - 126 U/L Final  . Total Bilirubin 12/17/2017 0.7  0.3 - 1.2 mg/dL Final  . GFR calc non Af Amer 12/17/2017 >60  >60 mL/min Final  . GFR calc Af Amer 12/17/2017 >60  >60 mL/min Final   Comment: (NOTE) The eGFR has been calculated using the CKD EPI equation. This calculation has not been validated in all clinical situations. eGFR's persistently <60 mL/min signify possible Chronic Kidney Disease.   Georgiann Hahn gap 12/17/2017 7  5 - 15 Final   Performed at [facility], [place] [address], [place], [place] [address]  . Prothrombin Time 12/17/2017 13.6  11.4 - 15.2 seconds Final  . INR 12/17/2017 1.05   Final   Performed at [facility], [place] [address] [place], [place] [address]  . Glucose-Capillary 12/17/2017 99  70 - 99 mg/dL Final     Pathology No orders of the defined types were placed in this encounter.      Zoila Shutter MD

## 2017-12-24 ENCOUNTER — Other Ambulatory Visit: Payer: Self-pay

## 2017-12-24 ENCOUNTER — Ambulatory Visit (HOSPITAL_COMMUNITY)
Admission: RE | Admit: 2017-12-24 | Discharge: 2017-12-24 | Disposition: A | Discharge status: Home / Self Care | Payer: Medicare HMO | Source: Ambulatory Visit | Attending: Internal Medicine | Admitting: Internal Medicine

## 2017-12-24 ENCOUNTER — Encounter (HOSPITAL_COMMUNITY): Payer: Self-pay

## 2017-12-24 ENCOUNTER — Encounter (HOSPITAL_COMMUNITY): Payer: Self-pay | Admitting: *Deleted

## 2017-12-24 DIAGNOSIS — Z87891 Personal history of nicotine dependence: Secondary | ICD-10-CM | POA: Insufficient documentation

## 2017-12-24 DIAGNOSIS — E785 Hyperlipidemia, unspecified: Secondary | ICD-10-CM | POA: Insufficient documentation

## 2017-12-24 DIAGNOSIS — Z885 Allergy status to narcotic agent status: Secondary | ICD-10-CM | POA: Diagnosis not present

## 2017-12-24 DIAGNOSIS — Z7982 Long term (current) use of aspirin: Secondary | ICD-10-CM | POA: Insufficient documentation

## 2017-12-24 DIAGNOSIS — Z8249 Family history of ischemic heart disease and other diseases of the circulatory system: Secondary | ICD-10-CM | POA: Diagnosis not present

## 2017-12-24 DIAGNOSIS — E041 Nontoxic single thyroid nodule: Secondary | ICD-10-CM | POA: Insufficient documentation

## 2017-12-24 DIAGNOSIS — Z90710 Acquired absence of both cervix and uterus: Secondary | ICD-10-CM | POA: Diagnosis not present

## 2017-12-24 DIAGNOSIS — Z79899 Other long term (current) drug therapy: Secondary | ICD-10-CM | POA: Insufficient documentation

## 2017-12-24 DIAGNOSIS — Z9889 Other specified postprocedural states: Secondary | ICD-10-CM | POA: Diagnosis not present

## 2017-12-24 DIAGNOSIS — R634 Abnormal weight loss: Secondary | ICD-10-CM | POA: Diagnosis not present

## 2017-12-24 DIAGNOSIS — I10 Essential (primary) hypertension: Secondary | ICD-10-CM | POA: Insufficient documentation

## 2017-12-24 DIAGNOSIS — R7303 Prediabetes: Secondary | ICD-10-CM | POA: Diagnosis not present

## 2017-12-24 DIAGNOSIS — C3490 Malignant neoplasm of unspecified part of unspecified bronchus or lung: Secondary | ICD-10-CM | POA: Insufficient documentation

## 2017-12-24 DIAGNOSIS — Z452 Encounter for adjustment and management of vascular access device: Secondary | ICD-10-CM | POA: Diagnosis not present

## 2017-12-24 DIAGNOSIS — R911 Solitary pulmonary nodule: Secondary | ICD-10-CM

## 2017-12-24 HISTORY — PX: IR IMAGING GUIDED PORT INSERTION: IMG5740

## 2017-12-24 LAB — PROTIME-INR
INR: 1.04
Prothrombin Time: 13.5 seconds (ref 11.4–15.2)

## 2017-12-24 LAB — CBC
HCT: 42.8 % (ref 36.0–46.0)
Hemoglobin: 12.6 g/dL (ref 12.0–15.0)
MCH: 24.9 pg — AB (ref 26.0–34.0)
MCHC: 29.4 g/dL — AB (ref 30.0–36.0)
MCV: 84.4 fL (ref 80.0–100.0)
PLATELETS: 361 10*3/uL (ref 150–400)
RBC: 5.07 MIL/uL (ref 3.87–5.11)
RDW: 12.8 % (ref 11.5–15.5)
WBC: 6.2 10*3/uL (ref 4.0–10.5)
nRBC: 0 % (ref 0.0–0.2)

## 2017-12-24 MED ORDER — LIDOCAINE HCL 1 % IJ SOLN
INTRAMUSCULAR | Status: AC
  Filled 2017-12-24: qty 20

## 2017-12-24 MED ORDER — CEFAZOLIN SODIUM-DEXTROSE 2-4 GM/100ML-% IV SOLN
2.0000 g | Freq: Once | INTRAVENOUS | Status: AC
  Administered 2017-12-24: 2 g via INTRAVENOUS

## 2017-12-24 MED ORDER — FENTANYL CITRATE (PF) 100 MCG/2ML IJ SOLN
INTRAMUSCULAR | Status: AC | PRN
  Administered 2017-12-24 (×2): 25 ug via INTRAVENOUS

## 2017-12-24 MED ORDER — CEFAZOLIN SODIUM-DEXTROSE 2-4 GM/100ML-% IV SOLN
INTRAVENOUS | Status: AC
  Filled 2017-12-24: qty 100

## 2017-12-24 MED ORDER — MIDAZOLAM HCL 2 MG/2ML IJ SOLN
INTRAMUSCULAR | Status: AC
  Filled 2017-12-24: qty 2

## 2017-12-24 MED ORDER — FENTANYL CITRATE (PF) 100 MCG/2ML IJ SOLN
INTRAMUSCULAR | Status: AC
  Filled 2017-12-24: qty 2

## 2017-12-24 MED ORDER — MIDAZOLAM HCL 2 MG/2ML IJ SOLN
INTRAMUSCULAR | Status: AC | PRN
  Administered 2017-12-24: 1 mg via INTRAVENOUS

## 2017-12-24 MED ORDER — HEPARIN SOD (PORK) LOCK FLUSH 100 UNIT/ML IV SOLN
INTRAVENOUS | Status: AC
  Filled 2017-12-24: qty 5

## 2017-12-24 MED ORDER — LIDOCAINE-EPINEPHRINE (PF) 1 %-1:200000 IJ SOLN
INTRAMUSCULAR | Status: AC
  Filled 2017-12-24: qty 30

## 2017-12-24 NOTE — H&P (Signed)
Chief Complaint: Patient was seen in consultation today for [facility] a cath placement at the request of Higgs,Vetta  Referring Physician(s): Higgs,Vetta  Supervising Physician: Markus Daft  Patient Status: [facility] - Out-pt  History of Present Illness: Melinda Larsen is a 77 y.o. female   Stage IV lung Ca 12/17/17: Lung, biopsy, left upper lobe - ADENOCARCINOMA.  Wt loss; smoker  Starting chemo next month Scheduled for PAC placement in IR   Past Medical History:  Diagnosis Date  . Anemia   . Borderline diabetes   . Dyspnea   . Hyperlipidemia   . Hypertension   . Lung cancer ([place]) 12/23/2017  . Pneumonia   . Pre-diabetes     Past Surgical History:  Procedure Laterality Date  . ABDOMINAL HYSTERECTOMY    . APPENDECTOMY    . COLONOSCOPY N/A 07/16/2017   Procedure: COLONOSCOPY;  Surgeon: Danie Binder, MD;  Location: [facility] ENDO SUITE;  Service: Endoscopy;  Laterality: N/A;  10:30  . POLYPECTOMY  07/16/2017   Procedure: POLYPECTOMY;  Surgeon: Danie Binder, MD;  Location: [facility] ENDO SUITE;  Service: Endoscopy;;  cecal x2;hepatic flexurex5; splenic x1;descending x2  . VIDEO BRONCHOSCOPY WITH ENDOBRONCHIAL ULTRASOUND N/A 12/17/2017   Procedure: VIDEO BRONCHOSCOPY WITH ENDOBRONCHIAL ULTRASOUND;  Surgeon: Melrose Nakayama, MD;  Location: [facility] OR;  Service: Thoracic;  Laterality: N/A;    Allergies: Codeine  Medications: Prior to Admission medications   Medication Sig Start Date End Date Taking? Authorizing Provider  albuterol (PROVENTIL HFA;VENTOLIN HFA) 108 (90 Base) MCG/ACT inhaler Inhale 2 puffs into the lungs every 4 (four) hours as needed for wheezing or shortness of breath (or coughing). 11/16/17   Francine Graven, DO  amLODipine (NORVASC) 5 MG tablet Take 5 mg by mouth every evening.     [provider]  aspirin EC 81 MG tablet Take 81 mg by mouth every evening.     [provider]  benzonatate (TESSALON) 100 MG capsule Take 1 capsule (100 mg  total) by mouth 3 (three) times daily as needed for cough. 11/16/17   Francine Graven, DO  folic acid (FOLVITE) 1 MG tablet Take 1 tablet (1 mg total) by mouth daily. Start 5-7 days before Alimta chemotherapy. Continue until 21 days after Alimta completed. 12/23/17   Higgs, Mathis Dad, MD  HYDROcodone-acetaminophen (NORCO/VICODIN) 5-325 MG tablet 1 or 2 tabs PO q12 hours prn pain Patient taking differently: Take 1-2 tablets by mouth every 12 (twelve) hours as needed (pain.).  12/05/17   Higgs, Mathis Dad, MD  lisinopril (PRINIVIL,ZESTRIL) 5 MG tablet Take 5 mg by mouth every evening.     [provider]  simvastatin (ZOCOR) 40 MG tablet Take 40 mg by mouth every evening.     [provider]     Family History  Problem Relation Age of Onset  . Hypertension Mother   . Hypertension Father   . Colon cancer Neg Hx   . Colon polyps Neg Hx     Social History   Socioeconomic History  . Marital status: Married    Spouse name: Not on file  . Number of children: Not on file  . Years of education: Not on file  . Highest education level: Not on file  Occupational History  . Not on file  Social Needs  . Financial resource strain: Not on file  . Food insecurity:    Worry: Not on file    Inability: Not on file  . Transportation needs:    Medical: No  Non-medical: No  Tobacco Use  . Smoking status: Former Smoker    Packs/day: 1.00    Years: 40.00    Pack years: 40.00    Types: Cigarettes    Last attempt to quit: 12/09/2012    Years since quitting: 5.0  . Smokeless tobacco: Never Used  . Tobacco comment: off and on since her teens  Substance and Sexual Activity  . Alcohol use: No  . Drug use: No  . Sexual activity: Not on file  Lifestyle  . Physical activity:    Days per week: Not on file    Minutes per session: Not on file  . Stress: Not on file  Relationships  . Social connections:    Talks on phone: Not on file    Gets together: Not on file    Attends religious  service: Not on file    Active member of club or organization: Not on file    Attends meetings of clubs or organizations: Not on file    Relationship status: Not on file  Other Topics Concern  . Not on file  Social History Narrative  . Not on file     Review of Systems: A 12 point ROS discussed and pertinent positives are indicated in the HPI above.  All other systems are negative.  Review of Systems  Constitutional: Positive for unexpected weight change. Negative for activity change.  Respiratory: Positive for cough and shortness of breath.   Cardiovascular: Negative for chest pain.  Neurological: Positive for weakness.  Psychiatric/Behavioral: Negative for behavioral problems and confusion.    Vital Signs: BP (!) 186/75   Pulse 65   Temp 98.9 F (37.2 C)   Resp 20   Ht 5\' 10"  (1.778 m)   Wt 141 lb (64 kg)   SpO2 100%   BMI 20.23 kg/m   Physical Exam  Constitutional: She is oriented to person, place, and time.  Cardiovascular: Normal rate and regular rhythm.  Pulmonary/Chest: Effort normal and breath sounds normal.  Abdominal: Soft. Bowel sounds are normal.  Musculoskeletal: Normal range of motion.  Neurological: She is alert and oriented to person, place, and time.  Skin: Skin is warm and dry.  Psychiatric: She has a normal mood and affect. Her behavior is normal. Judgment and thought content normal.  Vitals reviewed.   Imaging: Dg Chest 2 View  Result Date: 12/17/2017 CLINICAL DATA:  77 year old female preoperative study for bronchoscopy. Left suprahilar lung mass. EXAM: CHEST - 2 VIEW COMPARISON:  Chest CT 11/16/2017 and earlier. FINDINGS: Mild radiographic enlargement of the left suprahilar masslike opacity since 11/16/2017. Other mediastinal contours remain stable. Large lung volumes. No pneumothorax, pulmonary edema, pleural effusion or new pulmonary opacity. Visualized tracheal air column is within normal limits. No acute osseous abnormality identified.  Negative visible bowel gas pattern. IMPRESSION: 1. Mild radiographic enlargement of the left suprahilar mass since 11/16/2017. 2. No new cardiopulmonary abnormality. Electronically Signed   By: Genevie Ann M.D.   On: 12/17/2017 10:56   Mr Jeri Cos GY Contrast  Result Date: 12/01/2017 CLINICAL DATA:  Headaches for 3 months.  Left suprahilar lung mass. EXAM: MRI HEAD WITHOUT AND WITH CONTRAST TECHNIQUE: Multiplanar, multiecho pulse sequences of the brain and surrounding structures were obtained without and with intravenous contrast. CONTRAST:  6 mL Gadavist COMPARISON:  None. FINDINGS: Brain: There is no evidence of acute infarct, intracranial hemorrhage, midline shift, or extra-axial fluid collection. The ventricles and sulci are within normal limits for age. Small foci of T2  hyperintensity in the cerebral white matter most notable in the left parietal lobe and are nonspecific but compatible with mild chronic small vessel ischemic disease. There are small chronic infarcts in the left cerebellum. A 6 mm homogeneously enhancing extra-axial mass is noted over the right cerebral convexity in the posterior frontal region without associated mass effect or edema (series 13, image 71). No abnormal enhancement is identified elsewhere. Vascular: Major intracranial vascular flow voids are preserved. Skull and upper cervical spine: No suspicious marrow lesion. Sinuses/Orbits: Left cataract extraction. Small maxillary sinuses. No evidence of acute sinusitis or mastoid fluid. Other: None. IMPRESSION: 1. 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 2. No other enhancing lesions to suggest metastatic disease. 3. No acute intracranial abnormality. 4. Mild chronic small vessel ischemic disease. 5. Chronic cerebellar infarcts. Electronically Signed   By: Logan Bores M.D.   On: 12/01/2017 17:09   Nm Pet Image Initial (pi) Skull Base To Thigh  Result Date: 12/02/2017 CLINICAL DATA:   Initial treatment strategy for lung mass. EXAM: NUCLEAR MEDICINE PET SKULL BASE TO THIGH TECHNIQUE: 7.7 mCi F-18 FDG was injected intravenously. Full-ring PET imaging was performed from the skull base to thigh after the radiotracer. CT data was obtained and used for attenuation correction and anatomic localization. Fasting blood glucose: 93 mg/dl COMPARISON:  Chest CT 11/16/2017 FINDINGS: Mediastinal blood pool activity: SUV max 2.1 NECK: Asymmetric activity in the larynx with the RIGHT vocal cord hypermetabolic compared to the LEFT. This presumably reflects encroachment upon the LEFT recurrent laryngeal nerve by the LEFT hilar tumor. No hypermetabolic lymph nodes in the neck Incidental CT findings: none CHEST: LEFT hilar mass measuring 4.3 x 4.3 cm with SUV max equal 10.6. Mass extends into the AP window likely involves the LEFT recurrent laryngeal nerve as above. There are several satellite nodules superior to the LEFT hilar mass. Example 9 mm LEFT upper lobe nodule with SUV max equal 4.9 on image 79 of fused data set. Additionally there is a cluster of nodules in the anterior LEFT upper lobe measuring 3.5 cm in conglomerate with SUV max equal 10.0. Within the RIGHT lung there is a hypermetabolic nodule peripherally in the upper lobe measuring 6 mm with SUV max equal 4.1. No hypermetabolic axillary lymph nodes no hypermetabolic supraclavicular nodes. Incidental finding of a hypermetabolic nodule within the RIGHT lobe of thyroid gland with SUV max equal 4.9. This nodule measures 10 mm on the CT portion Incidental CT findings: none ABDOMEN/PELVIS: No abnormal hypermetabolic activity within the liver, pancreas, adrenal glands, or spleen. No hypermetabolic lymph nodes in the abdomen or pelvis. Incidental CT findings: none SKELETON: No focal hypermetabolic activity to suggest skeletal metastasis. Incidental CT findings: none IMPRESSION: 1. Hypermetabolic LEFT hilar mass which encroaches upon the AP window. 2.  Nodularity in the anterior LEFT upper lobe and superior LEFT upper lobe consistent with primary or metastatic lung cancer. 3. Hypermetabolic metastatic pulmonary nodule within the RIGHT upper lobe. 4. 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 5. 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 6. No evidence of metastatic lung carcinoma outside the thorax. Electronically Signed   By: Suzy Bouchard M.D.   On: 12/02/2017 10:00    Labs:  CBC: Recent Labs    11/16/17 1230 12/17/17 1007  WBC 4.6 6.5  HGB 13.5 13.2  HCT 44.1 46.0  PLT 301 394    COAGS: Recent Labs    12/17/17 1007  INR 1.05  APTT 31    BMP: Recent Labs    11/16/17 1230 12/17/17 1007  NA 137 137  K 3.6 3.6  CL 104 103  CO2 26 27  GLUCOSE 101* 106*  BUN 11 7*  CALCIUM 9.4 9.5  CREATININE 0.60 0.68  GFRNONAA >60 >60  GFRAA >60 >60    LIVER FUNCTION TESTS: Recent Labs    11/16/17 1230 12/17/17 1007  BILITOT 1.0 0.7  AST 19 18  ALT 10 9  ALKPHOS 51 53  PROT 8.2* 7.6  ALBUMIN 4.2 3.6    TUMOR MARKERS: No results for input(s): AFPTM, CEA, CA199, CHROMGRNA in the last 8760 hours.  Assessment and Plan:  NSCLC IV Port a cath placement today in IR Risks and benefits of image guided port-a-catheter placement was discussed with the patient including, but not limited to bleeding, infection, pneumothorax, or fibrin sheath development and need for additional procedures.  All of the patient's questions were answered, patient is agreeable to proceed. Consent signed and in chart.   Thank you for this interesting consult.  I greatly enjoyed meeting NEVIAH BRAUD and look forward to participating in their care.  A copy of this report was sent to the requesting provider on this date.  Electronically Signed: Lavonia Drafts,  PA-C 12/24/2017, 10:24 AM   I spent a total of  30 Minutes   in face to face in clinical consultation, greater than 50% of which was counseling/coordinating care for [facility]

## 2017-12-24 NOTE — Discharge Instructions (Addendum)
Implanted Port Insertion, Care After °This sheet gives you information about how to care for yourself after your procedure. Your health care provider may also give you more specific instructions. If you have problems or questions, contact your health care provider. °What can I expect after the procedure? °After your procedure, it is common to have: °· Discomfort at the port insertion site. °· Bruising on the skin over the port. This should improve over 3-4 days. ° °Follow these instructions at home: °Port care °· After your port is placed, you will get a manufacturer's information card. The card has information about your port. Keep this card with you at all times. °· Take care of the port as told by your health care provider. Ask your health care provider if you or a family member can get training for taking care of the port at home. A home health care nurse may also take care of the port. °· Make sure to remember what type of port you have. °Incision care °· Follow instructions from your health care provider about how to take care of your port insertion site. Make sure you: °? Wash your hands with soap and water before you change your bandage (dressing). If soap and water are not available, use hand sanitizer. °? Change your dressing as told by your health care provider. °? Leave stitches (sutures), skin glue, or adhesive strips in place. These skin closures may need to stay in place for 2 weeks or longer. If adhesive strip edges start to loosen and curl up, you may trim the loose edges. Do not remove adhesive strips completely unless your health care provider tells you to do that. °· Check your port insertion site every day for signs of infection. Check for: °? More redness, swelling, or pain. °? More fluid or blood. °? Warmth. °? Pus or a bad smell. °General instructions °· Do not take baths, swim, or use a hot tub until your health care provider approves. °· Do not lift anything that is heavier than 10 lb (4.5  kg) for a week, or as told by your health care provider. °· Ask your health care provider when it is okay to: °? Return to work or school. °? Resume usual physical activities or sports. °· Do not drive for 24 hours if you were given a medicine to help you relax (sedative). °· Take over-the-counter and prescription medicines only as told by your health care provider. °· Wear a medical alert bracelet in case of an emergency. This will tell any health care providers that you have a port. °· Keep all follow-up visits as told by your health care provider. This is important. °Contact a health care provider if: °· You cannot flush your port with saline as directed, or you cannot draw blood from the port. °· You have a fever or chills. °· You have more redness, swelling, or pain around your port insertion site. °· You have more fluid or blood coming from your port insertion site. °· Your port insertion site feels warm to the touch. °· You have pus or a bad smell coming from the port insertion site. °Get help right away if: °· You have chest pain or shortness of breath. °· You have bleeding from your port that you cannot control. °Summary °· Take care of the port as told by your health care provider. °· Change your dressing as told by your health care provider. °· Keep all follow-up visits as told by your health care provider. °  This information is not intended to replace advice given to you by your health care provider. Make sure you discuss any questions you have with your health care provider. °Document Released: 11/11/2012 Document Revised: 12/13/2015 Document Reviewed: 12/13/2015 °Elsevier Interactive Patient Education © 2017 [employer] °Moderate Conscious Sedation, Adult, Care After °These instructions provide you with information about caring for yourself after your procedure. Your health care provider may also give you more specific instructions. Your treatment has been planned according to current medical  practices, but problems sometimes occur. Call your health care provider if you have any problems or questions after your procedure. °What can I expect after the procedure? °After your procedure, it is common: °· To feel sleepy for several hours. °· To feel clumsy and have poor balance for several hours. °· To have poor judgment for several hours. °· To vomit if you eat too soon. ° °Follow these instructions at home: °For at least 24 hours after the procedure: ° °· Do not: °? Participate in activities where you could fall or become injured. °? Drive. °? Use heavy machinery. °? Drink alcohol. °? Take sleeping pills or medicines that cause drowsiness. °? Make important decisions or sign legal documents. °? Take care of children on your own. °· Rest. °Eating and drinking °· Follow the diet recommended by your health care provider. °· If you vomit: °? Drink water, juice, or soup when you can drink without vomiting. °? Make sure you have little or no nausea before eating solid foods. °General instructions °· Have a responsible adult stay with you until you are awake and alert. °· Take over-the-counter and prescription medicines only as told by your health care provider. °· If you smoke, do not smoke without supervision. °· Keep all follow-up visits as told by your health care provider. This is important. °Contact a health care provider if: °· You keep feeling nauseous or you keep vomiting. °· You feel light-headed. °· You develop a rash. °· You have a fever. °Get help right away if: °· You have trouble breathing. °This information is not intended to replace advice given to you by your health care provider. Make sure you discuss any questions you have with your health care provider. °Document Released: 11/11/2012 Document Revised: 06/26/2015 Document Reviewed: 05/13/2015 °Elsevier Interactive Patient Education © 2018 [employer] ° °

## 2017-12-24 NOTE — Procedures (Signed)
Placement of right jugular port.  Tip at SVC/RA junction.  Minimal blood loss and no immediate complication.

## 2017-12-24 NOTE — Progress Notes (Signed)
I spoke with Melinda Larsen in pathology and ordered foundation one and PDL-1 on [id number]. Dx: C34.90 Stage IV.

## 2017-12-25 ENCOUNTER — Other Ambulatory Visit (HOSPITAL_COMMUNITY): Payer: Self-pay | Admitting: Pharmacist

## 2017-12-25 ENCOUNTER — Other Ambulatory Visit: Payer: Self-pay | Admitting: *Deleted

## 2017-12-25 ENCOUNTER — Other Ambulatory Visit (HOSPITAL_COMMUNITY): Payer: Self-pay

## 2017-12-25 DIAGNOSIS — R918 Other nonspecific abnormal finding of lung field: Secondary | ICD-10-CM

## 2017-12-25 DIAGNOSIS — C3480 Malignant neoplasm of overlapping sites of unspecified bronchus and lung: Secondary | ICD-10-CM

## 2017-12-25 MED ORDER — PROCHLORPERAZINE MALEATE 10 MG PO TABS: 10.0000 mg | ORAL_TABLET | Freq: Four times a day (QID) | ORAL | 1 refills | Status: DC | PRN

## 2017-12-25 MED ORDER — LIDOCAINE-PRILOCAINE 2.5-2.5 % EX CREA: TOPICAL_CREAM | CUTANEOUS | 3 refills | Status: DC

## 2017-12-25 MED ORDER — DEXAMETHASONE 4 MG PO TABS: ORAL_TABLET | ORAL | 1 refills | Status: DC

## 2017-12-25 NOTE — Progress Notes (Signed)
I called patient and explained the medications that I called in to the pharmacy.  She verbalizes understanding.

## 2017-12-25 NOTE — Patient Instructions (Signed)
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  We are going to be treating you with the intent of controlling your disease.  We will be giving you chemotherapy every 21 days through your port a cath.  We will be giving you Paraplatin (carboplatin), Alimta (pemetrexed) and Keytruda (pembolizumab).  You will see the doctor regularly throughout treatment.  We monitor your lab work prior to every treatment. The doctor monitors your response to treatment by the way you are feeling, your blood work, and scans periodically.  There will be wait times while you are here for treatment.  It will take about 30 minutes to 1 hour for your lab work to result.  Then there will be wait times while pharmacy mixes your medications.   You must take folic acid every day by mouth beginning 7 days before your first dose of alimta.  You must keep taking folic acid every day during the time you are being treated with alimta, and for every day for 21 days after you receive your last alimta dose.    You will get B12 injections while you are on alimta.  The first one about 1 week prior to starting treatment and then about every 9 weeks during treatment.    You will receive the following pre-medications prior to receiving chemotherapy:  Aloxi - high powered nausea/vomiting prevention medication used for chemotherapy patients.   Emend - high powered nausea/vomiting prevention medication used for chemotherapy patients. Dexamethasone - steroid - given to help reduce the incidence of chemotherapy related reactions.    Carboplatin (Generic Name) Other Names: Paraplatin, CBDCA  About This Drug Carboplatin is a drug used to treat cancer. This drug is given in the vein (IV).  Takes 30 minutes to infuse.   Possible Side Effects (More Common) . Nausea and throwing up (vomiting). These symptoms may happen within a few hours after your treatment and may last up to  24 hours. Medicines are available to stop or lessen these side effects. . Bone marrow depression. This is a decrease in the number of white blood cells, red blood cells, and platelets. This may raise your risk of infection, make you tired and weak (fatigue), and raise your risk of bleeding. . Soreness of the mouth and throat. You may have red areas, white patches, or sores that hurt. . This drug may affect how your kidneys work. Your kidney function will be checked as needed. . Electrolyte changes. Your blood will be checked for electrolyte changes as needed.  Possible Side Effects (Less Common) . Hair loss. Some patients lose their hair on the scalp and body. You may notice your hair thinning seven to 14 days after getting this drug. . Effects on the nerves are called peripheral neuropathy. You may feel numbness, tingling, or pain in your hands and feet. It may be hard for you to button your clothes, open jars, or walk as usual. The effect on the nerves may get worse with more doses of the drug. These effects get better in some people after the drug is stopped but it does not get better in all people. . Loose bowel movements (diarrhea) that may last for several days . Decreased hearing or ringing in the ears . Changes in the way food and drinks taste . Changes in liver function. Your liver function will be checked as needed  Allergic Reactions Serious allergic reactions including anaphylaxis are rare. While you are  getting this drug in your vein (IV), tell your nurse right away if you have any of these symptoms of an allergic reaction: . Trouble catching your breath . Feeling like your tongue or throat are swelling . Feeling your heart beat quickly or in a not normal way (palpitations) . Feeling dizzy or lightheaded . Flushing, itching, rash, and/or hives  Treating Side Effects . Drink 6-8 cups of fluids each day unless your doctor has told you to limit your fluid intake due to some other  health problem. A cup is 8 ounces of fluid. If you throw up or have loose bowel movements, you should drink more fluids so that you do not become dehydrated (lack water in the body from losing too much fluid). . Mouth care is very important. Your mouth care should consist of routine, gentle cleaning of your teeth or dentures and rinsing your mouth with a mixture of 1/2 teaspoon of salt in 8 ounces of water or  teaspoon of baking soda in 8 ounces of water. This should be done at least after each meal and at bedtime. . If you have mouth sores, avoid mouthwash that has alcohol. Avoid alcohol and smoking because they can bother your mouth and throat. . If you have numbness and tingling in your hands and feet, be careful when cooking, walking, and handling sharp objects and hot liquids. . Talk with your nurse about getting a wig before you lose your hair. Also, call the [employer] at 800-ACS-2345 to find out information about the "Look Good, Feel Better" program close to where you live. It is a free program where women getting chemotherapy can learn about wigs, turbans and scarves as well as makeup techniques and skin and nail care.  Food and Drug Interactions There are no known interactions of carboplatin with food. This drug may interact with other medicines. Tell your doctor and pharmacist about all the medicines and dietary supplements (vitamins, minerals, herbs and others) that you are taking at this time. The safety and use of dietary supplements and alternative diets are often not known. Using these might affect your cancer or interfere with your treatment. Until more is known, you should not use dietary supplements or alternative diets without your cancer doctor's help.  When to Call the Doctor Call your doctor or nurse right away if you have any of these symptoms: . Fever of 100.5 F (38 C) or above; chills . Bleeding or bruising that is not normal . Wheezing or trouble breathing .  Nausea that stops you from eating or drinking . Throwing up more than once a day . Rash or itching . Loose bowel movements (diarrhea) more than four times a day or diarrhea with weakness or feeling lightheaded . Call your doctor or nurse as soon as possible if any of these symptoms happen: . Numbness, tingling, decreased feeling or weakness in fingers, toes, arms, or legs . Change in hearing, ringing in the ears . Blurred vision or other changes in eyesight . Decreased urine . Yellowing of skin or eyes  Sexual Problems and Reproductive Concerns Sexual problems and reproduction concerns may happen. In both men and women, this drug may affect your ability to have children. This cannot be determined before your treatment. Talk with your doctor or nurse if you plan to have children. Ask for information on sperm or egg banking. In men, this drug may interfere with your ability to make sperm, but it should not change your ability to have  sexual relations. In women, menstrual bleeding may become irregular or stop while you are getting this drug. Do not assume that you cannot become pregnant if you do not have a menstrual period. Women may go through signs of menopause (change of life) like vaginal dryness or itching. Vaginal lubricants can be used to lessen vaginal dryness, itching, and pain during sexual relations. Genetic counseling is available for you to talk about the effects of this drug therapy on future pregnancies. Also, a genetic counselor can look at the possible risk of problems in the unborn baby due to this medicine if an exposure happens during pregnancy. . Pregnancy warning: This drug may have harmful effects on the unborn child, so effective methods of birth control should be used during your cancer treatment. . Breast feeding warning: It is not known if this drug passes into breast milk. For this reason, women should talk to their doctor about the risks and benefits of breast feeding during  treatment with this drug because this drug may enter the breast milk and badly harm a breast feeding baby.   Pemetrexed (Generic Name) Other Names: ALIMTA  About This Drug Pemetrexed is used to treat cancer. It is given in the vein (IV).  Takes 10 minutes to infuse.   Possible Side Effects (More Common) . Bone marrow depression. This is a decrease in the number of white blood cells, red blood cells, and platelets. This may raise your risk of infection, make you tired and weak (fatigue), and raise your risk of bleeding. . Fatigue . Soreness of the mouth and throat. You may have red areas, white patches, or sores that hurt.  Possible Side Effects (less common) . Trouble breathing or feeling short of breath . Nausea and throwing up (vomiting) . Skin rash  Treating Side Effects . Ask your doctor or nurse about medicine that is available to help stop or lessen nausea and throwing up. . If you get a rash, do not put anything on it unless your doctor or nurse says you may. Keep the area around the rash clean and dry. . Mouth care is very important. Your mouth care should consist of routine, gentle cleaning of your teeth or dentures and rinsing your mouth with a mixture of 1/2 teaspoon of salt in 8 ounces of water or  teaspoon of baking soda in 8 ounces of water. This should be done at least after each meal and at bedtime. . If you have mouth sores, avoid mouthwash that has alcohol. Avoid alcohol and smoking because they can bother your mouth and throat.  Food and Drug Interactions There are no known interactions of this medicine with food. Tell your doctor if you are taking ibuprofen. Pemetrexed may interact with other medicines. Tell your doctor and pharmacist about all the medicines and dietary supplements (vitamins, minerals, herbs and others) that you are taking at this time. The safety and use of dietary supplements and alternative diets are often not known. Using these might affect your  cancer or interfere with your treatment. Until more is known, you should not use dietary supplements or alternative diets without your cancer doctor's help.  When to Call the Doctor Call your doctor or nurse right away if you have any of these symptoms: . Temperature of 100.5 F (38 C) or above . Chills . Easy bruising or bleeding . Trouble breathing . Chest pain . Nausea that stops you from eating or drinking . Throwing up more than 3 times in a day  Call your doctor or nurse as soon as possible if you have any of these symptoms: . Rash that does not go away with prescribed medicine . Nausea or vomiting that does not go away with prescribed medicine . Extreme fatigue that interferes with normal activities  Reproduction Concerns . Pregnancy warning: This drug may have harmful effects on the unborn child, so effective methods of birth control should be used during your cancer treatment. . Genetic counseling is available for you to talk about the effects of this drug therapy on future pregnancies. Also, a genetic counselor can look at the possible risk of problems in the unborn baby due to this medicine if an exposure happens during pregnancy. . Breast feeding warning: It is not known if this drug passes into breast milk. For this reason, women should talk to their doctor about the risks and benefits of breast feeding during treatment with this drug because this drug may enter the breast milk and badly harm a breast feeding baby.  Pembrolizumab Beryle Flock)  About This Drug Pembrolizumab is used to treat cancer. It is given in the vein (IV).  This drug will take 30 minutes to infuse.  Possible Side Effects . Tiredness . Fever . Nausea . Decreased appetite (decreased hunger) . Loose bowel movements (diarrhea) . Constipation (not able to move bowels) . Trouble breathing . Rash . Itching . Muscle and bone pain . Cough Note: Each of the side effects above was reported in 20% or greater  of patients treated with pembrolizumab. Not all possible side effects are included above.  Warnings and Precautions . This drug works with your immune system and can cause inflammation in any of your organs and tissues and can change how they work. This may put you at risk for developing serious medical problems which can very rarely be fatal. . Colitis (swelling (inflammation) in the colon) - symptoms are loose bowel movements (diarrhea) stomach cramping, and sometimes blood in the bowel movements . Changes in liver function. Your liver function will be checked as needed. . Changes in kidney function, which can very rarely be fatal. Your kidney function will be checked as needed. . Inflammation (swelling) of the lungs which can very rarely be fatal - you may have a dry cough or trouble breathing. . This drug may affect some of your hormone glands (especially the thyroid, adrenals, pituitary and pancreas). Your hormone levels will be checked as needed. . Blood sugar levels may change and you may develop diabetes. If you already have diabetes, changes may need to be made to your diabetes medication. . Severe allergic skin reaction, which can very rarely be fatal. You may develop blisters on your skin that are filled with fluid or a severe red rash all over your body that may be painful. . Increased risk of organ rejection in patients who have received donor organs . Increased risk of complications in patients who will undergo a stem cell transplant after receiving pembrolizumab. . While you are getting this drug in your vein (IV), you may have a reaction to the drug. Your nurse will check you closely for these signs: fever or shaking chills, flushing, facial swelling, feeling dizzy, headache, trouble breathing, rash, itching, chest tightness, or chest pain. These reactions may occur after your infusion. If this happens, call 911 for emergency care.  Important Information . This drug may be present in  the saliva, tears, sweat, urine, stool, vomit, semen, and vaginal secretions. Talk to your doctor and/or your nurse  about the necessary precautions to take during this time.  Treating Side Effects . Ask your doctor or nurse about medicines that are available to help stop or lessen constipation, diarrhea and/or nausea. . Drink plenty of fluids (a minimum of eight glasses per day is recommended). . If you are not able to move your bowels, check with your doctor or nurse before you use any enemas, laxatives, or suppositories . To help with nausea and vomiting, eat small, frequent meals instead of three large meals a day. Choose foods and drinks that are at room temperature. Ask your nurse or doctor about other helpful tips and medicine that is available to help or stop lessen these symptoms. . If you get diarrhea, eat low-fiber foods that are high in protein and calories and avoid foods that can irritate your digestive tracts or lead to cramping. Ask your nurse or doctor about medicine that can lessen or stop your diarrhea. . Manage tiredness by pacing your activities for the day. Be sure to include periods of rest between energy-draining activities . Keeping your pain under control is important to your wellbeing. Please tell your doctor or nurse if you are experiencing pain. . If you have diabetes, keep good control of your blood sugar level. Tell your nurse or your doctor if your glucose levels are higher or lower than normal . If you get a rash do not put anything on it unless your doctor or nurse says you may. Keep the area around the rash clean and dry. Ask your doctor for medicine if your rash bothers you. . Infusion reactions may happen for 24 hours after your infusion. If this happens, call 911 for emergency care.  Food and Drug Interactions . There are no known interactions of pembrolizumab with food. . There are no known interactions of pembrolizumab with other medications. . Tell your doctor  and pharmacist about all the medicines and dietary supplements (vitamins, minerals, herbs and others) that you are taking at this time. The safety and use of dietary supplements and alternative agents are often not known. Using these might affect your cancer or interfere with your treatment. Until more is known, you should not use dietary supplements or alternative agents without your cancer doctor's help.   When to Call the Doctor Call your doctor or nurse if you have any of the following symptoms and/or any new or unusual symptoms: . Fever of 100.5 F (38 C) or higher . Chills . Wheezing or trouble breathing . Rash or itching . Feeling dizzy or lightheaded . Loose bowel movements (diarrhea) more than 4 times a day or diarrhea with weakness or lightheadedness . Nausea that stops you from eating or drinking, and/or that is not relieved by prescribed medicines . Lasting loss of appetite or rapid weight loss of five pounds in a week . Fatigue that interferes with your daily activities . No bowel movement for 3 days or you feel uncomfortable . Extreme weakness that interferes with normal activities . Bad abdominal pain, especially in upper right area . Decreased urine . Unusual thirst or passing urine often . Rash that is not relieved by prescribed medicines . Flu-like symptoms: fever, headache, muscle and joint aches, and fatigue (low energy, feeling weak) . Signs of liver problems: dark urine, pale bowel movements, bad stomach pain, feeling very tired and weak, unusual itching, or yellowing of the eyes or skin . Signs of infusion reactions such as fever or shaking chills, flushing, facial swelling, feeling dizzy, headache, trouble  breathing, rash, itching, chest tightness, or chest pain. . If you think you are pregnant  Reproduction Warnings . Pregnancy warning: This drug may have harmful effects on the unborn baby. Women of childbearing potential should use effective methods of birth control  during your cancer treatment and for at least 4 months after treatment. Let your doctor know right away if you think you may be pregnant . Breast feeding warning: It is not known if this drug passes into breast milk. It is recommended that women do not breastfeed during treatment and for 4 months after treatment. . Fertility warning: Human fertility studies have not been done with this drug. Talk with your doctor or nurse if you plan to have children.  SELF CARE ACTIVITIES WHILE ON CHEMOTHERAPY:  Hydration Increase your fluid intake 48 hours prior to treatment and drink at least 8 to 12 cups (64 ounces) of water/decaffeinated beverages per day after treatment. You can still have your cup of coffee or soda but these beverages do not count as part of your 8 to 12 cups that you need to drink daily. No alcohol intake.  Medications Continue taking your normal prescription medication as prescribed.  If you start any new herbal or new supplements please let us know first to make sure it is safe.  Mouth Care Have teeth cleaned professionally before starting treatment. Keep dentures and partial plates clean. Use soft toothbrush and do not use mouthwashes that contain alcohol. Biotene is a good mouthwash that is available at most pharmacies or may be ordered by calling [phone]. Use warm salt water gargles (1 teaspoon salt per 1 quart warm water) before and after meals and at bedtime. Or you may rinse with 2 tablespoons of three-percent hydrogen peroxide mixed in eight ounces of water. If you are still having problems with your mouth or sores in your mouth please call the clinic. If you need dental work, please let the doctor know before you go for your appointment so that we can coordinate the best possible time for you in regards to your chemo regimen. You need to also let your dentist know that you are actively taking chemo. We may need to do labs prior to your dental appointment.  Skin Care Always  use sunscreen that has not expired and with SPF (Sun Protection Factor) of 50 or higher. Wear hats to protect your head from the sun. Remember to use sunscreen on your hands, ears, face, & feet.  Use good moisturizing lotions such as udder cream, eucerin, or even Vaseline. Some chemotherapies can cause dry skin, color changes in your skin and nails.    . Avoid long, hot showers or baths. . Use gentle, fragrance-free soaps and laundry detergent. . Use moisturizers, preferably creams or ointments rather than lotions because the thicker consistency is better at preventing skin dehydration. Apply the cream or ointment within 15 minutes of showering. Reapply moisturizer at night, and moisturize your hands every time after you wash them.  Hair Loss (if your doctor says your hair will fall out)  . If your doctor says that your hair is likely to fall out, decide before you begin chemo whether you want to wear a wig. You may want to shop before treatment to match your hair color. . Hats, turbans, and scarves can also camouflage hair loss, although some people prefer to leave their heads uncovered. If you go bare-headed outdoors, be sure to use sunscreen on your scalp. . Cut your hair short. It eases  the inconvenience of shedding lots of hair, but it also can reduce the emotional impact of watching your hair fall out. . Don't perm or color your hair during chemotherapy. Those chemical treatments are already damaging to hair and can enhance hair loss. Once your chemo treatments are done and your hair has grown back, it's OK to resume dyeing or perming hair. With chemotherapy, hair loss is almost always temporary. But when it grows back, it may be a different color or texture. In older adults who still had hair color before chemotherapy, the new growth may be completely gray.  Often, new hair is very fine and soft.  Infection Prevention Please wash your hands for at least 30 seconds using warm soapy water.  Handwashing is the #1 way to prevent the spread of germs. Stay away from sick people or people who are getting over a cold. If you develop respiratory systems such as green/yellow mucus production or productive cough or persistent cough let us know and we will see if you need an antibiotic. It is a good idea to keep a pair of gloves on when going into grocery stores/[employer] to decrease your risk of coming into contact with germs on the carts, etc. Carry alcohol hand gel with you at all times and use it frequently if out in public. If your temperature reaches 100.5 or higher please call the clinic and let us know.  If it is after hours or on the weekend please go to the ER if your temperature is over 100.5.  Please have your own personal thermometer at home to use.    Sex and bodily fluids If you are going to have sex, a condom must be used to protect the person that isn't taking chemotherapy. Chemo can decrease your libido (sex drive). For a few days after chemotherapy, chemotherapy can be excreted through your bodily fluids.  When using the toilet please close the lid and flush the toilet twice.  Do this for a few day after you have had chemotherapy.   Effects of chemotherapy on your sex life Some changes are simple and won't last long. They won't affect your sex life permanently. Sometimes you may feel: . too tired . not strong enough to be very active . sick or sore  . not in the mood . anxious or low Your anxiety might not seem related to sex. For example, you may be worried about the cancer and how your treatment is going. Or you may be worried about money, or about how you family are coping with your illness. These things can cause stress, which can affect your interest in sex. It's important to talk to your partner about how you feel. Remember - the changes to your sex life don't usually last long. There's usually no medical reason to stop having sex during chemo. The drugs won't have any long  term physical effects on your performance or enjoyment of sex. Cancer can't be passed on to your partner during sex  Contraception It's important to use reliable contraception during treatment. Avoid getting pregnant while you or your partner are having chemotherapy. This is because the drugs may harm the baby. Sometimes chemotherapy drugs can leave a man or woman infertile.  This means you would not be able to have children in the future. You might want to talk to someone about permanent infertility. It can be very difficult to learn that you may no longer be able to have children. Some people find counselling helpful.  There might be ways to preserve your fertility, although this is easier for men than for women. You may want to speak to a fertility expert. You can talk about sperm banking or harvesting your eggs. You can also ask about other fertility options, such as donor eggs. If you have or have had breast cancer, your doctor might advise you not to take the contraceptive pill. This is because the hormones in it might affect the cancer.  It is not known for sure whether or not chemotherapy drugs can be passed on through semen or secretions from the vagina. Because of this some doctors advise people to use a barrier method if you have sex during treatment. This applies to vaginal, anal or oral sex. Generally, doctors advise a barrier method only for the time you are actually having the treatment and for about a week after your treatment. Advice like this can be worrying, but this does not mean that you have to avoid being intimate with your partner. You can still have close contact with your partner and continue to enjoy sex.  Animals If you have cats or birds we just ask that you not change the litter or change the cage.  Please have someone else do this for you while you are on chemotherapy.   Food Safety During and After Cancer Treatment Food safety is important for people both during and after  cancer treatment. Cancer and cancer treatments, such as chemotherapy, radiation therapy, and stem cell/bone marrow transplantation, often weaken the immune system. This makes it harder for your body to protect itself from foodborne illness, also called food poisoning. Foodborne illness is caused by eating food that contains harmful bacteria, parasites, or viruses.  Foods to avoid Some foods have a higher risk of becoming tainted with bacteria. These include: Marland Kitchen Unwashed fresh fruit and vegetables, especially leafy vegetables that can hide dirt and other contaminants . Raw sprouts, such as alfalfa sprouts . Raw or undercooked beef, especially ground beef, or other raw or undercooked meat and poultry . Fatty, fried, or spicy foods immediately before or after treatment.  These can sit heavy on your stomach and make you feel nauseous. . Raw or undercooked shellfish, such as oysters. . Sushi and sashimi, which often contain raw fish.  . Unpasteurized beverages, such as unpasteurized fruit juices, raw milk, raw yogurt, or cider . Undercooked eggs, such as soft boiled, over easy, and poached; raw, unpasteurized eggs; or foods made with raw egg, such as homemade raw cookie dough and homemade mayonnaise Simple steps for food safety Shop smart. . Do not buy food stored or displayed in an unclean area. . Do not buy bruised or damaged fruits or vegetables. . Do not buy cans that have cracks, dents, or bulges. . Pick up foods that can spoil at the end of your shopping trip and store them in a cooler on the way home. Prepare and clean up foods carefully. . Rinse all fresh fruits and vegetables under running water, and dry them with a clean towel or paper towel. . Clean the top of cans before opening them. . After preparing food, wash your hands for 20 seconds with hot water and soap. Pay special attention to areas between fingers and under nails. . Clean your utensils and dishes with hot water and  soap. Marland Kitchen Disinfect your kitchen and cutting boards using 1 teaspoon of liquid, unscented bleach mixed into 1 quart of water.   Dispose of old food. . Eat canned and  packaged food before its expiration date (the "use by" or "best before" date). . Consume refrigerated leftovers within 3 to 4 days. After that time, throw out the food. Even if the food does not smell or look spoiled, it still may be unsafe. Some bacteria, such as Listeria, can grow even on foods stored in the refrigerator if they are kept for too long. Take precautions when eating out. . At restaurants, avoid buffets and salad bars where food sits out for a long time and comes in contact with many people. Food can become contaminated when someone with a virus, often a norovirus, or another "bug" handles it. . Put any leftover food in a "to-go" container yourself, rather than having the server do it. And, refrigerate leftovers as soon as you get home. . Choose restaurants that are clean and that are willing to prepare your food as you order it cooked.   MEDICATIONS:                                                                                                                                                               Dexamethasone 4 mg tablet - take one tablet twice daily the day before treatment and then take 2 tablets once daily for 3 days after treatment  Compazine/Prochlorperazine 10mg  tablet. Take 1 tablet every 6 hours as needed for nausea/vomiting. (This can make you sleepy)   EMLA cream. Apply a quarter size amount to port site 1 hour prior to chemo. Do not rub in. Cover with plastic wrap.   Over-the-Counter Meds:  Colace - 100 mg capsules - take 2 capsules daily.  If this doesn't help then you can increase to 2 capsules twice daily.  Call us if this does not help your bowels move.   Imodium 2mg  capsule. Take 2 capsules after the 1st loose stool and then 1 capsule every 2 hours until you go a total of 12 hours  without having a loose stool. Call the [facility] if loose stools continue. If diarrhea occurs at bedtime, take 2 capsules at bedtime. Then take 2 capsules every 4 hours until morning. Call [facility].    Diarrhea Sheet   If you are having loose stools/diarrhea, please purchase Imodium and begin taking as outlined:  At the first sign of poorly formed or loose stools you should begin taking Imodium (loperamide) 2 mg capsules.  Take two caplets (4mg ) followed by one caplet (2mg ) every 2 hours until you have had no diarrhea for 12 hours.  During the night take two caplets (4mg ) at bedtime and continue every 4 hours during the night until the morning.  Stop taking Imodium only after there is no sign of diarrhea for 12 hours.    Always call the [place] if you are  having loose stools/diarrhea that you can't get under control.  Loose stools/diarrhea leads to dehydration (loss of water) in your body.  We have other options of trying to get the loose stools/diarrhea to stop but you must let us know!   Constipation Sheet  Colace - 100 mg capsules - take 2 capsules daily.  If this doesn't help then you can increase to 2 capsules twice daily.  Please call if the above does not work for you.   Do not go more than 2 days without a bowel movement.  It is very important that you do not become constipated.  It will make you feel sick to your stomach (nausea) and can cause abdominal pain and vomiting.   Nausea Sheet   Compazine/Prochlorperazine 10mg  tablet. Take 1 tablet every 6 hours as needed for nausea/vomiting. (This can make you sleepy)  If you are having persistent nausea (nausea that does not stop) please call the [facility] and let us know the amount of nausea that you are experiencing.  If you begin to vomit, you need to call the [facility] and if it is the weekend and you have vomited more than one time and can't get it to stop-go to the Emergency Room.  Persistent nausea/vomiting  can lead to dehydration (loss of fluid in your body) and will make you feel terrible.   Ice chips, sips of clear liquids, foods that are @ room temperature, crackers, and toast tend to be better tolerated.   SYMPTOMS TO REPORT AS SOON AS POSSIBLE AFTER TREATMENT:   FEVER GREATER THAN 100.5 F  CHILLS WITH OR WITHOUT FEVER  NAUSEA AND VOMITING THAT IS NOT CONTROLLED WITH YOUR NAUSEA MEDICATION  UNUSUAL SHORTNESS OF BREATH  UNUSUAL BRUISING OR BLEEDING  TENDERNESS IN MOUTH AND THROAT WITH OR WITHOUT PRESENCE OF ULCERS  URINARY PROBLEMS  BOWEL PROBLEMS  UNUSUAL RASH      Wear comfortable clothing and clothing appropriate for easy access to any Portacath or PICC line. Let us know if there is anything that we can do to make your therapy better!    What to do if you need assistance after hours or on the weekends: CALL [phone].  HOLD on the line, do not hang up.  You will hear multiple messages but at the end you will be connected with a nurse triage line.  They will contact the doctor if necessary.  Most of the time they will be able to assist you.  Do not call the hospital operator.      I have been informed and understand all of the instructions given to me and have received a copy. I have been instructed to call the clinic [phone] or my family physician as soon as possible for continued medical care, if indicated. I do not have any more questions at this time but understand that I may call the [facility] or the Patient Navigator at [phone] during office hours should I have questions or need assistance in obtaining follow-up care.

## 2017-12-25 NOTE — Progress Notes (Signed)
Chemotherapy teaching packet pulled together.

## 2017-12-25 NOTE — Progress Notes (Signed)
Cancer conference 12/25/17 discussion documented for communication purpose.  Patient was not in attendance nor seen by physician.

## 2017-12-26 ENCOUNTER — Ambulatory Visit (HOSPITAL_COMMUNITY): Payer: Medicare HMO

## 2017-12-26 ENCOUNTER — Encounter (HOSPITAL_COMMUNITY): Payer: Self-pay | Admitting: Thoracic Surgery (Cardiothoracic Vascular Surgery)

## 2017-12-26 NOTE — Addendum Note (Signed)
Addendum  created 12/26/17 1557 by Lillia Abed, MD   Intraprocedure Event edited, Intraprocedure Staff edited

## 2017-12-29 ENCOUNTER — Inpatient Hospital Stay (HOSPITAL_COMMUNITY): Payer: Medicare HMO

## 2017-12-29 ENCOUNTER — Encounter (HOSPITAL_COMMUNITY): Payer: Self-pay

## 2017-12-29 VITALS — BP 162/60 | HR 70 | Temp 97.5°F | Resp 18 | Wt 135.4 lb

## 2017-12-29 DIAGNOSIS — C3480 Malignant neoplasm of overlapping sites of unspecified bronchus and lung: Secondary | ICD-10-CM

## 2017-12-29 DIAGNOSIS — I10 Essential (primary) hypertension: Secondary | ICD-10-CM | POA: Diagnosis not present

## 2017-12-29 DIAGNOSIS — R918 Other nonspecific abnormal finding of lung field: Secondary | ICD-10-CM

## 2017-12-29 DIAGNOSIS — G939 Disorder of brain, unspecified: Secondary | ICD-10-CM | POA: Diagnosis not present

## 2017-12-29 DIAGNOSIS — Z79899 Other long term (current) drug therapy: Secondary | ICD-10-CM | POA: Diagnosis not present

## 2017-12-29 DIAGNOSIS — F17210 Nicotine dependence, cigarettes, uncomplicated: Secondary | ICD-10-CM | POA: Diagnosis not present

## 2017-12-29 DIAGNOSIS — I700 Atherosclerosis of aorta: Secondary | ICD-10-CM | POA: Diagnosis not present

## 2017-12-29 DIAGNOSIS — R0789 Other chest pain: Secondary | ICD-10-CM | POA: Diagnosis not present

## 2017-12-29 DIAGNOSIS — J439 Emphysema, unspecified: Secondary | ICD-10-CM | POA: Diagnosis not present

## 2017-12-29 DIAGNOSIS — Z7982 Long term (current) use of aspirin: Secondary | ICD-10-CM | POA: Diagnosis not present

## 2017-12-29 LAB — CBC WITH DIFFERENTIAL/PLATELET
Abs Immature Granulocytes: 0.01 10*3/uL (ref 0.00–0.07)
BASOS PCT: 0 %
Basophils Absolute: 0 10*3/uL (ref 0.0–0.1)
EOS PCT: 0 %
Eosinophils Absolute: 0 10*3/uL (ref 0.0–0.5)
HCT: 43 % (ref 36.0–46.0)
Hemoglobin: 13.1 g/dL (ref 12.0–15.0)
Immature Granulocytes: 0 %
Lymphocytes Relative: 11 %
Lymphs Abs: 0.5 10*3/uL — ABNORMAL LOW (ref 0.7–4.0)
MCH: 26 pg (ref 26.0–34.0)
MCHC: 30.5 g/dL (ref 30.0–36.0)
MCV: 85.3 fL (ref 80.0–100.0)
MONO ABS: 0.3 10*3/uL (ref 0.1–1.0)
MONOS PCT: 6 %
Neutro Abs: 3.9 10*3/uL (ref 1.7–7.7)
Neutrophils Relative %: 83 %
PLATELETS: 341 10*3/uL (ref 150–400)
RBC: 5.04 MIL/uL (ref 3.87–5.11)
RDW: 12.6 % (ref 11.5–15.5)
WBC: 4.7 10*3/uL (ref 4.0–10.5)
nRBC: 0 % (ref 0.0–0.2)

## 2017-12-29 LAB — COMPREHENSIVE METABOLIC PANEL
ALT: 6 U/L (ref 0–44)
ANION GAP: 7 (ref 5–15)
AST: 15 U/L (ref 15–41)
Albumin: 3.6 g/dL (ref 3.5–5.0)
Alkaline Phosphatase: 46 U/L (ref 38–126)
BUN: 10 mg/dL (ref 8–23)
CHLORIDE: 104 mmol/L (ref 98–111)
CO2: 25 mmol/L (ref 22–32)
CREATININE: 0.58 mg/dL (ref 0.44–1.00)
Calcium: 9.4 mg/dL (ref 8.9–10.3)
Glucose, Bld: 125 mg/dL — ABNORMAL HIGH (ref 70–99)
POTASSIUM: 4 mmol/L (ref 3.5–5.1)
Sodium: 136 mmol/L (ref 135–145)
Total Bilirubin: 0.7 mg/dL (ref 0.3–1.2)
Total Protein: 7.8 g/dL (ref 6.5–8.1)

## 2017-12-29 LAB — TSH: TSH: 0.381 u[IU]/mL (ref 0.350–4.500)

## 2017-12-29 MED ORDER — SODIUM CHLORIDE 0.9 % IV SOLN
Freq: Once | INTRAVENOUS | Status: AC
  Administered 2017-12-29: 10:00:00 via INTRAVENOUS

## 2017-12-29 MED ORDER — SODIUM CHLORIDE 0.9% FLUSH
10.0000 mL | INTRAVENOUS | Status: DC | PRN
  Administered 2017-12-29: 10 mL
  Filled 2017-12-29: qty 10

## 2017-12-29 MED ORDER — SODIUM CHLORIDE 0.9 % IV SOLN
Freq: Once | INTRAVENOUS | Status: AC
  Administered 2017-12-29: 11:00:00 via INTRAVENOUS
  Filled 2017-12-29: qty 5

## 2017-12-29 MED ORDER — PALONOSETRON HCL INJECTION 0.25 MG/5ML
0.2500 mg | Freq: Once | INTRAVENOUS | Status: AC
  Administered 2017-12-29: 0.25 mg via INTRAVENOUS
  Filled 2017-12-29: qty 5

## 2017-12-29 MED ORDER — SODIUM CHLORIDE 0.9 % IV SOLN
515.0000 mg/m2 | Freq: Once | INTRAVENOUS | Status: AC
  Administered 2017-12-29: 900 mg via INTRAVENOUS
  Filled 2017-12-29: qty 16

## 2017-12-29 MED ORDER — SODIUM CHLORIDE 0.9 % IV SOLN
356.5000 mg | Freq: Once | INTRAVENOUS | Status: AC
  Administered 2017-12-29: 360 mg via INTRAVENOUS
  Filled 2017-12-29: qty 36

## 2017-12-29 MED ORDER — HEPARIN SOD (PORK) LOCK FLUSH 100 UNIT/ML IV SOLN
500.0000 [IU] | Freq: Once | INTRAVENOUS | Status: AC | PRN
  Administered 2017-12-29: 500 [IU]

## 2017-12-29 MED ORDER — SODIUM CHLORIDE 0.9 % IV SOLN
200.0000 mg | Freq: Once | INTRAVENOUS | Status: AC
  Administered 2017-12-29: 200 mg via INTRAVENOUS
  Filled 2017-12-29: qty 8

## 2017-12-29 NOTE — Progress Notes (Signed)
[place] reviewed with Dr. Walden Field and pt approved for chemo tx today per MD            Tammy Sours tolerated chemo tx well without complaints or incident.Drug-specific information given to and reviewed with pt. Pt verbalized understanding with all questions answered and permit signed. VSS upon discharge. Pt discharged self ambulatory in satisfactory condition accompanied by family member

## 2017-12-29 NOTE — Patient Instructions (Signed)
[facility] Discharge Instructions for Patients Receiving Chemotherapy   Beginning January 23rd 2017 lab work for the [facility] will be done in the  Main lab at [facility] on 1st floor. If you have a lab appointment with the [facility] please come in thru the  Main Entrance and check in at the main information desk   Today you received the following chemotherapy agents Carboplatin,Keytruda and Alimta. Follow-up as scheduled. Call clinic for any questions or concerns  To help prevent nausea and vomiting after your treatment, we encourage you to take your nausea medication   If you develop nausea and vomiting, or diarrhea that is not controlled by your medication, call the clinic.  The clinic phone number is [phone]. Office hours are Monday-Friday 8:30am-5:00pm.  BELOW ARE SYMPTOMS THAT SHOULD BE REPORTED IMMEDIATELY:  *FEVER GREATER THAN 101.0 F  *CHILLS WITH OR WITHOUT FEVER  NAUSEA AND VOMITING THAT IS NOT CONTROLLED WITH YOUR NAUSEA MEDICATION  *UNUSUAL SHORTNESS OF BREATH  *UNUSUAL BRUISING OR BLEEDING  TENDERNESS IN MOUTH AND THROAT WITH OR WITHOUT PRESENCE OF ULCERS  *URINARY PROBLEMS  *BOWEL PROBLEMS  UNUSUAL RASH Items with * indicate a potential emergency and should be followed up as soon as possible. If you have an emergency after office hours please contact your primary care physician or go to the nearest emergency department.  Please call the clinic during office hours if you have any questions or concerns.   You may also contact the Patient Navigator at [phone] should you have any questions or need assistance in obtaining follow up care.      Resources For Cancer Patients and their Caregivers ? American Cancer Society: Can assist with transportation, wigs, general needs, runs Look Good Feel Better.        435-522-0888 ? Cancer Care: Provides financial assistance, online support groups, medication/co-pay assistance.   1-800-813-HOPE (681)533-0762) ? [facility] Assists [place] cancer patients and their families through emotional , educational and financial support.  330-427-2106 ? [place] DSS Where to apply for food stamps, Medicaid and utility assistance. (774)784-7529 ? [facility]: Transportation to medical appointments. (618)711-1916 ? Social Security Administration: May apply for disability if have a Stage IV cancer. ([phone] [phone] ? [employer], Disability and Transit Services: Assists with nutrition, care and transit needs. 201-142-1272

## 2017-12-30 ENCOUNTER — Telehealth (HOSPITAL_COMMUNITY): Payer: Self-pay | Admitting: *Deleted

## 2017-12-30 NOTE — Telephone Encounter (Signed)
24 hour callback. No answer. Message left for patient that I was calling to check on her and to call us back for any questions or concerns.

## 2017-12-31 NOTE — Progress Notes (Addendum)
Late entry: 11/25@ 0900 Extensive teaching packet given to patient. Reviewed chemotherapy medications with patient.   Reviewed side effects and how to manage side effects. Reviewed foods to avoid. Reviewed self care   activities such as hydration, infection prevention, skin care, sexual activity and how to prepare   foods both in home and out at restaurants. I explained to patient/family the resources available to   her here at [facility] as well as within the system for our patients. I explained how to get in   touch with [place] both during and after hours. My phone number provided to patient as resource if she   has an questions or needs guidance in any way. Patient and family express appreciation and   verbalize understanding. Consent for chemotherapy was obtained during teaching session.

## 2018-01-15 ENCOUNTER — Encounter (HOSPITAL_COMMUNITY): Payer: Self-pay | Admitting: Internal Medicine

## 2018-01-20 ENCOUNTER — Inpatient Hospital Stay (HOSPITAL_COMMUNITY): Payer: Medicare HMO | Attending: Hematology

## 2018-01-20 ENCOUNTER — Inpatient Hospital Stay (HOSPITAL_BASED_OUTPATIENT_CLINIC_OR_DEPARTMENT_OTHER): Payer: Medicare HMO | Admitting: Internal Medicine

## 2018-01-20 ENCOUNTER — Encounter (HOSPITAL_COMMUNITY): Payer: Self-pay | Admitting: Internal Medicine

## 2018-01-20 ENCOUNTER — Inpatient Hospital Stay (HOSPITAL_COMMUNITY): Payer: Medicare HMO

## 2018-01-20 VITALS — BP 160/58 | HR 64 | Temp 98.3°F | Resp 18 | Wt 131.8 lb

## 2018-01-20 VITALS — BP 150/70 | HR 60 | Temp 97.8°F | Resp 18

## 2018-01-20 DIAGNOSIS — R634 Abnormal weight loss: Secondary | ICD-10-CM

## 2018-01-20 DIAGNOSIS — C3480 Malignant neoplasm of overlapping sites of unspecified bronchus and lung: Secondary | ICD-10-CM

## 2018-01-20 DIAGNOSIS — C3482 Malignant neoplasm of overlapping sites of left bronchus and lung: Secondary | ICD-10-CM

## 2018-01-20 DIAGNOSIS — G939 Disorder of brain, unspecified: Secondary | ICD-10-CM | POA: Insufficient documentation

## 2018-01-20 DIAGNOSIS — I10 Essential (primary) hypertension: Secondary | ICD-10-CM

## 2018-01-20 DIAGNOSIS — Z87891 Personal history of nicotine dependence: Secondary | ICD-10-CM | POA: Insufficient documentation

## 2018-01-20 DIAGNOSIS — I700 Atherosclerosis of aorta: Secondary | ICD-10-CM | POA: Diagnosis not present

## 2018-01-20 DIAGNOSIS — R0789 Other chest pain: Secondary | ICD-10-CM | POA: Insufficient documentation

## 2018-01-20 DIAGNOSIS — Z79899 Other long term (current) drug therapy: Secondary | ICD-10-CM | POA: Diagnosis not present

## 2018-01-20 DIAGNOSIS — E785 Hyperlipidemia, unspecified: Secondary | ICD-10-CM | POA: Insufficient documentation

## 2018-01-20 DIAGNOSIS — J439 Emphysema, unspecified: Secondary | ICD-10-CM | POA: Insufficient documentation

## 2018-01-20 DIAGNOSIS — Z5112 Encounter for antineoplastic immunotherapy: Secondary | ICD-10-CM | POA: Diagnosis not present

## 2018-01-20 DIAGNOSIS — Z5111 Encounter for antineoplastic chemotherapy: Secondary | ICD-10-CM | POA: Diagnosis not present

## 2018-01-20 DIAGNOSIS — Z7982 Long term (current) use of aspirin: Secondary | ICD-10-CM | POA: Insufficient documentation

## 2018-01-20 LAB — COMPREHENSIVE METABOLIC PANEL
ALT: 10 U/L (ref 0–44)
ANION GAP: 8 (ref 5–15)
AST: 15 U/L (ref 15–41)
Albumin: 3.4 g/dL — ABNORMAL LOW (ref 3.5–5.0)
Alkaline Phosphatase: 52 U/L (ref 38–126)
BILIRUBIN TOTAL: 0.9 mg/dL (ref 0.3–1.2)
BUN: 11 mg/dL (ref 8–23)
CO2: 24 mmol/L (ref 22–32)
Calcium: 9 mg/dL (ref 8.9–10.3)
Chloride: 106 mmol/L (ref 98–111)
Creatinine, Ser: 0.53 mg/dL (ref 0.44–1.00)
GFR calc Af Amer: >60 mL/min (ref >60)
GFR calc non Af Amer: >60 mL/min (ref >60)
Glucose, Bld: 110 mg/dL — ABNORMAL HIGH (ref 70–99)
Potassium: 3.6 mmol/L (ref 3.5–5.1)
Sodium: 138 mmol/L (ref 135–145)
TOTAL PROTEIN: 7 g/dL (ref 6.5–8.1)

## 2018-01-20 LAB — CBC WITH DIFFERENTIAL/PLATELET
Abs Immature Granulocytes: 0.02 10*3/uL (ref 0.00–0.07)
Basophils Absolute: 0 10*3/uL (ref 0.0–0.1)
Basophils Relative: 0 %
EOS PCT: 1 %
Eosinophils Absolute: 0 10*3/uL (ref 0.0–0.5)
HCT: 40.4 % (ref 36.0–46.0)
Hemoglobin: 11.9 g/dL — ABNORMAL LOW (ref 12.0–15.0)
Immature Granulocytes: 1 %
Lymphocytes Relative: 16 %
Lymphs Abs: 0.6 10*3/uL — ABNORMAL LOW (ref 0.7–4.0)
MCH: 24.9 pg — ABNORMAL LOW (ref 26.0–34.0)
MCHC: 29.5 g/dL — ABNORMAL LOW (ref 30.0–36.0)
MCV: 84.7 fL (ref 80.0–100.0)
Monocytes Absolute: 0.7 10*3/uL (ref 0.1–1.0)
Monocytes Relative: 17 %
Neutro Abs: 2.6 10*3/uL (ref 1.7–7.7)
Neutrophils Relative %: 65 %
Platelets: 330 10*3/uL (ref 150–400)
RBC: 4.77 MIL/uL (ref 3.87–5.11)
RDW: 13.7 % (ref 11.5–15.5)
WBC: 3.9 10*3/uL — ABNORMAL LOW (ref 4.0–10.5)
nRBC: 0 % (ref 0.0–0.2)

## 2018-01-20 MED ORDER — HEPARIN SOD (PORK) LOCK FLUSH 100 UNIT/ML IV SOLN
500.0000 [IU] | Freq: Once | INTRAVENOUS | Status: AC | PRN
  Administered 2018-01-20: 500 [IU]

## 2018-01-20 MED ORDER — SODIUM CHLORIDE 0.9 % IV SOLN
Freq: Once | INTRAVENOUS | Status: AC
  Administered 2018-01-20: 10:00:00 via INTRAVENOUS
  Filled 2018-01-20: qty 5

## 2018-01-20 MED ORDER — SODIUM CHLORIDE 0.9% FLUSH
10.0000 mL | INTRAVENOUS | Status: DC | PRN
  Administered 2018-01-20: 10 mL
  Filled 2018-01-20: qty 10

## 2018-01-20 MED ORDER — SODIUM CHLORIDE 0.9 % IV SOLN
200.0000 mg | Freq: Once | INTRAVENOUS | Status: AC
  Administered 2018-01-20: 200 mg via INTRAVENOUS
  Filled 2018-01-20: qty 8

## 2018-01-20 MED ORDER — SODIUM CHLORIDE 0.9 % IV SOLN
356.5000 mg | Freq: Once | INTRAVENOUS | Status: AC
  Administered 2018-01-20: 360 mg via INTRAVENOUS
  Filled 2018-01-20: qty 36

## 2018-01-20 MED ORDER — HYDROCODONE-ACETAMINOPHEN 5-325 MG PO TABS: 1.0000 | ORAL_TABLET | ORAL | 0 refills | Status: DC | PRN

## 2018-01-20 MED ORDER — SODIUM CHLORIDE 0.9 % IV SOLN
Freq: Once | INTRAVENOUS | Status: AC
  Administered 2018-01-20: 09:00:00 via INTRAVENOUS

## 2018-01-20 MED ORDER — SODIUM CHLORIDE 0.9 % IV SOLN
900.0000 mg | Freq: Once | INTRAVENOUS | Status: AC
  Administered 2018-01-20: 900 mg via INTRAVENOUS
  Filled 2018-01-20: qty 20

## 2018-01-20 MED ORDER — PALONOSETRON HCL INJECTION 0.25 MG/5ML
INTRAVENOUS | Status: AC
  Filled 2018-01-20: qty 5

## 2018-01-20 MED ORDER — PALONOSETRON HCL INJECTION 0.25 MG/5ML
0.2500 mg | Freq: Once | INTRAVENOUS | Status: AC
  Administered 2018-01-20: 0.25 mg via INTRAVENOUS

## 2018-01-20 NOTE — Patient Instructions (Signed)
[facility] Discharge Instructions for Patients Receiving Chemotherapy  Today you received the following chemotherapy agents alimta, keytruda, and carbppolaitn.    If you develop nausea and vomiting that is not controlled by your nausea medication, call the clinic.   BELOW ARE SYMPTOMS THAT SHOULD BE REPORTED IMMEDIATELY:  *FEVER GREATER THAN 100.5 F  *CHILLS WITH OR WITHOUT FEVER  NAUSEA AND VOMITING THAT IS NOT CONTROLLED WITH YOUR NAUSEA MEDICATION  *UNUSUAL SHORTNESS OF BREATH  *UNUSUAL BRUISING OR BLEEDING  TENDERNESS IN MOUTH AND THROAT WITH OR WITHOUT PRESENCE OF ULCERS  *URINARY PROBLEMS  *BOWEL PROBLEMS  UNUSUAL RASH Items with * indicate a potential emergency and should be followed up as soon as possible.  Feel free to call the clinic should you have any questions or concerns. The clinic phone number is [phone].  Please show the [facility] at check-in to the Emergency Department and triage nurse.

## 2018-01-20 NOTE — Progress Notes (Signed)
Patient tolerated treatment with no complaints voiced.  Port site clean and dry with no bruising or swelling noted at site.  Band aid applied.  VSS with discharge and left ambulatory with no s/s of distress noted.

## 2018-01-20 NOTE — Progress Notes (Signed)
Diagnosis Malignant neoplasm of overlapping sites of lung, unspecified laterality ([place]) - Plan: CBC with Differential/Platelet, Comprehensive metabolic panel, Lactate dehydrogenase, T4 AND TSH, DISCONTINUED: sodium chloride flush (NS) 0.9 % injection 10 mL, DISCONTINUED: heparin lock flush 100 unit/mL, DISCONTINUED: 0.9 %  sodium chloride infusion, DISCONTINUED: palonosetron (ALOXI) injection 0.25 mg, DISCONTINUED: fosaprepitant (EMEND) 150 mg, dexamethasone (DECADRON) 12 mg in sodium chloride 0.9 % 145 mL IVPB, DISCONTINUED: pembrolizumab (KEYTRUDA) 200 mg in sodium chloride 0.9 % 50 mL chemo infusion, DISCONTINUED: PEMEtrexed (ALIMTA) 875 mg in sodium chloride 0.9 % 100 mL chemo infusion, DISCONTINUED: CARBOplatin (PARAPLATIN) 360 mg in sodium chloride 0.9 % 100 mL chemo infusion  Staging Cancer Staging No matching staging information was found for the patient.  Assessment and Plan:   1.  Stage IV NSCLC.  Pt had CTA of the chest on 11/16/2017 with findings of  IMPRESSION: 1. 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 There are multiple adjacent nodules within the left upper lobe as well as scattered nodules throughout the lungs bilaterally concerning for metastatic disease. Left hilar adenopathy concerning for metastatic disease. 2. Indeterminate left adrenal nodule. 3. No evidence for acute pulmonary embolus. 4. Aortic Atherosclerosis (ICD10-I70.0) and Emphysema (ICD10-J43.9).  She reports weight loss of 10 pounds over past month.    Pet scan done 12/01/2017 reviewed and showed   IMPRESSION: 1. Hypermetabolic LEFT hilar mass which encroaches upon the AP window. 2. Nodularity in the anterior LEFT upper lobe and superior LEFT upper lobe consistent with primary or metastatic lung cancer. 3. Hypermetabolic metastatic pulmonary nodule within the RIGHT upper lobe. 4. Hypometabolism of the LEFT focal cord suggest paralysis and involvement of the LEFT recurrent  laryngeal nerve by the AP window tumor (correlate with hoarseness). 5. 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 6. No evidence of metastatic lung carcinoma outside the thorax.  MRI of brain done 12/01/2017 reviewed and showed  IMPRESSION: 1. 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 2. No other enhancing lesions to suggest metastatic disease. 3. No acute intracranial abnormality. 4. Mild chronic small vessel ischemic disease. 5. Chronic cerebellar infarcts.  Pt had biopsy done of LUL on 12/17/2017 with pathology returning as adenocarcinoma.  EBUS of level 11 node returned as NSCLC.    Pt has diagnosis of advanced NSCLC.  She is being treated with standard recommendation based on adenocarcinoma histology of Pembro dosed at 200 mg IV with Carboplatin AUC 5 with Alimta 500 mg/m2 every 3 weeks based on results of [place] study that showed an increase in PFS and overall survival with the addition of Keytruda to chemotherapy in lung cancer. Pt will be treated with 4 cycles of therapy with repeat imaging.  Continuation of  Alimta and Pembro for maintenance therapy pending response.  Pt should continue Y07 and folic acid as directed.  Foundation testing was unable to be done due to insufficient sample for analysis. Will discuss with pathology.    Pt here for evaluation prior to C2.  Pt will proceed with therapy.  Labs done 01/20/2018 reviewed and showed WBC 3.9 HB 11.9 plts 330,000.  Chemistries WNL with K+ 3.6 Cr 0.53 and normal LFTs.  Pt will be seen for follow-up in 3 weeks prior to C3.    2.  Brain lesion.  MRI of brain done due to advanced findings suspicious for malignancy on CTA done 12/01/2017 and was reviewed and showed impression:  1. 6 mm posterior right frontal extra-axial mass most likelyreflecting an incidental meningioma.  Pt has been seen by RT for evaluation based on the  brain lesion noted on MRI and due to PET findings.  Based on RT revew, the patient has findings consistent with multiple pulmonary metastases.  She also had a 0.6 cm extra-axial mass found on brain MRI scan.  This finding appears to represent a meningioma.  After reviewing this study, Dr. Lisbeth Renshaw is not concerned that this represents a metastasis but feels it is reasonable to obtain a repeat MRI scan in about 8 weeks for follow-up.   If this remains stable as expected, then further brain imaging could be spaced out further.     3.  HTN.  BP is 160/58.   Follow-up with PCP.    4.  Chest wall discomfort.  Pt reports this is improved.  CTA negative for PE.  Likely due to lung findings on CTA.  Rx for Vicodin sent to pharmacy.  # 90  25 minutes spent with review of records, counseling and coordination of care.    Interval History:  Historical data obtained from note dated 11/18/2017:  77 yr old female presented to ED due to shortness of breath.  She has a long smoking history.  She underwent CTA of the chest on 11/16/2017 with findings of  IMPRESSION: 1. 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 There are multiple adjacent nodules within the left upper lobe as well as scattered nodules throughout the lungs bilaterally concerning for metastatic disease. Left hilar adenopathy concerning for metastatic disease. 2. Indeterminate left adrenal nodule. 3. No evidence for acute pulmonary embolus. 4. Aortic Atherosclerosis (ICD10-I70.0) and Emphysema (ICD10-J43.9).  Current Status:  Pt is seen today for follow-up.  She is here for evaluation prior to C2 of Pembro, Carboplatin and Alimta 500 mg/m2     Lung cancer ([place])   12/23/2017 Initial Diagnosis    Lung cancer ([place])    12/29/2017 -  Chemotherapy    The patient had palonosetron (ALOXI) injection 0.25 mg, 0.25 mg, Intravenous,  Once, 2 of 6 cycles Administration: 0.25 mg (12/29/2017) PEMEtrexed (ALIMTA) 900 mg in sodium  chloride 0.9 % 100 mL chemo infusion, 515 mg/m2 = 875 mg, Intravenous,  Once, 2 of 6 cycles Administration: 900 mg (12/29/2017) CARBOplatin (PARAPLATIN) 360 mg in sodium chloride 0.9 % 250 mL chemo infusion, 360 mg (100 % of original dose 356.5 mg), Intravenous,  Once, 2 of 6 cycles Dose modification:   (original dose 356.5 mg, Cycle 1),   (original dose 356.5 mg, Cycle 2) Administration: 360 mg (12/29/2017) pembrolizumab (KEYTRUDA) 200 mg in sodium chloride 0.9 % 50 mL chemo infusion, 200 mg, Intravenous, Once, 2 of 6 cycles Administration: 200 mg (12/29/2017) fosaprepitant (EMEND) 150 mg, dexamethasone (DECADRON) 12 mg in sodium chloride 0.9 % 145 mL IVPB, , Intravenous,  Once, 2 of 6 cycles Administration:  (12/29/2017)  for chemotherapy treatment.       Problem List Patient Active Problem List   Diagnosis Date Noted  . Lung cancer ([place]) [C34.90] 12/23/2017  . Special screening for malignant neoplasms, colon [Z12.11]     Past Medical History Past Medical History:  Diagnosis Date  . Anemia   . Borderline diabetes   . Dyspnea   . Hyperlipidemia   . Hypertension   . Lung cancer ([place]) 12/23/2017  . Pneumonia   . Pre-diabetes     Past Surgical History Past Surgical History:  Procedure Laterality Date  .  ABDOMINAL HYSTERECTOMY    . APPENDECTOMY    . COLONOSCOPY N/A 07/16/2017   Procedure: COLONOSCOPY;  Surgeon: Danie Binder, MD;  Location[facility];  Service: Endoscopy;  Laterality: N/A;  10:30  . IR IMAGING GUIDED PORT INSERTION  12/24/2017  . POLYPECTOMY  07/16/2017   Procedure: POLYPECTOMY;  Surgeon: Danie Binder, MD;  Location: [facility];  Service: Endoscopy;;  cecal x2;hepatic flexurex5; splenic x1;descending x2  . VIDEO BRONCHOSCOPY WITH ENDOBRONCHIAL ULTRASOUND N/A 12/17/2017   Procedure: VIDEO BRONCHOSCOPY WITH ENDOBRONCHIAL ULTRASOUND;  Surgeon: Melrose Nakayama, MD;  Location: [facility] OR;  Service: Thoracic;  Laterality: N/A;    Family  History Family History  Problem Relation Age of Onset  . Hypertension Mother   . Hypertension Father   . Colon cancer Neg Hx   . Colon polyps Neg Hx      Social History  reports that she quit smoking about 5 years ago. Her smoking use included cigarettes. She has a 40.00 pack-year smoking history. She has never used smokeless tobacco. She reports that she does not drink alcohol or use drugs.  Medications  Current Outpatient Medications:  .  albuterol (PROVENTIL HFA;VENTOLIN HFA) 108 (90 Base) MCG/ACT inhaler, Inhale 2 puffs into the lungs every 4 (four) hours as needed for wheezing or shortness of breath (or coughing)., Disp: 1 Inhaler, Rfl: 0 .  amLODipine (NORVASC) 5 MG tablet, Take 5 mg by mouth every evening. , Disp: , Rfl:  .  aspirin EC 81 MG tablet, Take 81 mg by mouth every evening. , Disp: , Rfl:  .  benzonatate (TESSALON) 100 MG capsule, Take 1 capsule (100 mg total) by mouth 3 (three) times daily as needed for cough., Disp: 15 capsule, Rfl: 0 .  CARBOPLATIN IV, Inject into the vein every 21 ( twenty-one) days. , Disp: , Rfl:  .  dexamethasone (DECADRON) 4 MG tablet, Take 1 tab two times a day the day before Alimta chemo, then take 2 tabs once a day for 3 days starting the day after chemo., Disp: 30 tablet, Rfl: 1 .  folic acid (FOLVITE) 1 MG tablet, Take 1 tablet (1 mg total) by mouth daily. Start 5-7 days before Alimta chemotherapy. Continue until 21 days after Alimta completed., Disp: 100 tablet, Rfl: 3 .  HYDROcodone-acetaminophen (NORCO/VICODIN) 5-325 MG tablet, 1 or 2 tabs PO q12 hours prn pain (Patient taking differently: Take 1-2 tablets by mouth every 12 (twelve) hours as needed (pain.). ), Disp: 60 tablet, Rfl: 0 .  lidocaine-prilocaine (EMLA) cream, Apply to affected area once, Disp: 30 g, Rfl: 3 .  lisinopril (PRINIVIL,ZESTRIL) 5 MG tablet, Take 5 mg by mouth every evening. , Disp: , Rfl:  .  Pembrolizumab (KEYTRUDA IV), Inject 200 mg into the vein every 21 (  twenty-one) days., Disp: , Rfl:  .  PEMEtrexed Disodium (ALIMTA IV), Inject 875 mg into the vein every 21 ( twenty-one) days., Disp: , Rfl:  .  prochlorperazine (COMPAZINE) 10 MG tablet, Take 1 tablet (10 mg total) by mouth every 6 (six) hours as needed (Nausea or vomiting)., Disp: 30 tablet, Rfl: 1 .  simvastatin (ZOCOR) 40 MG tablet, Take 40 mg by mouth every evening. , Disp: , Rfl:  No current facility-administered medications for this visit.   Facility-Administered Medications Ordered in Other Visits:  .  CARBOplatin (PARAPLATIN) 360 mg in sodium chloride 0.9 % 250 mL chemo infusion, 360 mg, Intravenous, Once, Shyna Duignan, MD .  fosaprepitant (EMEND) 150 mg, dexamethasone (DECADRON)  12 mg in sodium chloride 0.9 % 145 mL IVPB, , Intravenous, Once, Ottilia Pippenger, MD, Last Rate: 454 mL/hr at 01/20/18 0938 .  heparin lock flush 100 unit/mL, 500 Units, Intracatheter, Once PRN, Kaysi Ourada, Mathis Dad, MD .  pembrolizumab (KEYTRUDA) 200 mg in sodium chloride 0.9 % 50 mL chemo infusion, 200 mg, Intravenous, Once, Jaeger Trueheart, Mathis Dad, MD .  PEMEtrexed (ALIMTA) 900 mg in sodium chloride 0.9 % 100 mL chemo infusion, 900 mg, Intravenous, Once, Zema Lizardo, MD .  sodium chloride flush (NS) 0.9 % injection 10 mL, 10 mL, Intracatheter, PRN, Madalina Rosman, MD, 10 mL at 01/20/18 6948  Allergies Codeine  Review of Systems Review of Systems - Oncology ROS negative chest wall discomfort.     Physical Exam  Vitals Wt Readings from Last 3 Encounters:  01/20/18 131 lb 12.8 oz (59.8 kg)  12/29/17 135 lb 6.4 oz (61.4 kg)  12/24/17 141 lb (64 kg)   Temp Readings from Last 3 Encounters:  01/20/18 98.3 F (36.8 C) (Oral)  12/29/17 (!) 97.5 F (36.4 C) (Oral)  12/24/17 97.9 F (36.6 C) (Oral)   BP Readings from Last 3 Encounters:  01/20/18 (!) 160/58  12/29/17 (!) 162/60  12/24/17 (!) 185/54   Pulse Readings from Last 3 Encounters:  01/20/18 64  12/29/17 70  12/24/17 64   Constitutional: Well-developed,  well-nourished, and in no distress.   HENT: Head: Normocephalic and atraumatic.  Mouth/Throat: No oropharyngeal exudate. Mucosa moist. Eyes: Pupils are equal, round, and reactive to light. Conjunctivae are normal. No scleral icterus.  Neck: Normal range of motion. Neck supple. No JVD present.  Cardiovascular: Normal rate, regular rhythm and normal heart sounds.  Exam reveals no gallop and no friction rub.   No murmur heard. Pulmonary/Chest: Effort normal and breath sounds normal. No respiratory distress. No wheezes.No rales.  Abdominal: Soft. Bowel sounds are normal. No distension. There is no tenderness. There is no guarding.  Musculoskeletal: No edema or tenderness.  Lymphadenopathy: No cervical, axillary or supraclavicular adenopathy.  Neurological: Alert and oriented to person, place, and time. No cranial nerve deficit.  Skin: Skin is warm and dry. No rash noted. No erythema. No pallor.  Psychiatric: Affect and judgment normal.   Labs Appointment on 01/20/2018  Component Date Value Ref Range Status  . WBC 01/20/2018 3.9* 4.0 - 10.5 K/uL Final  . RBC 01/20/2018 4.77  3.87 - 5.11 MIL/uL Final  . Hemoglobin 01/20/2018 11.9* 12.0 - 15.0 g/dL Final  . HCT 01/20/2018 40.4  36.0 - 46.0 % Final  . MCV 01/20/2018 84.7  80.0 - 100.0 fL Final  . MCH 01/20/2018 24.9* 26.0 - 34.0 pg Final  . MCHC 01/20/2018 29.5* 30.0 - 36.0 g/dL Final  . RDW 01/20/2018 13.7  11.5 - 15.5 % Final  . Platelets 01/20/2018 330  150 - 400 K/uL Final  . nRBC 01/20/2018 0.0  0.0 - 0.2 % Final  . Neutrophils Relative % 01/20/2018 65  % Final  . Neutro Abs 01/20/2018 2.6  1.7 - 7.7 K/uL Final  . Lymphocytes Relative 01/20/2018 16  % Final  . Lymphs Abs 01/20/2018 0.6* 0.7 - 4.0 K/uL Final  . Monocytes Relative 01/20/2018 17  % Final  . Monocytes Absolute 01/20/2018 0.7  0.1 - 1.0 K/uL Final  . Eosinophils Relative 01/20/2018 1  % Final  . Eosinophils Absolute 01/20/2018 0.0  0.0 - 0.5 K/uL Final  . Basophils  Relative 01/20/2018 0  % Final  . Basophils Absolute 01/20/2018 0.0  0.0 - 0.1  K/uL Final  . Immature Granulocytes 01/20/2018 1  % Final  . Abs Immature Granulocytes 01/20/2018 0.02  0.00 - 0.07 K/uL Final   Performed at [facility], [address], [place], [place] [address]  . Sodium 01/20/2018 138  135 - 145 mmol/L Final  . Potassium 01/20/2018 3.6  3.5 - 5.1 mmol/L Final  . Chloride 01/20/2018 106  98 - 111 mmol/L Final  . CO2 01/20/2018 24  22 - 32 mmol/L Final  . Glucose, Bld 01/20/2018 110* 70 - 99 mg/dL Final  . BUN 01/20/2018 11  8 - 23 mg/dL Final  . Creatinine, Ser 01/20/2018 0.53  0.44 - 1.00 mg/dL Final  . Calcium 01/20/2018 9.0  8.9 - 10.3 mg/dL Final  . Total Protein 01/20/2018 7.0  6.5 - 8.1 g/dL Final  . Albumin 01/20/2018 3.4* 3.5 - 5.0 g/dL Final  . AST 01/20/2018 15  15 - 41 U/L Final  . ALT 01/20/2018 10  0 - 44 U/L Final  . Alkaline Phosphatase 01/20/2018 52  38 - 126 U/L Final  . Total Bilirubin 01/20/2018 0.9  0.3 - 1.2 mg/dL Final  . GFR calc non Af Amer 01/20/2018 >60  >60 mL/min Final  . GFR calc Af Amer 01/20/2018 >60  >60 mL/min Final  . Anion gap 01/20/2018 8  5 - 15 Final   Performed at [facility], [address], [place], [place] [address]     Pathology Orders Placed This Encounter  Procedures  . CBC with Differential/Platelet    Standing Status:   Future    Standing Expiration Date:   01/21/2020  . Comprehensive metabolic panel    Standing Status:   Future    Standing Expiration Date:   01/21/2020  . Lactate dehydrogenase    Standing Status:   Future    Standing Expiration Date:   01/21/2020  . T4 AND TSH    Standing Status:   Future    Standing Expiration Date:   01/21/2020       Zoila Shutter MD

## 2018-01-27 ENCOUNTER — Other Ambulatory Visit: Payer: Self-pay | Admitting: Radiation Therapy

## 2018-01-27 DIAGNOSIS — D329 Benign neoplasm of meninges, unspecified: Secondary | ICD-10-CM

## 2018-01-30 ENCOUNTER — Telehealth: Payer: Self-pay | Admitting: Radiation Therapy

## 2018-01-30 NOTE — Telephone Encounter (Signed)
Spoke with Ms. Claudio about her upcoming brain MRI on 1/24. I explained that we will review her scan and Bryson Ha will call her with the results. If this is a meningioma, she will need to see Dr. Mickeal Skinner for future follow-up. She understood and was thankful for the call.   Mont Dutton R.T.(R)(T) Special Procedures Navigator

## 2018-02-04 DIAGNOSIS — C3490 Malignant neoplasm of unspecified part of unspecified bronchus or lung: Secondary | ICD-10-CM | POA: Diagnosis not present

## 2018-02-10 ENCOUNTER — Inpatient Hospital Stay (HOSPITAL_COMMUNITY): Payer: Medicare HMO

## 2018-02-10 ENCOUNTER — Inpatient Hospital Stay (HOSPITAL_COMMUNITY): Payer: Medicare HMO | Admitting: Internal Medicine

## 2018-02-10 ENCOUNTER — Inpatient Hospital Stay (HOSPITAL_COMMUNITY): Payer: Medicare HMO | Attending: Hematology

## 2018-02-10 ENCOUNTER — Other Ambulatory Visit: Payer: Self-pay

## 2018-02-10 ENCOUNTER — Encounter (HOSPITAL_COMMUNITY): Payer: Self-pay | Admitting: Internal Medicine

## 2018-02-10 VITALS — BP 147/66 | HR 64 | Temp 97.6°F | Resp 16 | Wt 129.4 lb

## 2018-02-10 VITALS — BP 164/54 | HR 63 | Temp 97.8°F | Resp 18

## 2018-02-10 DIAGNOSIS — Z5111 Encounter for antineoplastic chemotherapy: Secondary | ICD-10-CM

## 2018-02-10 DIAGNOSIS — R0789 Other chest pain: Secondary | ICD-10-CM

## 2018-02-10 DIAGNOSIS — Z79899 Other long term (current) drug therapy: Secondary | ICD-10-CM | POA: Diagnosis not present

## 2018-02-10 DIAGNOSIS — J439 Emphysema, unspecified: Secondary | ICD-10-CM | POA: Insufficient documentation

## 2018-02-10 DIAGNOSIS — Z87891 Personal history of nicotine dependence: Secondary | ICD-10-CM

## 2018-02-10 DIAGNOSIS — Z5112 Encounter for antineoplastic immunotherapy: Secondary | ICD-10-CM | POA: Diagnosis not present

## 2018-02-10 DIAGNOSIS — C3480 Malignant neoplasm of overlapping sites of unspecified bronchus and lung: Secondary | ICD-10-CM

## 2018-02-10 DIAGNOSIS — R590 Localized enlarged lymph nodes: Secondary | ICD-10-CM | POA: Diagnosis not present

## 2018-02-10 DIAGNOSIS — R634 Abnormal weight loss: Secondary | ICD-10-CM | POA: Diagnosis not present

## 2018-02-10 DIAGNOSIS — I10 Essential (primary) hypertension: Secondary | ICD-10-CM | POA: Diagnosis not present

## 2018-02-10 DIAGNOSIS — G939 Disorder of brain, unspecified: Secondary | ICD-10-CM | POA: Diagnosis not present

## 2018-02-10 DIAGNOSIS — E041 Nontoxic single thyroid nodule: Secondary | ICD-10-CM | POA: Diagnosis not present

## 2018-02-10 DIAGNOSIS — I700 Atherosclerosis of aorta: Secondary | ICD-10-CM

## 2018-02-10 DIAGNOSIS — E785 Hyperlipidemia, unspecified: Secondary | ICD-10-CM

## 2018-02-10 DIAGNOSIS — Z7982 Long term (current) use of aspirin: Secondary | ICD-10-CM

## 2018-02-10 DIAGNOSIS — C3482 Malignant neoplasm of overlapping sites of left bronchus and lung: Secondary | ICD-10-CM | POA: Insufficient documentation

## 2018-02-10 LAB — CBC WITH DIFFERENTIAL/PLATELET
ABS IMMATURE GRANULOCYTES: 0.01 10*3/uL (ref 0.00–0.07)
Basophils Absolute: 0 10*3/uL (ref 0.0–0.1)
Basophils Relative: 0 %
Eosinophils Absolute: 0 10*3/uL (ref 0.0–0.5)
Eosinophils Relative: 0 %
HCT: 38.8 % (ref 36.0–46.0)
Hemoglobin: 11.3 g/dL — ABNORMAL LOW (ref 12.0–15.0)
Immature Granulocytes: 0 %
LYMPHS ABS: 0.6 10*3/uL — AB (ref 0.7–4.0)
Lymphocytes Relative: 14 %
MCH: 24.2 pg — ABNORMAL LOW (ref 26.0–34.0)
MCHC: 29.1 g/dL — ABNORMAL LOW (ref 30.0–36.0)
MCV: 83.3 fL (ref 80.0–100.0)
MONOS PCT: 9 %
Monocytes Absolute: 0.3 10*3/uL (ref 0.1–1.0)
Neutro Abs: 3 10*3/uL (ref 1.7–7.7)
Neutrophils Relative %: 77 %
Platelets: 375 10*3/uL (ref 150–400)
RBC: 4.66 MIL/uL (ref 3.87–5.11)
RDW: 15.1 % (ref 11.5–15.5)
WBC: 3.9 10*3/uL — ABNORMAL LOW (ref 4.0–10.5)
nRBC: 0 % (ref 0.0–0.2)

## 2018-02-10 LAB — COMPREHENSIVE METABOLIC PANEL
ALBUMIN: 3.7 g/dL (ref 3.5–5.0)
ALT: 9 U/L (ref 0–44)
ANION GAP: 8 (ref 5–15)
AST: 18 U/L (ref 15–41)
Alkaline Phosphatase: 51 U/L (ref 38–126)
BUN: 11 mg/dL (ref 8–23)
CO2: 24 mmol/L (ref 22–32)
Calcium: 9.9 mg/dL (ref 8.9–10.3)
Chloride: 102 mmol/L (ref 98–111)
Creatinine, Ser: 0.53 mg/dL (ref 0.44–1.00)
GFR calc Af Amer: >60 mL/min (ref >60)
GFR calc non Af Amer: >60 mL/min (ref >60)
GLUCOSE: 139 mg/dL — AB (ref 70–99)
Potassium: 3.9 mmol/L (ref 3.5–5.1)
Sodium: 134 mmol/L — ABNORMAL LOW (ref 135–145)
Total Bilirubin: 0.6 mg/dL (ref 0.3–1.2)
Total Protein: 7.8 g/dL (ref 6.5–8.1)

## 2018-02-10 LAB — TSH: TSH: 0.434 u[IU]/mL (ref 0.350–4.500)

## 2018-02-10 MED ORDER — CYANOCOBALAMIN 1000 MCG/ML IJ SOLN
1000.0000 ug | Freq: Once | INTRAMUSCULAR | Status: AC
  Administered 2018-02-10: 1000 ug via INTRAMUSCULAR
  Filled 2018-02-10: qty 1

## 2018-02-10 MED ORDER — SODIUM CHLORIDE 0.9% FLUSH
10.0000 mL | INTRAVENOUS | Status: DC | PRN
  Administered 2018-02-10: 10 mL
  Filled 2018-02-10: qty 10

## 2018-02-10 MED ORDER — HEPARIN SOD (PORK) LOCK FLUSH 100 UNIT/ML IV SOLN
500.0000 [IU] | Freq: Once | INTRAVENOUS | Status: AC | PRN
  Administered 2018-02-10: 500 [IU]
  Filled 2018-02-10: qty 5

## 2018-02-10 MED ORDER — SODIUM CHLORIDE 0.9 % IV SOLN
200.0000 mg | Freq: Once | INTRAVENOUS | Status: AC
  Administered 2018-02-10: 200 mg via INTRAVENOUS
  Filled 2018-02-10: qty 8

## 2018-02-10 MED ORDER — SODIUM CHLORIDE 0.9 % IV SOLN
Freq: Once | INTRAVENOUS | Status: AC
  Administered 2018-02-10: 10:00:00 via INTRAVENOUS
  Filled 2018-02-10: qty 5

## 2018-02-10 MED ORDER — SODIUM CHLORIDE 0.9 % IV SOLN
Freq: Once | INTRAVENOUS | Status: AC
  Administered 2018-02-10: 10:00:00 via INTRAVENOUS

## 2018-02-10 MED ORDER — PALONOSETRON HCL INJECTION 0.25 MG/5ML
0.2500 mg | Freq: Once | INTRAVENOUS | Status: AC
  Administered 2018-02-10: 0.25 mg via INTRAVENOUS
  Filled 2018-02-10: qty 5

## 2018-02-10 MED ORDER — SODIUM CHLORIDE 0.9 % IV SOLN
356.5000 mg | Freq: Once | INTRAVENOUS | Status: AC
  Administered 2018-02-10: 360 mg via INTRAVENOUS
  Filled 2018-02-10: qty 36

## 2018-02-10 MED ORDER — SODIUM CHLORIDE 0.9 % IV SOLN
515.0000 mg/m2 | Freq: Once | INTRAVENOUS | Status: AC
  Administered 2018-02-10: 900 mg via INTRAVENOUS
  Filled 2018-02-10: qty 16

## 2018-02-10 NOTE — Patient Instructions (Signed)
[facility] Discharge Instructions for Patients Receiving Chemotherapy   Beginning January 23rd 2017 lab work for the [facility] will be done in the  Main lab at [facility] on 1st floor. If you have a lab appointment with the [facility] please come in thru the  Main Entrance and check in at the main information desk   Today you received the following chemotherapy agents Carboplatin,Keytruda and Alimta as well as Vit B12 injection. Follow-up as scheduled. Call clinic for any questions or concerns  To help prevent nausea and vomiting after your treatment, we encourage you to take your nausea medication   If you develop nausea and vomiting, or diarrhea that is not controlled by your medication, call the clinic.  The clinic phone number is [phone]. Office hours are Monday-Friday 8:30am-5:00pm.  BELOW ARE SYMPTOMS THAT SHOULD BE REPORTED IMMEDIATELY:  *FEVER GREATER THAN 101.0 F  *CHILLS WITH OR WITHOUT FEVER  NAUSEA AND VOMITING THAT IS NOT CONTROLLED WITH YOUR NAUSEA MEDICATION  *UNUSUAL SHORTNESS OF BREATH  *UNUSUAL BRUISING OR BLEEDING  TENDERNESS IN MOUTH AND THROAT WITH OR WITHOUT PRESENCE OF ULCERS  *URINARY PROBLEMS  *BOWEL PROBLEMS  UNUSUAL RASH Items with * indicate a potential emergency and should be followed up as soon as possible. If you have an emergency after office hours please contact your primary care physician or go to the nearest emergency department.  Please call the clinic during office hours if you have any questions or concerns.   You may also contact the Patient Navigator at [phone] should you have any questions or need assistance in obtaining follow up care.      Resources For Cancer Patients and their Caregivers ? American Cancer Society: Can assist with transportation, wigs, general needs, runs Look Good Feel Better.        734-781-1011 ? Cancer Care: Provides financial assistance, online support groups,  medication/co-pay assistance.  1-800-813-HOPE 743-721-5868) ? [facility] [facility] Assists [place] cancer patients and their families through emotional , educational and financial support.  (959)704-9200 ? [place] DSS Where to apply for food stamps, Medicaid and utility assistance. (870)285-6408 ? [facility]: Transportation to medical appointments. 7343007316 ? Social Security Administration: May apply for disability if have a Stage IV cancer. 8677573856 747-146-0085 ? [employer], Disability and Transit Services: Assists with nutrition, care and transit needs. (607)697-0182

## 2018-02-10 NOTE — Progress Notes (Signed)
Diagnosis Malignant neoplasm of overlapping sites of lung, unspecified laterality ([place]) - Plan: CBC with Differential/Platelet, Comprehensive metabolic panel, Lactate dehydrogenase, T4 AND TSH, DISCONTINUED: sodium chloride flush (NS) 0.9 % injection 10 mL, DISCONTINUED: heparin lock flush 100 unit/mL, DISCONTINUED: 0.9 %  sodium chloride infusion, DISCONTINUED: palonosetron (ALOXI) injection 0.25 mg, DISCONTINUED: fosaprepitant (EMEND) 150 mg, dexamethasone (DECADRON) 12 mg in sodium chloride 0.9 % 145 mL IVPB, DISCONTINUED: cyanocobalamin ((VITAMIN B-12)) injection 1,000 mcg, DISCONTINUED: pembrolizumab (KEYTRUDA) 200 mg in sodium chloride 0.9 % 50 mL chemo infusion, DISCONTINUED: PEMEtrexed (ALIMTA) 875 mg in sodium chloride 0.9 % 100 mL chemo infusion, DISCONTINUED: CARBOplatin (PARAPLATIN) 360 mg in sodium chloride 0.9 % 100 mL chemo infusion  Staging Cancer Staging No matching staging information was found for the patient.  Assessment and Plan:   1.  Stage IV NSCLC.  Pt had CTA of the chest on 11/16/2017 with findings of  IMPRESSION: 1. 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 There are multiple adjacent nodules within the left upper lobe as well as scattered nodules throughout the lungs bilaterally concerning for metastatic disease. Left hilar adenopathy concerning for metastatic disease. 2. Indeterminate left adrenal nodule. 3. No evidence for acute pulmonary embolus. 4. Aortic Atherosclerosis (ICD10-I70.0) and Emphysema (ICD10-J43.9).  She reports weight loss of 10 pounds over past month.    Pet scan done 12/01/2017 reviewed and showed   IMPRESSION: 1. Hypermetabolic LEFT hilar mass which encroaches upon the AP window. 2. Nodularity in the anterior LEFT upper lobe and superior LEFT upper lobe consistent with primary or metastatic lung cancer. 3. Hypermetabolic metastatic pulmonary nodule within the RIGHT upper lobe. 4. Hypometabolism of the LEFT  focal cord suggest paralysis and involvement of the LEFT recurrent laryngeal nerve by the AP window tumor (correlate with hoarseness). 5. 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 6. No evidence of metastatic lung carcinoma outside the thorax.  MRI of brain done 12/01/2017 reviewed and showed  IMPRESSION: 1. 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 2. No other enhancing lesions to suggest metastatic disease. 3. No acute intracranial abnormality. 4. Mild chronic small vessel ischemic disease. 5. Chronic cerebellar infarcts.  Pt had biopsy done of LUL on 12/17/2017 with pathology returning as adenocarcinoma.  EBUS of level 11 node returned as NSCLC.    Pt has diagnosis of advanced NSCLC.  She is being treated with standard recommendation based on adenocarcinoma histology of Pembro dosed at 200 mg IV with Carboplatin AUC 5 with Alimta 500 mg/m2 every 3 weeks based on results of [place] study that showed an increase in PFS and overall survival with the addition of Keytruda to chemotherapy in lung cancer. Pt will be treated with 4 cycles of therapy with repeat imaging.  Continuation of  Alimta and Pembro for maintenance therapy pending response.  Pt should continue J24 and folic acid as directed.  Foundation testing was unable to be done due to insufficient sample for analysis. Will discuss with pathology.    Pt here for evaluation prior to C3.  Labs done 02/10/2018 reviewed and showed WBC 3.9 HB 11.3 plts 375,000.  Chemistries WNL with K+ 3.9 Cr 0.53 and normal LFTs.  TSH WNL at 0.434.  Pt will RTC in 3 weeks for evaluation prior to C4.    2.  Brain lesion.  MRI of brain done due to advanced findings suspicious for malignancy on CTA done 12/01/2017 and was reviewed  and showed impression:   1. 6 mm posterior right frontal extra-axial mass most likelyreflecting an incidental  meningioma.  Pt has been seen by RT for evaluation based on the brain lesion noted on MRI and due to PET findings.  Based on RT revew, the patient has findings consistent with multiple pulmonary metastases.  She also had a 0.6 cm extra-axial mass found on brain MRI scan.  This finding appears to represent a meningioma.  After reviewing this study, Dr. Lisbeth Renshaw is not concerned that this represents a metastasis but feels it is reasonable to obtain a repeat MRI scan in about 8 weeks for follow-up.   If this remains stable as expected, then further brain imaging could be spaced out further.     3.  HTN.  BP is 147/66.  Follow-up with PCP.    4.  Chest wall discomfort.  Pt reports this is improved.  CTA negative for PE.  Likely due to lung findings on CTA.  Pt previously prescribed Vicodin.    25 minutes spent with review of records, counseling and coordination of care.    Interval History:  Historical data obtained from note dated 11/18/2017:  78 yr old female presented to ED due to shortness of breath.  She has a long smoking history.  She underwent CTA of the chest on 11/16/2017 with findings of  IMPRESSION: 1. 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 There are multiple adjacent nodules within the left upper lobe as well as scattered nodules throughout the lungs bilaterally concerning for metastatic disease. Left hilar adenopathy concerning for metastatic disease. 2. Indeterminate left adrenal nodule. 3. No evidence for acute pulmonary embolus. 4. Aortic Atherosclerosis (ICD10-I70.0) and Emphysema (ICD10-J43.9).  Current Status:  Pt is seen today for follow-up.  She is here for evaluation prior to C3 of Pembro, Carboplatin and Alimta.       Lung cancer ([place])   12/23/2017 Initial Diagnosis    Lung cancer ([place])    12/29/2017 -  Chemotherapy    The patient had palonosetron (ALOXI) injection 0.25 mg, 0.25 mg, Intravenous,  Once, 3 of 6 cycles Administration: 0.25  mg (12/29/2017), 0.25 mg (01/20/2018) PEMEtrexed (ALIMTA) 900 mg in sodium chloride 0.9 % 100 mL chemo infusion, 515 mg/m2 = 875 mg, Intravenous,  Once, 3 of 6 cycles Administration: 900 mg (12/29/2017), 900 mg (01/20/2018) CARBOplatin (PARAPLATIN) 360 mg in sodium chloride 0.9 % 250 mL chemo infusion, 360 mg (100 % of original dose 356.5 mg), Intravenous,  Once, 3 of 6 cycles Dose modification:   (original dose 356.5 mg, Cycle 1),   (original dose 356.5 mg, Cycle 3),   (original dose 356.5 mg, Cycle 2) Administration: 360 mg (12/29/2017), 360 mg (01/20/2018) pembrolizumab (KEYTRUDA) 200 mg in sodium chloride 0.9 % 50 mL chemo infusion, 200 mg, Intravenous, Once, 3 of 6 cycles Administration: 200 mg (12/29/2017), 200 mg (01/20/2018) fosaprepitant (EMEND) 150 mg, dexamethasone (DECADRON) 12 mg in sodium chloride 0.9 % 145 mL IVPB, , Intravenous,  Once, 3 of 6 cycles Administration:  (12/29/2017),  (01/20/2018)  for chemotherapy treatment.       Problem List Patient Active Problem List   Diagnosis Date Noted  . Lung cancer ([place]) [C34.90] 12/23/2017  . Special screening for malignant neoplasms, colon [Z12.11]     Past Medical History Past Medical History:  Diagnosis Date  . Anemia   . Borderline diabetes   . Dyspnea   . Hyperlipidemia   . Hypertension   . Lung cancer ([place])  12/23/2017  . Pneumonia   . Pre-diabetes     Past Surgical History Past Surgical History:  Procedure Laterality Date  . ABDOMINAL HYSTERECTOMY    . APPENDECTOMY    . COLONOSCOPY N/A 07/16/2017   Procedure: COLONOSCOPY;  Surgeon: Danie Binder, MD;  Location[facility];  Service: Endoscopy;  Laterality: N/A;  10:30  . IR IMAGING GUIDED PORT INSERTION  12/24/2017  . POLYPECTOMY  07/16/2017   Procedure: POLYPECTOMY;  Surgeon: Danie Binder, MD;  Location: [facility];  Service: Endoscopy;;  cecal x2;hepatic flexurex5; splenic x1;descending x2  . VIDEO BRONCHOSCOPY WITH ENDOBRONCHIAL ULTRASOUND N/A  12/17/2017   Procedure: VIDEO BRONCHOSCOPY WITH ENDOBRONCHIAL ULTRASOUND;  Surgeon: Melrose Nakayama, MD;  [facility];  Service: Thoracic;  Laterality: N/A;    Family History Family History  Problem Relation Age of Onset  . Hypertension Mother   . Hypertension Father   . Colon cancer Neg Hx   . Colon polyps Neg Hx      Social History  reports that she quit smoking about 5 years ago. Her smoking use included cigarettes. She has a 40.00 pack-year smoking history. She has never used smokeless tobacco. She reports that she does not drink alcohol or use drugs.  Medications  Current Outpatient Medications:  .  amLODipine (NORVASC) 5 MG tablet, Take 5 mg by mouth every evening. , Disp: , Rfl:  .  aspirin EC 81 MG tablet, Take 81 mg by mouth every evening. , Disp: , Rfl:  .  CARBOPLATIN IV, Inject into the vein every 21 ( twenty-one) days. , Disp: , Rfl:  .  dexamethasone (DECADRON) 4 MG tablet, Take 1 tab two times a day the day before Alimta chemo, then take 2 tabs once a day for 3 days starting the day after chemo., Disp: 30 tablet, Rfl: 1 .  folic acid (FOLVITE) 1 MG tablet, Take 1 tablet (1 mg total) by mouth daily. Start 5-7 days before Alimta chemotherapy. Continue until 21 days after Alimta completed., Disp: 100 tablet, Rfl: 3 .  lidocaine-prilocaine (EMLA) cream, Apply to affected area once, Disp: 30 g, Rfl: 3 .  lisinopril (PRINIVIL,ZESTRIL) 5 MG tablet, Take 5 mg by mouth every evening. , Disp: , Rfl:  .  Pembrolizumab (KEYTRUDA IV), Inject 200 mg into the vein every 21 ( twenty-one) days., Disp: , Rfl:  .  PEMEtrexed Disodium (ALIMTA IV), Inject 875 mg into the vein every 21 ( twenty-one) days., Disp: , Rfl:  .  simvastatin (ZOCOR) 40 MG tablet, Take 40 mg by mouth every evening. , Disp: , Rfl:  .  albuterol (PROVENTIL HFA;VENTOLIN HFA) 108 (90 Base) MCG/ACT inhaler, Inhale 2 puffs into the lungs every 4 (four) hours as needed for wheezing or shortness of breath (or  coughing). (Patient not taking: Reported on 02/10/2018), Disp: 1 Inhaler, Rfl: 0 .  benzonatate (TESSALON) 100 MG capsule, Take 1 capsule (100 mg total) by mouth 3 (three) times daily as needed for cough. (Patient not taking: Reported on 02/10/2018), Disp: 15 capsule, Rfl: 0 .  HYDROcodone-acetaminophen (NORCO/VICODIN) 5-325 MG tablet, Take 1 tablet by mouth every 4 (four) hours as needed for moderate pain. 1 or 2 tabs PO q4 hours prn pain (Patient not taking: Reported on 02/10/2018), Disp: 90 tablet, Rfl: 0 .  prochlorperazine (COMPAZINE) 10 MG tablet, Take 1 tablet (10 mg total) by mouth every 6 (six) hours as needed (Nausea or vomiting). (Patient not taking: Reported on 02/10/2018), Disp: 30 tablet, Rfl: 1  No current facility-administered medications for this visit.   Facility-Administered Medications Ordered in Other Visits:  .  CARBOplatin (PARAPLATIN) 360 mg in sodium chloride 0.9 % 250 mL chemo infusion, 360 mg, Intravenous, Once, Charlette Hennings, MD .  cyanocobalamin ((VITAMIN B-12)) injection 1,000 mcg, 1,000 mcg, Intramuscular, Once, Dennisse Swader, MD .  heparin lock flush 100 unit/mL, 500 Units, Intracatheter, Once PRN, Spyros Winch, Mathis Dad, MD .  pembrolizumab (KEYTRUDA) 200 mg in sodium chloride 0.9 % 50 mL chemo infusion, 200 mg, Intravenous, Once, Cookie Pore, MD, Last Rate: 116 mL/hr at 02/10/18 1105, 200 mg at 02/10/18 1105 .  PEMEtrexed (ALIMTA) 900 mg in sodium chloride 0.9 % 100 mL chemo infusion, 515 mg/m2 (Treatment Plan Recorded), Intravenous, Once, Anye Brose, MD .  sodium chloride flush (NS) 0.9 % injection 10 mL, 10 mL, Intracatheter, PRN, Favian Kittleson, MD, 10 mL at 02/10/18 0925  Allergies Codeine  Review of Systems Review of Systems - Oncology ROS negative   Physical Exam  Vitals Wt Readings from Last 3 Encounters:  02/10/18 129 lb 7 oz (58.7 kg)  01/20/18 131 lb 12.8 oz (59.8 kg)  12/29/17 135 lb 6.4 oz (61.4 kg)   Temp Readings from Last 3 Encounters:  02/10/18 97.6 F  (36.4 C) (Oral)  01/20/18 97.8 F (36.6 C) (Oral)  01/20/18 98.3 F (36.8 C) (Oral)   BP Readings from Last 3 Encounters:  02/10/18 (!) 147/66  01/20/18 (!) 150/70  01/20/18 (!) 160/58   Pulse Readings from Last 3 Encounters:  02/10/18 64  01/20/18 60  01/20/18 64   Constitutional: Well-developed, well-nourished, and in no distress.   HENT: Head: Normocephalic and atraumatic.  Mouth/Throat: No oropharyngeal exudate. Mucosa moist. Eyes: Pupils are equal, round, and reactive to light. Conjunctivae are normal. No scleral icterus.  Neck: Normal range of motion. Neck supple. No JVD present.  Cardiovascular: Normal rate, regular rhythm and normal heart sounds.  Exam reveals no gallop and no friction rub.   No murmur heard. Pulmonary/Chest: Effort normal and breath sounds normal. No respiratory distress. No wheezes.No rales.  Abdominal: Soft. Bowel sounds are normal. No distension. There is no tenderness. There is no guarding.  Musculoskeletal: No edema or tenderness.  Lymphadenopathy: No cervical, axillary or supraclavicular adenopathy.  Neurological: Alert and oriented to person, place, and time. No cranial nerve deficit.  Skin: Skin is warm and dry. No rash noted. No erythema. No pallor.  Psychiatric: Affect and judgment normal.   Labs Appointment on 02/10/2018  Component Date Value Ref Range Status  . TSH 02/10/2018 0.434  0.350 - 4.500 uIU/mL Final   Comment: Performed by a 3rd Generation assay with a functional sensitivity of <=0.01 uIU/mL. Performed at [facility], [address], [place],  [address]   . WBC 02/10/2018 3.9* 4.0 - 10.5 K/uL Final  . RBC 02/10/2018 4.66  3.87 - 5.11 MIL/uL Final  . Hemoglobin 02/10/2018 11.3* 12.0 - 15.0 g/dL Final  . HCT 02/10/2018 38.8  36.0 - 46.0 % Final  . MCV 02/10/2018 83.3  80.0 - 100.0 fL Final  . MCH 02/10/2018 24.2* 26.0 - 34.0 pg Final  . MCHC 02/10/2018 29.1* 30.0 - 36.0 g/dL Final  . RDW 02/10/2018 15.1  11.5 - 15.5 %  Final  . Platelets 02/10/2018 375  150 - 400 K/uL Final  . nRBC 02/10/2018 0.0  0.0 - 0.2 % Final  . Neutrophils Relative % 02/10/2018 77  % Final  . Neutro Abs 02/10/2018 3.0  1.7 - 7.7 K/uL Final  .  Lymphocytes Relative 02/10/2018 14  % Final  . Lymphs Abs 02/10/2018 0.6* 0.7 - 4.0 K/uL Final  . Monocytes Relative 02/10/2018 9  % Final  . Monocytes Absolute 02/10/2018 0.3  0.1 - 1.0 K/uL Final  . Eosinophils Relative 02/10/2018 0  % Final  . Eosinophils Absolute 02/10/2018 0.0  0.0 - 0.5 K/uL Final  . Basophils Relative 02/10/2018 0  % Final  . Basophils Absolute 02/10/2018 0.0  0.0 - 0.1 K/uL Final  . Immature Granulocytes 02/10/2018 0  % Final  . Abs Immature Granulocytes 02/10/2018 0.01  0.00 - 0.07 K/uL Final   Performed at [facility], [address] [place], [place] [address]  . Sodium 02/10/2018 134* 135 - 145 mmol/L Final  . Potassium 02/10/2018 3.9  3.5 - 5.1 mmol/L Final  . Chloride 02/10/2018 102  98 - 111 mmol/L Final  . CO2 02/10/2018 24  22 - 32 mmol/L Final  . Glucose, Bld 02/10/2018 139* 70 - 99 mg/dL Final  . BUN 02/10/2018 11  8 - 23 mg/dL Final  . Creatinine, Ser 02/10/2018 0.53  0.44 - 1.00 mg/dL Final  . Calcium 02/10/2018 9.9  8.9 - 10.3 mg/dL Final  . Total Protein 02/10/2018 7.8  6.5 - 8.1 g/dL Final  . Albumin 02/10/2018 3.7  3.5 - 5.0 g/dL Final  . AST 02/10/2018 18  15 - 41 U/L Final  . ALT 02/10/2018 9  0 - 44 U/L Final  . Alkaline Phosphatase 02/10/2018 51  38 - 126 U/L Final  . Total Bilirubin 02/10/2018 0.6  0.3 - 1.2 mg/dL Final  . GFR calc non Af Amer 02/10/2018 >60  >60 mL/min Final  . GFR calc Af Amer 02/10/2018 >60  >60 mL/min Final  . Anion gap 02/10/2018 8  5 - 15 Final   Performed at [facility], [address] [place], [place] [address]     Pathology Orders Placed This Encounter  Procedures  . CBC with Differential/Platelet    Standing Status:   Future    Standing Expiration Date:   02/11/2019  . Comprehensive metabolic panel     Standing Status:   Future    Standing Expiration Date:   02/11/2019  . Lactate dehydrogenase    Standing Status:   Future    Standing Expiration Date:   02/11/2019  . T4 AND TSH    Standing Status:   Future    Standing Expiration Date:   02/11/2019       Zoila Shutter MD

## 2018-02-10 NOTE — Progress Notes (Signed)
[place] reviewed with and pt seen by Dr. Walden Field and pt approved for chemo tx today per MD              Tammy Sours tolerated chemo tx and Vit B12 injection well without complaints or incident. VSS upon discharge. Pt discharged self ambulatory in satisfactory condition accompanied by family member

## 2018-02-11 LAB — T4: T4, Total: 7.1 ug/dL (ref 4.5–12.0)

## 2018-02-14 ENCOUNTER — Encounter (HOSPITAL_COMMUNITY): Payer: Self-pay | Admitting: Internal Medicine

## 2018-02-24 ENCOUNTER — Other Ambulatory Visit: Payer: Self-pay | Admitting: Radiation Therapy

## 2018-02-27 ENCOUNTER — Ambulatory Visit (HOSPITAL_COMMUNITY)
Admission: RE | Admit: 2018-02-27 | Discharge: 2018-02-27 | Disposition: A | Discharge status: Home / Self Care | Payer: Medicare HMO | Source: Ambulatory Visit | Attending: Radiation Oncology | Admitting: Radiation Oncology

## 2018-02-27 DIAGNOSIS — D320 Benign neoplasm of cerebral meninges: Secondary | ICD-10-CM | POA: Diagnosis not present

## 2018-02-27 DIAGNOSIS — D329 Benign neoplasm of meninges, unspecified: Secondary | ICD-10-CM | POA: Diagnosis not present

## 2018-02-27 LAB — CREATININE, SERUM
Creatinine, Ser: 0.53 mg/dL (ref 0.44–1.00)
GFR calc Af Amer: >60 mL/min (ref >60)
GFR calc non Af Amer: >60 mL/min (ref >60)

## 2018-02-27 MED ORDER — GADOBUTROL 1 MMOL/ML IV SOLN
6.0000 mL | Freq: Once | INTRAVENOUS | Status: AC | PRN
  Administered 2018-02-27: 6 mL via INTRAVENOUS

## 2018-03-03 ENCOUNTER — Encounter (HOSPITAL_COMMUNITY): Payer: Self-pay | Admitting: Internal Medicine

## 2018-03-03 ENCOUNTER — Inpatient Hospital Stay (HOSPITAL_COMMUNITY): Payer: Medicare HMO

## 2018-03-03 ENCOUNTER — Other Ambulatory Visit: Payer: Self-pay

## 2018-03-03 ENCOUNTER — Inpatient Hospital Stay (HOSPITAL_COMMUNITY): Payer: Medicare HMO | Admitting: Internal Medicine

## 2018-03-03 VITALS — BP 148/70 | HR 57 | Temp 97.7°F | Resp 1

## 2018-03-03 VITALS — BP 157/55 | HR 61 | Temp 98.3°F | Resp 16 | Wt 133.6 lb

## 2018-03-03 DIAGNOSIS — E041 Nontoxic single thyroid nodule: Secondary | ICD-10-CM | POA: Diagnosis not present

## 2018-03-03 DIAGNOSIS — C3480 Malignant neoplasm of overlapping sites of unspecified bronchus and lung: Secondary | ICD-10-CM

## 2018-03-03 DIAGNOSIS — J439 Emphysema, unspecified: Secondary | ICD-10-CM

## 2018-03-03 DIAGNOSIS — Z5112 Encounter for antineoplastic immunotherapy: Secondary | ICD-10-CM

## 2018-03-03 DIAGNOSIS — C3482 Malignant neoplasm of overlapping sites of left bronchus and lung: Secondary | ICD-10-CM

## 2018-03-03 DIAGNOSIS — R634 Abnormal weight loss: Secondary | ICD-10-CM

## 2018-03-03 DIAGNOSIS — R590 Localized enlarged lymph nodes: Secondary | ICD-10-CM

## 2018-03-03 DIAGNOSIS — C3492 Malignant neoplasm of unspecified part of left bronchus or lung: Secondary | ICD-10-CM

## 2018-03-03 DIAGNOSIS — Z87891 Personal history of nicotine dependence: Secondary | ICD-10-CM

## 2018-03-03 DIAGNOSIS — E785 Hyperlipidemia, unspecified: Secondary | ICD-10-CM

## 2018-03-03 DIAGNOSIS — I10 Essential (primary) hypertension: Secondary | ICD-10-CM

## 2018-03-03 DIAGNOSIS — I700 Atherosclerosis of aorta: Secondary | ICD-10-CM | POA: Diagnosis not present

## 2018-03-03 DIAGNOSIS — Z7982 Long term (current) use of aspirin: Secondary | ICD-10-CM

## 2018-03-03 DIAGNOSIS — G939 Disorder of brain, unspecified: Secondary | ICD-10-CM | POA: Diagnosis not present

## 2018-03-03 DIAGNOSIS — Z5111 Encounter for antineoplastic chemotherapy: Secondary | ICD-10-CM

## 2018-03-03 DIAGNOSIS — Z79899 Other long term (current) drug therapy: Secondary | ICD-10-CM

## 2018-03-03 DIAGNOSIS — R0789 Other chest pain: Secondary | ICD-10-CM

## 2018-03-03 LAB — CBC WITH DIFFERENTIAL/PLATELET
Abs Immature Granulocytes: 0.02 10*3/uL (ref 0.00–0.07)
Basophils Absolute: 0 10*3/uL (ref 0.0–0.1)
Basophils Relative: 0 %
Eosinophils Absolute: 0.1 10*3/uL (ref 0.0–0.5)
Eosinophils Relative: 2 %
HCT: 36.2 % (ref 36.0–46.0)
Hemoglobin: 10.4 g/dL — ABNORMAL LOW (ref 12.0–15.0)
IMMATURE GRANULOCYTES: 0 %
Lymphocytes Relative: 13 %
Lymphs Abs: 0.6 10*3/uL — ABNORMAL LOW (ref 0.7–4.0)
MCH: 24.2 pg — ABNORMAL LOW (ref 26.0–34.0)
MCHC: 28.7 g/dL — ABNORMAL LOW (ref 30.0–36.0)
MCV: 84.4 fL (ref 80.0–100.0)
Monocytes Absolute: 0.6 10*3/uL (ref 0.1–1.0)
Monocytes Relative: 14 %
Neutro Abs: 3.2 10*3/uL (ref 1.7–7.7)
Neutrophils Relative %: 71 %
Platelets: 332 10*3/uL (ref 150–400)
RBC: 4.29 MIL/uL (ref 3.87–5.11)
RDW: 15.9 % — ABNORMAL HIGH (ref 11.5–15.5)
WBC: 4.5 10*3/uL (ref 4.0–10.5)
nRBC: 0 % (ref 0.0–0.2)

## 2018-03-03 LAB — COMPREHENSIVE METABOLIC PANEL
ALT: 9 U/L (ref 0–44)
AST: 18 U/L (ref 15–41)
Albumin: 3.1 g/dL — ABNORMAL LOW (ref 3.5–5.0)
Alkaline Phosphatase: 49 U/L (ref 38–126)
Anion gap: 6 (ref 5–15)
BUN: 11 mg/dL (ref 8–23)
CO2: 25 mmol/L (ref 22–32)
Calcium: 8.8 mg/dL — ABNORMAL LOW (ref 8.9–10.3)
Chloride: 106 mmol/L (ref 98–111)
Creatinine, Ser: 0.48 mg/dL (ref 0.44–1.00)
GFR calc Af Amer: >60 mL/min (ref >60)
Glucose, Bld: 114 mg/dL — ABNORMAL HIGH (ref 70–99)
Potassium: 3.6 mmol/L (ref 3.5–5.1)
Sodium: 137 mmol/L (ref 135–145)
Total Bilirubin: 0.6 mg/dL (ref 0.3–1.2)
Total Protein: 6.7 g/dL (ref 6.5–8.1)

## 2018-03-03 LAB — LACTATE DEHYDROGENASE: LDH: 159 U/L (ref 98–192)

## 2018-03-03 MED ORDER — PALONOSETRON HCL INJECTION 0.25 MG/5ML
0.2500 mg | Freq: Once | INTRAVENOUS | Status: AC
  Administered 2018-03-03: 0.25 mg via INTRAVENOUS

## 2018-03-03 MED ORDER — SODIUM CHLORIDE 0.9 % IV SOLN
900.0000 mg | Freq: Once | INTRAVENOUS | Status: AC
  Administered 2018-03-03: 900 mg via INTRAVENOUS
  Filled 2018-03-03: qty 20

## 2018-03-03 MED ORDER — SODIUM CHLORIDE 0.9% FLUSH
10.0000 mL | INTRAVENOUS | Status: DC | PRN
  Administered 2018-03-03 (×2): 10 mL
  Filled 2018-03-03 (×2): qty 10

## 2018-03-03 MED ORDER — SODIUM CHLORIDE 0.9 % IV SOLN
200.0000 mg | Freq: Once | INTRAVENOUS | Status: AC
  Administered 2018-03-03: 200 mg via INTRAVENOUS
  Filled 2018-03-03: qty 8

## 2018-03-03 MED ORDER — SODIUM CHLORIDE 0.9 % IV SOLN
Freq: Once | INTRAVENOUS | Status: AC
  Administered 2018-03-03: 11:00:00 via INTRAVENOUS
  Filled 2018-03-03: qty 5

## 2018-03-03 MED ORDER — SODIUM CHLORIDE 0.9 % IV SOLN
356.5000 mg | Freq: Once | INTRAVENOUS | Status: AC
  Administered 2018-03-03: 360 mg via INTRAVENOUS
  Filled 2018-03-03: qty 36

## 2018-03-03 MED ORDER — PALONOSETRON HCL INJECTION 0.25 MG/5ML
INTRAVENOUS | Status: AC
  Filled 2018-03-03: qty 5

## 2018-03-03 MED ORDER — SODIUM CHLORIDE 0.9 % IV SOLN
Freq: Once | INTRAVENOUS | Status: AC
  Administered 2018-03-03: 10:00:00 via INTRAVENOUS

## 2018-03-03 MED ORDER — HEPARIN SOD (PORK) LOCK FLUSH 100 UNIT/ML IV SOLN
500.0000 [IU] | Freq: Once | INTRAVENOUS | Status: AC | PRN
  Administered 2018-03-03: 500 [IU]

## 2018-03-03 NOTE — Progress Notes (Signed)
Diagnosis Adenocarcinoma of left lung, stage 4 (HCC) - Plan: CBC with Differential/Platelet, Comprehensive metabolic panel, Lactate dehydrogenase, NM PET Image Restag (PS) Skull Base To Thigh  Staging Cancer Staging No matching staging information was found for the patient.  Assessment and Plan:   1.  Stage IV NSCLC.  Pt had CTA of the chest on 11/16/2017 with findings of  IMPRESSION: 1. 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 There are multiple adjacent nodules within the left upper lobe as well as scattered nodules throughout the lungs bilaterally concerning for metastatic disease. Left hilar adenopathy concerning for metastatic disease. 2. Indeterminate left adrenal nodule. 3. No evidence for acute pulmonary embolus. 4. Aortic Atherosclerosis (ICD10-I70.0) and Emphysema (ICD10-J43.9).  She reports weight loss of 10 pounds over past month.    Pet scan done 12/01/2017 reviewed and showed   IMPRESSION: 1. Hypermetabolic LEFT hilar mass which encroaches upon the AP window. 2. Nodularity in the anterior LEFT upper lobe and superior LEFT upper lobe consistent with primary or metastatic lung cancer. 3. Hypermetabolic metastatic pulmonary nodule within the RIGHT upper lobe. 4. 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 5. 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 6. No evidence of metastatic lung carcinoma outside the thorax.  MRI of brain done 12/01/2017 reviewed and showed  IMPRESSION: 1. 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 2. No other enhancing lesions to suggest metastatic disease. 3. No acute intracranial abnormality. 4. Mild chronic small vessel ischemic disease. 5. Chronic cerebellar infarcts.  Pt had  biopsy done of LUL on 12/17/2017 with pathology returning as adenocarcinoma.  EBUS of level 11 node returned as NSCLC.    Pt has diagnosis of advanced NSCLC.  She is being treated with standard recommendation based on adenocarcinoma histology of Pembro dosed at 200 mg IV with Carboplatin AUC 5 with Alimta 500 mg/m2 every 3 weeks based on results of Keynote study that showed an increase in PFS and overall survival with the addition of Keytruda to chemotherapy in lung cancer. Pt will be treated with 4 cycles of therapy with repeat imaging.  Continuation of  Alimta and Pembro for maintenance therapy pending response.  Pt should continue U72 and folic acid as directed.  Foundation testing was unable to be done due to insufficient sample for analysis.   She had PDL1 Testing done 12/17/2017 with RPS of 10%.    Pt here for evaluation prior to C4.  Labs done 03/03/2018 reviewed and showed WBC 4.5 HB 10.4 plts 332,000.  Chemistries WNL with K+ 3.6 Cr 0.48 and normal LFTs.  Counts adequate for treatment.    She is set up for PET scan in 2 week for follow-up evaluation.  She will RTC in 3 weeks with repeat labs and to go over results.     02/10/2018 reviewed and showed WBC 3.9 HB 11.3 plts 375,000.  Chemistries WNL with K+ 3.9 Cr 0.53 and normal LFTs.  TSH WNL at 0.434.  Pt will RTC in 3 weeks to go over labs and scans.    2.  Brain lesion.  MRI of brain done due to advanced lung cancer showed impression:   1. 6 mm posterior right frontal extra-axial mass most likelyreflecting an incidental meningioma.  Pt has been seen by RT for evaluation based on the brain lesion  noted on MRI and due to PET findings.  Based on RT review, the patient has findings consistent with multiple pulmonary metastases.  She also had a 0.6 cm extra-axial mass found on brain MRI scan.  This finding appears to represent a meningioma.  After reviewing this study, Dr. Lisbeth Renshaw felt  it is reasonable to obtain a repeat MRI scan in about 8 weeks for  follow-up.   If this remains stable as expected, then further brain imaging could be spaced out further.    Pt had MRI of brain done 02/27/2018 that was reviewed and showed  IMPRESSION: 1. Stable small right frontal meningioma 2. New 5 mm enhancing nodule right inferior cerebellar vermis. Given history of lung cancer, this could represent metastatic disease. No other enhancing metastatic deposits. Short term imaging follow-up recommended for further evaluation.  She has not followed up with Dr. Lisbeth Renshaw.  Will arrange follow-up for RT review.    3.  HTN.  BP is 157/55.  Follow-up with PCP.    4.  Chest wall discomfort.  Pt reports this is improved.  CTA negative for PE.  Likely due to advanced lung cancer.   Pt previously prescribed Vicodin.    25 minutes spent with review of records, counseling and coordination of care.    Interval History:  Historical data obtained from note dated 11/18/2017:  78 yr old female presented to ED due to shortness of breath.  She has a long smoking history.  She underwent CTA of the chest on 11/16/2017 with findings of  IMPRESSION: 1. 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 There are multiple adjacent nodules within the left upper lobe as well as scattered nodules throughout the lungs bilaterally concerning for metastatic disease. Left hilar adenopathy concerning for metastatic disease. 2. Indeterminate left adrenal nodule. 3. No evidence for acute pulmonary embolus. 4. Aortic Atherosclerosis (ICD10-I70.0) and Emphysema (ICD10-J43.9).  Current Status:  Pt is seen today for follow-up.  She is here for evaluation prior to C4 of Pembro, Carboplatin and Alimta.  She has not followed up with Dr. Lisbeth Renshaw.      Lung cancer ([place])   12/23/2017 Initial Diagnosis    Lung cancer ([place])    12/29/2017 -  Chemotherapy    The patient had palonosetron (ALOXI) injection 0.25 mg, 0.25 mg, Intravenous,  Once, 4 of 6 cycles Administration:  0.25 mg (12/29/2017), 0.25 mg (02/10/2018), 0.25 mg (03/03/2018), 0.25 mg (01/20/2018) PEMEtrexed (ALIMTA) 900 mg in sodium chloride 0.9 % 100 mL chemo infusion, 515 mg/m2 = 875 mg, Intravenous,  Once, 4 of 6 cycles Administration: 900 mg (12/29/2017), 900 mg (02/10/2018), 900 mg (01/20/2018) CARBOplatin (PARAPLATIN) 360 mg in sodium chloride 0.9 % 250 mL chemo infusion, 360 mg (100 % of original dose 356.5 mg), Intravenous,  Once, 4 of 6 cycles Dose modification:   (original dose 356.5 mg, Cycle 1),   (original dose 356.5 mg, Cycle 3),   (original dose 356.5 mg, Cycle 4),   (original dose 356.5 mg, Cycle 2) Administration: 360 mg (12/29/2017), 360 mg (02/10/2018), 360 mg (01/20/2018) pembrolizumab (KEYTRUDA) 200 mg in sodium chloride 0.9 % 50 mL chemo infusion, 200 mg, Intravenous, Once, 4 of 6 cycles Administration: 200 mg (12/29/2017), 200 mg (02/10/2018), 200 mg (01/20/2018) fosaprepitant (EMEND) 150 mg, dexamethasone (DECADRON) 12 mg in sodium chloride 0.9 % 145 mL IVPB, , Intravenous,  Once, 4 of 6 cycles Administration:  (12/29/2017),  (02/10/2018),  (01/20/2018)  for chemotherapy treatment.       Problem List  Patient Active Problem List   Diagnosis Date Noted  . Lung cancer ([place]) [C34.90] 12/23/2017  . Special screening for malignant neoplasms, colon [Z12.11]     Past Medical History Past Medical History:  Diagnosis Date  . Anemia   . Borderline diabetes   . Dyspnea   . Hyperlipidemia   . Hypertension   . Lung cancer ([place]) 12/23/2017  . Pneumonia   . Pre-diabetes     Past Surgical History Past Surgical History:  Procedure Laterality Date  . ABDOMINAL HYSTERECTOMY    . APPENDECTOMY    . COLONOSCOPY N/A 07/16/2017   Procedure: COLONOSCOPY;  Surgeon: Danie Binder, MD;  Location[facility];  Service: Endoscopy;  Laterality: N/A;  10:30  . IR IMAGING GUIDED PORT INSERTION  12/24/2017  . POLYPECTOMY  07/16/2017   Procedure: POLYPECTOMY;  Surgeon: Danie Binder, MD;   Location: [facility];  Service: Endoscopy;;  cecal x2;hepatic flexurex5; splenic x1;descending x2  . VIDEO BRONCHOSCOPY WITH ENDOBRONCHIAL ULTRASOUND N/A 12/17/2017   Procedure: VIDEO BRONCHOSCOPY WITH ENDOBRONCHIAL ULTRASOUND;  Surgeon: Melrose Nakayama, MD;  Location: [facility] OR;  Service: Thoracic;  Laterality: N/A;    Family History Family History  Problem Relation Age of Onset  . Hypertension Mother   . Hypertension Father   . Colon cancer Neg Hx   . Colon polyps Neg Hx      Social History  reports that she quit smoking about 5 years ago. Her smoking use included cigarettes. She has a 40.00 pack-year smoking history. She has never used smokeless tobacco. She reports that she does not drink alcohol or use drugs.  Medications  Current Outpatient Medications:  .  amLODipine (NORVASC) 5 MG tablet, Take 5 mg by mouth every evening. , Disp: , Rfl:  .  aspirin EC 81 MG tablet, Take 81 mg by mouth every evening. , Disp: , Rfl:  .  CARBOPLATIN IV, Inject into the vein every 21 ( twenty-one) days. , Disp: , Rfl:  .  dexamethasone (DECADRON) 4 MG tablet, Take 1 tab two times a day the day before Alimta chemo, then take 2 tabs once a day for 3 days starting the day after chemo., Disp: 30 tablet, Rfl: 1 .  folic acid (FOLVITE) 1 MG tablet, Take 1 tablet (1 mg total) by mouth daily. Start 5-7 days before Alimta chemotherapy. Continue until 21 days after Alimta completed., Disp: 100 tablet, Rfl: 3 .  HYDROcodone-acetaminophen (NORCO/VICODIN) 5-325 MG tablet, Take 1 tablet by mouth every 4 (four) hours as needed for moderate pain. 1 or 2 tabs PO q4 hours prn pain, Disp: 90 tablet, Rfl: 0 .  lidocaine-prilocaine (EMLA) cream, Apply to affected area once, Disp: 30 g, Rfl: 3 .  lisinopril (PRINIVIL,ZESTRIL) 5 MG tablet, Take 5 mg by mouth every evening. , Disp: , Rfl:  .  Pembrolizumab (KEYTRUDA IV), Inject 200 mg into the vein every 21 ( twenty-one) days., Disp: , Rfl:  .  PEMEtrexed Disodium  (ALIMTA IV), Inject 875 mg into the vein every 21 ( twenty-one) days., Disp: , Rfl:  .  simvastatin (ZOCOR) 40 MG tablet, Take 40 mg by mouth every evening. , Disp: , Rfl:  .  albuterol (PROVENTIL HFA;VENTOLIN HFA) 108 (90 Base) MCG/ACT inhaler, Inhale 2 puffs into the lungs every 4 (four) hours as needed for wheezing or shortness of breath (or coughing). (Patient not taking: Reported on 03/03/2018), Disp: 1 Inhaler, Rfl: 0 .  prochlorperazine (COMPAZINE) 10 MG tablet, Take 1 tablet (10  mg total) by mouth every 6 (six) hours as needed (Nausea or vomiting). (Patient not taking: Reported on 02/10/2018), Disp: 30 tablet, Rfl: 1 No current facility-administered medications for this visit.   Facility-Administered Medications Ordered in Other Visits:  .  CARBOplatin (PARAPLATIN) 360 mg in sodium chloride 0.9 % 250 mL chemo infusion, 360 mg, Intravenous, Once, Azani Brogdon, MD .  heparin lock flush 100 unit/mL, 500 Units, Intracatheter, Once PRN, Mayleen Borrero, Mathis Dad, MD .  sodium chloride flush (NS) 0.9 % injection 10 mL, 10 mL, Intracatheter, PRN, Rucha Wissinger, MD, 10 mL at 03/03/18 0959  Allergies Codeine  Review of Systems Review of Systems - Oncology ROS negative   Physical Exam  Vitals Wt Readings from Last 3 Encounters:  03/03/18 133 lb 9 oz (60.6 kg)  02/10/18 129 lb 7 oz (58.7 kg)  01/20/18 131 lb 12.8 oz (59.8 kg)   Temp Readings from Last 3 Encounters:  03/03/18 98.3 F (36.8 C) (Oral)  02/10/18 97.8 F (36.6 C) (Oral)  02/10/18 97.6 F (36.4 C) (Oral)   BP Readings from Last 3 Encounters:  03/03/18 (!) 157/55  02/10/18 (!) 164/54  02/10/18 (!) 147/66   Pulse Readings from Last 3 Encounters:  03/03/18 61  02/10/18 63  02/10/18 64   Constitutional: Well-developed, well-nourished, and in no distress.   HENT: Head: Normocephalic and atraumatic.  Mouth/Throat: No oropharyngeal exudate. Mucosa moist. Eyes: Pupils are equal, round, and reactive to light. Conjunctivae are normal. No  scleral icterus.  Neck: Normal range of motion. Neck supple. No JVD present.  Cardiovascular: Normal rate, regular rhythm and normal heart sounds.  Exam reveals no gallop and no friction rub.   No murmur heard. Pulmonary/Chest: Effort normal and breath sounds normal. No respiratory distress. No wheezes.No rales.  Abdominal: Soft. Bowel sounds are normal. No distension. There is no tenderness. There is no guarding.  Musculoskeletal: No edema or tenderness.  Lymphadenopathy: No cervical, axillary or supraclavicular adenopathy.  Neurological: Alert and oriented to person, place, and time. No cranial nerve deficit.  Skin: Skin is warm and dry. No rash noted. No erythema. No pallor.  Psychiatric: Affect and judgment normal.   Labs Appointment on 03/03/2018  Component Date Value Ref Range Status  . WBC 03/03/2018 4.5  4.0 - 10.5 K/uL Final  . RBC 03/03/2018 4.29  3.87 - 5.11 MIL/uL Final  . Hemoglobin 03/03/2018 10.4* 12.0 - 15.0 g/dL Final  . HCT 03/03/2018 36.2  36.0 - 46.0 % Final  . MCV 03/03/2018 84.4  80.0 - 100.0 fL Final  . MCH 03/03/2018 24.2* 26.0 - 34.0 pg Final  . MCHC 03/03/2018 28.7* 30.0 - 36.0 g/dL Final  . RDW 03/03/2018 15.9* 11.5 - 15.5 % Final  . Platelets 03/03/2018 332  150 - 400 K/uL Final  . nRBC 03/03/2018 0.0  0.0 - 0.2 % Final  . Neutrophils Relative % 03/03/2018 71  % Final  . Neutro Abs 03/03/2018 3.2  1.7 - 7.7 K/uL Final  . Lymphocytes Relative 03/03/2018 13  % Final  . Lymphs Abs 03/03/2018 0.6* 0.7 - 4.0 K/uL Final  . Monocytes Relative 03/03/2018 14  % Final  . Monocytes Absolute 03/03/2018 0.6  0.1 - 1.0 K/uL Final  . Eosinophils Relative 03/03/2018 2  % Final  . Eosinophils Absolute 03/03/2018 0.1  0.0 - 0.5 K/uL Final  . Basophils Relative 03/03/2018 0  % Final  . Basophils Absolute 03/03/2018 0.0  0.0 - 0.1 K/uL Final  . Immature Granulocytes 03/03/2018 0  %  Final  . Abs Immature Granulocytes 03/03/2018 0.02  0.00 - 0.07 K/uL Final   Performed at  [facility], [address], [place], [place] [address]  . Sodium 03/03/2018 137  135 - 145 mmol/L Final  . Potassium 03/03/2018 3.6  3.5 - 5.1 mmol/L Final  . Chloride 03/03/2018 106  98 - 111 mmol/L Final  . CO2 03/03/2018 25  22 - 32 mmol/L Final  . Glucose, Bld 03/03/2018 114* 70 - 99 mg/dL Final  . BUN 03/03/2018 11  8 - 23 mg/dL Final  . Creatinine, Ser 03/03/2018 0.48  0.44 - 1.00 mg/dL Final  . Calcium 03/03/2018 8.8* 8.9 - 10.3 mg/dL Final  . Total Protein 03/03/2018 6.7  6.5 - 8.1 g/dL Final  . Albumin 03/03/2018 3.1* 3.5 - 5.0 g/dL Final  . AST 03/03/2018 18  15 - 41 U/L Final  . ALT 03/03/2018 9  0 - 44 U/L Final  . Alkaline Phosphatase 03/03/2018 49  38 - 126 U/L Final  . Total Bilirubin 03/03/2018 0.6  0.3 - 1.2 mg/dL Final  . GFR calc non Af Amer 03/03/2018 >60  >60 mL/min Final  . GFR calc Af Amer 03/03/2018 >60  >60 mL/min Final  . Anion gap 03/03/2018 6  5 - 15 Final   Performed at [facility], [address] [place], [place] [address]  . LDH 03/03/2018 159  98 - 192 U/L Final   Performed at [facility], [address], [place], [place] [address]     Pathology Orders Placed This Encounter  Procedures  . NM PET Image Restag (PS) Skull Base To Thigh    Standing Status:   Future    Standing Expiration Date:   03/03/2019    Order Specific Question:   If indicated for the ordered procedure, I authorize the administration of a radiopharmaceutical per Radiology protocol    Answer:   Yes    Order Specific Question:   Preferred imaging location?    Answer:   [facility]    Order Specific Question:   Radiology Contrast Protocol - do NOT remove file path    Answer:   [email or web address]  . CBC with Differential/Platelet    Standing Status:   Future    Standing Expiration Date:   03/04/2019  . Comprehensive metabolic panel    Standing Status:   Future    Standing Expiration Date:   03/04/2019  . Lactate dehydrogenase    Standing Status:    Future    Standing Expiration Date:   03/04/2019       Zoila Shutter MD

## 2018-03-03 NOTE — Progress Notes (Signed)
Treatment given today per MD orders. Tolerated infusion without adverse affects. Vital signs stable. No complaints at this time. Discharged from clinic ambulatory. F/U with [facility] as scheduled.   

## 2018-03-03 NOTE — Progress Notes (Signed)
Patient seen by Dr. Walden Field with lab review and ok to treat today.  Family at side with no s/s of distress noted.

## 2018-03-03 NOTE — Patient Instructions (Signed)
[facility] Discharge Instructions for Patients Receiving Chemotherapy  Today you received the following chemotherapy agents  If you develop nausea and vomiting that is not controlled by your nausea medication, call the clinic.   BELOW ARE SYMPTOMS THAT SHOULD BE REPORTED IMMEDIATELY:  *FEVER GREATER THAN 100.5 F  *CHILLS WITH OR WITHOUT FEVER  NAUSEA AND VOMITING THAT IS NOT CONTROLLED WITH YOUR NAUSEA MEDICATION  *UNUSUAL SHORTNESS OF BREATH  *UNUSUAL BRUISING OR BLEEDING  TENDERNESS IN MOUTH AND THROAT WITH OR WITHOUT PRESENCE OF ULCERS  *URINARY PROBLEMS  *BOWEL PROBLEMS  UNUSUAL RASH Items with * indicate a potential emergency and should be followed up as soon as possible.  Feel free to call the clinic should you have any questions or concerns. The clinic phone number is [phone].  Please show the CHEMO ALERT CARD at check-in to the Emergency Department and triage nurse.   

## 2018-03-04 ENCOUNTER — Inpatient Hospital Stay: Payer: Medicare HMO | Attending: Internal Medicine

## 2018-03-06 ENCOUNTER — Telehealth: Payer: Self-pay | Admitting: Radiation Oncology

## 2018-03-06 NOTE — Telephone Encounter (Signed)
I called the patient to review her MRI results and the rationale to treat the newly noted 5 mm lesion in the brain as this was reviewed and discussed in brain oncology conference. She would be a candidate for SRS in one fraction to proceed with treatment to this area. She is interested in proceeding. We anticipate simulation on 03/10/2018 and she would have IV contrast. We anticipate treatment on 03/13/2018. We discussed risks, benefits, short, and long term effects of therapy. She is interested in proceeding.

## 2018-03-06 NOTE — Telephone Encounter (Signed)
Certainly

## 2018-03-10 ENCOUNTER — Ambulatory Visit
Admission: RE | Admit: 2018-03-10 | Discharge: 2018-03-10 | Disposition: A | Discharge status: Home / Self Care | Payer: Medicare HMO | Source: Ambulatory Visit | Attending: Radiation Oncology | Admitting: Radiation Oncology

## 2018-03-10 VITALS — BP 153/59 | HR 72 | Temp 98.1°F | Resp 20 | Ht 69.5 in | Wt 127.0 lb

## 2018-03-10 DIAGNOSIS — Z510 Encounter for antineoplastic radiation therapy: Secondary | ICD-10-CM | POA: Insufficient documentation

## 2018-03-10 DIAGNOSIS — C7931 Secondary malignant neoplasm of brain: Secondary | ICD-10-CM | POA: Insufficient documentation

## 2018-03-10 DIAGNOSIS — C3480 Malignant neoplasm of overlapping sites of unspecified bronchus and lung: Secondary | ICD-10-CM

## 2018-03-10 MED ORDER — SODIUM CHLORIDE 0.9% FLUSH
10.0000 mL | Freq: Once | INTRAVENOUS | Status: AC
  Administered 2018-03-10: 10 mL via INTRAVENOUS

## 2018-03-10 NOTE — Progress Notes (Signed)
Has armband been applied?  Yes  Does patient have an allergy to IV contrast dye?: No   Has patient ever received premedication for IV contrast dye?: No  Does patient take metformin?: No  If patient does take metformin when was the last dose: N/A  Date of lab work: 03/03/2018 BUN: 11 CR: 0.48 EGfr: >60  IV site: Left AC  Has IV site been added to flowsheet?  Yes  BP (!) 153/59 (BP Location: Left Arm, Patient Position: Sitting)   Pulse 72   Temp 98.1 F (36.7 C) (Oral)   Resp 20   Ht 5' 9.5" (1.765 m)   Wt 127 lb (57.6 kg)   SpO2 99%   BMI 18.49 kg/m

## 2018-03-10 NOTE — Progress Notes (Signed)
  Radiation Oncology         [phone] ________________________________  Name: MARGEL JOENS MRN: [id number]  Date: 03/10/2018  DOB: [birth year]  DIAGNOSIS:     ICD-10-CM   1. 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  Identity was confirmed.  All relevant records and images related to the planned course of therapy were reviewed.  The patient freely provided informed written consent to proceed with treatment after reviewing the details related to the planned course of therapy. The consent form was witnessed and verified by the simulation staff. Intravenous access was established for contrast administration. Then, the patient was set-up in a stable reproducible supine position for radiation therapy.  A relocatable thermoplastic stereotactic head frame was fabricated for precise immobilization.  CT images were obtained.  Surface markings were placed.  The CT images were loaded into the planning software and fused with the patient's targeting MRI scan.  Then the target and avoidance structures were contoured.  Treatment planning then occurred.  The radiation prescription was entered and confirmed.  I have requested 3D planning  I have requested a DVH of the following structures: Brain stem, brain, left eye, right eye, lenses, optic chiasm, target volumes, uninvolved brain, and normal tissue.    SPECIAL TREATMENT PROCEDURE:  The planned course of therapy using radiation constitutes a special treatment procedure. Special care is required in the management of this patient for the following reasons. This treatment constitutes a Special Treatment Procedure for the following reason: High dose per fraction requiring special monitoring for increased toxicities of treatment including daily imaging.  The special nature of the planned course of radiotherapy will require increased physician supervision and oversight to ensure patient's safety with  optimal treatment outcomes.  PLAN:  The patient will receive 20 Gy in 1 fraction.   ------------------------------------------------  Jodelle Gross, MD, PhD

## 2018-03-12 ENCOUNTER — Encounter (HOSPITAL_COMMUNITY)
Admission: RE | Admit: 2018-03-12 | Discharge: 2018-03-12 | Disposition: A | Discharge status: Home / Self Care | Payer: Medicare HMO | Source: Ambulatory Visit | Attending: Internal Medicine | Admitting: Internal Medicine

## 2018-03-12 DIAGNOSIS — Z510 Encounter for antineoplastic radiation therapy: Secondary | ICD-10-CM | POA: Diagnosis not present

## 2018-03-12 DIAGNOSIS — C3490 Malignant neoplasm of unspecified part of unspecified bronchus or lung: Secondary | ICD-10-CM | POA: Diagnosis not present

## 2018-03-12 DIAGNOSIS — C7951 Secondary malignant neoplasm of bone: Secondary | ICD-10-CM | POA: Diagnosis not present

## 2018-03-12 DIAGNOSIS — C7931 Secondary malignant neoplasm of brain: Secondary | ICD-10-CM | POA: Diagnosis not present

## 2018-03-12 DIAGNOSIS — C3492 Malignant neoplasm of unspecified part of left bronchus or lung: Secondary | ICD-10-CM | POA: Diagnosis not present

## 2018-03-12 LAB — GLUCOSE, CAPILLARY: Glucose-Capillary: 107 mg/dL — ABNORMAL HIGH (ref 70–99)

## 2018-03-12 MED ORDER — FLUDEOXYGLUCOSE F - 18 (FDG) INJECTION
6.2600 | Freq: Once | INTRAVENOUS | Status: AC | PRN
  Administered 2018-03-12: 6.26 via INTRAVENOUS

## 2018-03-13 ENCOUNTER — Encounter: Payer: Self-pay | Admitting: Radiation Oncology

## 2018-03-13 ENCOUNTER — Ambulatory Visit
Admission: RE | Admit: 2018-03-13 | Discharge: 2018-03-13 | Disposition: A | Discharge status: Home / Self Care | Payer: Medicare HMO | Source: Ambulatory Visit | Attending: Radiation Oncology | Admitting: Radiation Oncology

## 2018-03-13 VITALS — BP 149/66 | HR 64 | Temp 98.7°F | Resp 20

## 2018-03-13 DIAGNOSIS — C7931 Secondary malignant neoplasm of brain: Secondary | ICD-10-CM | POA: Diagnosis not present

## 2018-03-13 DIAGNOSIS — Z510 Encounter for antineoplastic radiation therapy: Secondary | ICD-10-CM | POA: Diagnosis not present

## 2018-03-13 NOTE — Progress Notes (Signed)
Roselani rested with [place] for 20 minutes following her SRS treatment.  Patient denies headache, dizziness, nausea, diplopia or ringing in the ears. Denies fatigue. Patient without complaints. Understands to avoid strenuous activity for the next 24 hours and call [phone] with needs.    BP (!) 149/66 (BP Location: Right Arm, Patient Position: Sitting)   Pulse 64   Temp 98.7 F (37.1 C) (Oral)   Resp 20   SpO2 100%    Jenet Durio M. Leonie Green, BSN

## 2018-03-13 NOTE — Progress Notes (Signed)
  Radiation Oncology         [phone]) [phone] ________________________________  Name: Melinda Larsen MRN: [id number]  Date: 03/13/2018  DOB: [birth year]   SPECIAL TREATMENT PROCEDURE   3D TREATMENT PLANNING AND DOSIMETRY: The patient's radiation plan was reviewed and approved by Dr. Elenor Legato from neurosurgery and radiation oncology prior to treatment. It showed 3-dimensional radiation distributions overlaid onto the planning CT/MRI image set. The [facility] for the target structures as well as the organs at risk were reviewed. The documentation of the 3D plan and dosimetry are filed in the radiation oncology EMR.   NARRATIVE: The patient was brought to the TrueBeam stereotactic radiation treatment machine and placed supine on the CT couch. The head frame was applied, and the patient was set up for stereotactic radiosurgery. Neurosurgery was present for the set-up and delivery   SIMULATION VERIFICATION: In the couch zero-angle position, the patient underwent Exactrac imaging using the Brainlab system with orthogonal KV images. These were carefully aligned and repeated to confirm treatment position for each of the isocenters. The Exactrac snap film verification was repeated at each couch angle.   SPECIAL TREATMENT PROCEDURE: The patient received stereotactic radiosurgery to the following target:  PTV target was treated using 3 Arcs to a prescription dose of 20 Gy. ExacTrac Snap verification was performed for each couch angle.   STEREOTACTIC TREATMENT MANAGEMENT: Following delivery, the patient was transported to nursing in stable condition and monitored for possible acute effects. Vital signs were recorded . The patient tolerated treatment without significant acute effects, and was discharged to home in stable condition.  PLAN: Follow-up in one month.   ------------------------------------------------  Jodelle Gross, MD, PhD

## 2018-03-24 ENCOUNTER — Inpatient Hospital Stay (HOSPITAL_COMMUNITY): Payer: Medicare HMO

## 2018-03-24 ENCOUNTER — Inpatient Hospital Stay (HOSPITAL_COMMUNITY): Payer: Medicare HMO | Attending: Hematology

## 2018-03-24 ENCOUNTER — Encounter (HOSPITAL_COMMUNITY): Payer: Self-pay | Admitting: Internal Medicine

## 2018-03-24 ENCOUNTER — Other Ambulatory Visit: Payer: Self-pay

## 2018-03-24 ENCOUNTER — Inpatient Hospital Stay (HOSPITAL_COMMUNITY): Payer: Medicare HMO | Admitting: Internal Medicine

## 2018-03-24 DIAGNOSIS — J439 Emphysema, unspecified: Secondary | ICD-10-CM | POA: Diagnosis not present

## 2018-03-24 DIAGNOSIS — K759 Inflammatory liver disease, unspecified: Secondary | ICD-10-CM

## 2018-03-24 DIAGNOSIS — E785 Hyperlipidemia, unspecified: Secondary | ICD-10-CM

## 2018-03-24 DIAGNOSIS — Z79899 Other long term (current) drug therapy: Secondary | ICD-10-CM | POA: Diagnosis not present

## 2018-03-24 DIAGNOSIS — Z8673 Personal history of transient ischemic attack (TIA), and cerebral infarction without residual deficits: Secondary | ICD-10-CM

## 2018-03-24 DIAGNOSIS — C7931 Secondary malignant neoplasm of brain: Secondary | ICD-10-CM

## 2018-03-24 DIAGNOSIS — I700 Atherosclerosis of aorta: Secondary | ICD-10-CM | POA: Diagnosis not present

## 2018-03-24 DIAGNOSIS — I10 Essential (primary) hypertension: Secondary | ICD-10-CM | POA: Insufficient documentation

## 2018-03-24 DIAGNOSIS — C7801 Secondary malignant neoplasm of right lung: Secondary | ICD-10-CM | POA: Diagnosis not present

## 2018-03-24 DIAGNOSIS — C7951 Secondary malignant neoplasm of bone: Secondary | ICD-10-CM | POA: Diagnosis not present

## 2018-03-24 DIAGNOSIS — R634 Abnormal weight loss: Secondary | ICD-10-CM | POA: Diagnosis not present

## 2018-03-24 DIAGNOSIS — K529 Noninfective gastroenteritis and colitis, unspecified: Secondary | ICD-10-CM | POA: Insufficient documentation

## 2018-03-24 DIAGNOSIS — D649 Anemia, unspecified: Secondary | ICD-10-CM | POA: Diagnosis not present

## 2018-03-24 DIAGNOSIS — C3402 Malignant neoplasm of left main bronchus: Secondary | ICD-10-CM | POA: Diagnosis not present

## 2018-03-24 DIAGNOSIS — C3492 Malignant neoplasm of unspecified part of left bronchus or lung: Secondary | ICD-10-CM

## 2018-03-24 DIAGNOSIS — E041 Nontoxic single thyroid nodule: Secondary | ICD-10-CM

## 2018-03-24 DIAGNOSIS — R630 Anorexia: Secondary | ICD-10-CM

## 2018-03-24 DIAGNOSIS — Z7982 Long term (current) use of aspirin: Secondary | ICD-10-CM

## 2018-03-24 DIAGNOSIS — C320 Malignant neoplasm of glottis: Secondary | ICD-10-CM | POA: Diagnosis not present

## 2018-03-24 DIAGNOSIS — Z87891 Personal history of nicotine dependence: Secondary | ICD-10-CM | POA: Insufficient documentation

## 2018-03-24 DIAGNOSIS — D320 Benign neoplasm of cerebral meninges: Secondary | ICD-10-CM | POA: Diagnosis not present

## 2018-03-24 DIAGNOSIS — Z5112 Encounter for antineoplastic immunotherapy: Secondary | ICD-10-CM | POA: Diagnosis not present

## 2018-03-24 DIAGNOSIS — Z5111 Encounter for antineoplastic chemotherapy: Secondary | ICD-10-CM

## 2018-03-24 HISTORY — DX: Secondary malignant neoplasm of bone: C79.51

## 2018-03-24 LAB — CBC WITH DIFFERENTIAL/PLATELET
Abs Immature Granulocytes: 0.02 10*3/uL (ref 0.00–0.07)
BASOS PCT: 1 %
Basophils Absolute: 0 10*3/uL (ref 0.0–0.1)
Eosinophils Absolute: 0.1 10*3/uL (ref 0.0–0.5)
Eosinophils Relative: 1 %
HCT: 34.8 % — ABNORMAL LOW (ref 36.0–46.0)
Hemoglobin: 10.1 g/dL — ABNORMAL LOW (ref 12.0–15.0)
Immature Granulocytes: 0 %
Lymphocytes Relative: 9 %
Lymphs Abs: 0.4 10*3/uL — ABNORMAL LOW (ref 0.7–4.0)
MCH: 24.5 pg — ABNORMAL LOW (ref 26.0–34.0)
MCHC: 29 g/dL — ABNORMAL LOW (ref 30.0–36.0)
MCV: 84.5 fL (ref 80.0–100.0)
Monocytes Absolute: 0.8 10*3/uL (ref 0.1–1.0)
Monocytes Relative: 16 %
NEUTROS PCT: 73 %
Neutro Abs: 3.5 10*3/uL (ref 1.7–7.7)
PLATELETS: 396 10*3/uL (ref 150–400)
RBC: 4.12 MIL/uL (ref 3.87–5.11)
RDW: 16.1 % — ABNORMAL HIGH (ref 11.5–15.5)
WBC: 4.8 10*3/uL (ref 4.0–10.5)
nRBC: 0 % (ref 0.0–0.2)

## 2018-03-24 LAB — COMPREHENSIVE METABOLIC PANEL
ALT: <5 U/L (ref 0–44)
AST: 17 U/L (ref 15–41)
Albumin: 3.2 g/dL — ABNORMAL LOW (ref 3.5–5.0)
Alkaline Phosphatase: 45 U/L (ref 38–126)
Anion gap: 7 (ref 5–15)
BUN: 13 mg/dL (ref 8–23)
CO2: 24 mmol/L (ref 22–32)
Calcium: 8.8 mg/dL — ABNORMAL LOW (ref 8.9–10.3)
Chloride: 105 mmol/L (ref 98–111)
Creatinine, Ser: 0.53 mg/dL (ref 0.44–1.00)
GFR calc Af Amer: >60 mL/min (ref >60)
GFR calc non Af Amer: >60 mL/min (ref >60)
Glucose, Bld: 106 mg/dL — ABNORMAL HIGH (ref 70–99)
Potassium: 3.5 mmol/L (ref 3.5–5.1)
Sodium: 136 mmol/L (ref 135–145)
Total Bilirubin: 0.7 mg/dL (ref 0.3–1.2)
Total Protein: 7 g/dL (ref 6.5–8.1)

## 2018-03-24 LAB — LACTATE DEHYDROGENASE: LDH: 220 U/L — ABNORMAL HIGH (ref 98–192)

## 2018-03-24 NOTE — Progress Notes (Signed)
No treatment today per MD.  

## 2018-03-24 NOTE — Progress Notes (Signed)
Per Dr. Walden Field, " No treatment today." Treatment plan to change. Pt seen by Dr. Walden Field today. Pharmacy notified of no treatment today.

## 2018-03-24 NOTE — Progress Notes (Signed)
Diagnosis Metastasis to bone [facility])  Staging Cancer Staging No matching staging information was found for the patient.  Assessment and Plan:  1.  Stage IV NSCLC.  Pt had CTA of the chest on 11/16/2017 with findings of  IMPRESSION: 1. 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 There are multiple adjacent nodules within the left upper lobe as well as scattered nodules throughout the lungs bilaterally concerning for metastatic disease. Left hilar adenopathy concerning for metastatic disease. 2. Indeterminate left adrenal nodule. 3. No evidence for acute pulmonary embolus. 4. Aortic Atherosclerosis (ICD10-I70.0) and Emphysema (ICD10-J43.9).  She reports weight loss of 10 pounds over past month.    Pet scan done 12/01/2017 reviewed and showed   IMPRESSION: 1. Hypermetabolic LEFT hilar mass which encroaches upon the AP window. 2. Nodularity in the anterior LEFT upper lobe and superior LEFT upper lobe consistent with primary or metastatic lung cancer. 3. Hypermetabolic metastatic pulmonary nodule within the RIGHT upper lobe. 4. 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 5. 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 6. No evidence of metastatic lung carcinoma outside the thorax.  MRI of brain done 12/01/2017 reviewed and showed  IMPRESSION: 1. 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 2. No other enhancing lesions to suggest metastatic disease. 3. No acute intracranial abnormality. 4. Mild chronic small vessel ischemic disease. 5. Chronic cerebellar infarcts.  Pt had biopsy done of LUL on 12/17/2017 with pathology returning as adenocarcinoma.  EBUS of level 11 node returned as NSCLC.    Pt has diagnosis of advanced NSCLC.   She was treated with standard recommendation based on adenocarcinoma histology of [place] dosed at 200 mg IV with Carboplatin AUC 5 with Alimta 500 mg/m2 every 3 weeks based on results of Keynote study that showed an increase in PFS and overall survival with the addition of Keytruda to chemotherapy in lung cancer.   Foundation testing was unable to be done due to insufficient sample for analysis.   She had PDL1 Testing done 12/17/2017 with RPS of 10%.    Pt has completed 4 cycles of therapy.    Labs done 03/24/2018 reviewed and showed WBC 4.8 HB 10.1 plts 396,000.  Chemistries WNL with K+ 3.5 Cr 0.53 and normal LFTs.  TSH in 02/2018 shows TSH of 0.434.    Pet scan done 03/12/2018 reviewed and showed  IMPRESSION: 1. Mixed response to therapy. 2. Dominant LEFT suprahilar mass extending to the mediastinum not changed in size or metabolic activity and remains intense hypermetabolic. 3. Bilateral pulmonary nodules are decreased in size and metabolic activity. 4. New bilateral small hypermetabolic axial lymph nodes with mild activity of unclear significance. 5. Clearly new metastatic bone lesion in the RIGHT iliac bone, LEFT femur as posterior LEFT eleventh rib. 6. Probable muscle metastasis in the LEFT gluteus muscle and potential LEFT diaphragm adjacent stomach.  Based on the mixed response with new areas showing bone lesions that are new, I have discussed option of change in therapy to Tecentriq 1200 mg IV every 3 weeks with Avastin 15 mg/kg every 3 weeks based on results  Of IMpower trial that showed increase in PFS with regimen.  Side effects of medications reviewed which include but are not limited to: Bleeding Immune-mediated Pneumonitis  Immune-mediated Colitis Immune-mediated Hepatitis Immune-medicated Nephritis and Renal Dysfunction  Immune-mediated Hypothyroidism and Hyperthyroidism  Goals of therapy are disease control.    Pt will undergo chemotherapy teaching.  She will RTC in 3  weeks prior to C2.  Will hold Avastin with first cycle due to recent stereotactic RT.  All questions answered and pt expressed understanding of information presented.    2.  Brain lesion.  MRI of brain done due to advanced lung cancer showed impression:   1. 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  Pt has been seen by RT for evaluation based on the brain lesion noted on MRI and due to PET findings.  Based on RT review, the patient has findings consistent with multiple pulmonary metastases.  She also had a 0.6 cm extra-axial mass found on brain MRI scan.  This finding appears to represent a meningioma.  After reviewing this study, Dr. Lisbeth Renshaw felt  it is reasonable to obtain a repeat MRI scan in about 8 weeks for follow-up.   If this remains stable as expected, then further brain imaging could be spaced out further.    Pt had MRI of brain done 02/27/2018 that was reviewed and showed  IMPRESSION: 1. Stable small right frontal meningioma 2. New 5 mm enhancing nodule right inferior cerebellar vermis. Given history of lung cancer, this could represent metastatic disease. No other enhancing metastatic deposits. Short term imaging follow-up recommended for further evaluation.  She was referred back to Dr. Lisbeth Renshaw and has undergone stereotactic radiation with 20 Gy in 1 fraction on 03/13/2018.  She is planned for follow-up with RT in 04/2018.    3.  Bone lesions.  Pt will be treated with Xgeva 120 mg sq  every 4 weeks to prevent skeletal related events.    4.  HTN.  BP is 156/58.  Follow-up with PCP.    5.  Chest wall discomfort.  Pt reports this is improved.  CTA negative for PE.  Likely due to advanced lung cancer.   Pt previously prescribed Vicodin.    6.  Anorexia.  Will ask for nutrition evaluation.    25 minutes spent with review of records, counseling and coordination of care.    Interval History:  Historical data obtained from note dated  11/18/2017:  78 yr old female presented to ED due to shortness of breath.  She has a long smoking history.  She underwent CTA of the chest on 11/16/2017 with findings of  IMPRESSION: 1. 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 There are multiple adjacent nodules within the left upper lobe as well as scattered nodules throughout the lungs bilaterally concerning for metastatic disease. Left hilar adenopathy concerning for metastatic disease. 2. Indeterminate left adrenal nodule. 3. No evidence for acute pulmonary embolus. 4. Aortic Atherosclerosis (ICD10-I70.0) and Emphysema (ICD10-J43.9).  Current Status:  Pt is seen today for follow-up.  She is here to go over PET scan.  Pt was recently treated with RT by Dr. Lisbeth Renshaw.      Lung cancer ([place])   12/23/2017 Initial Diagnosis    Lung cancer ([place])    12/29/2017 - 03/23/2018 Chemotherapy    The patient had palonosetron (ALOXI) injection 0.25 mg, 0.25 mg, Intravenous,  Once, 4 of 6 cycles Administration: 0.25 mg (12/29/2017), 0.25 mg (02/10/2018), 0.25 mg (03/03/2018), 0.25 mg (01/20/2018) PEMEtrexed (ALIMTA) 900 mg in sodium chloride 0.9 % 100 mL chemo infusion, 515 mg/m2 = 875 mg, Intravenous,  Once, 4 of 6 cycles Administration: 900 mg (12/29/2017), 900 mg (02/10/2018),  900 mg (03/03/2018), 900 mg (01/20/2018) CARBOplatin (PARAPLATIN) 360 mg in sodium chloride 0.9 % 250 mL chemo infusion, 360 mg (100 % of original dose 356.5 mg), Intravenous,  Once, 4 of 6 cycles Dose modification:   (original dose 356.5 mg, Cycle 1),   (original dose 356.5 mg, Cycle 3),   (original dose 356.5 mg, Cycle 4),   (original dose 356.5 mg, Cycle 2) Administration: 360 mg (12/29/2017), 360 mg (02/10/2018), 360 mg (03/03/2018), 360 mg (01/20/2018) pembrolizumab (KEYTRUDA) 200 mg in sodium chloride 0.9 % 50 mL chemo infusion, 200 mg, Intravenous, Once, 4 of 6 cycles Administration: 200 mg (12/29/2017), 200 mg (02/10/2018), 200 mg (03/03/2018), 200 mg  (01/20/2018) fosaprepitant (EMEND) 150 mg, dexamethasone (DECADRON) 12 mg in sodium chloride 0.9 % 145 mL IVPB, , Intravenous,  Once, 4 of 6 cycles Administration:  (12/29/2017),  (02/10/2018),  (03/03/2018),  (01/20/2018)  for chemotherapy treatment.     03/25/2018 -  Chemotherapy    The patient had bevacizumab (AVASTIN) 875 mg in sodium chloride 0.9 % 100 mL chemo infusion, 15 mg/kg, Intravenous,  Once, 0 of 5 cycles atezolizumab (TECENTRIQ) 1,200 mg in sodium chloride 0.9 % 250 mL chemo infusion, 1,200 mg, Intravenous, Once, 0 of 6 cycles  for chemotherapy treatment.      Brain metastasis ([place])   03/10/2018 Initial Diagnosis    Brain metastasis ([place])    03/25/2018 -  Chemotherapy    The patient had bevacizumab (AVASTIN) 875 mg in sodium chloride 0.9 % 100 mL chemo infusion, 15 mg/kg, Intravenous,  Once, 0 of 5 cycles atezolizumab (TECENTRIQ) 1,200 mg in sodium chloride 0.9 % 250 mL chemo infusion, 1,200 mg, Intravenous, Once, 0 of 6 cycles  for chemotherapy treatment.       Problem List Patient Active Problem List   Diagnosis Date Noted  . Metastasis to bone ([place]) [C79.51] 03/24/2018  . Brain metastasis ([place]) [C79.31] 03/10/2018  . Lung cancer ([place]) [C34.90] 12/23/2017  . Special screening for malignant neoplasms, colon [Z12.11]     Past Medical History Past Medical History:  Diagnosis Date  . Anemia   . Borderline diabetes   . Dyspnea   . Hyperlipidemia   . Hypertension   . Lung cancer ([place]) 12/23/2017  . Metastasis to bone ([place]) 03/24/2018  . Pneumonia   . Pre-diabetes     Past Surgical History Past Surgical History:  Procedure Laterality Date  . ABDOMINAL HYSTERECTOMY    . APPENDECTOMY    . COLONOSCOPY N/A 07/16/2017   Procedure: COLONOSCOPY;  Surgeon: Danie Binder, MD;  Location[facility];  Service: Endoscopy;  Laterality: N/A;  10:30  . IR IMAGING GUIDED PORT INSERTION  12/24/2017  . POLYPECTOMY  07/16/2017   Procedure: POLYPECTOMY;  Surgeon: Danie Binder, MD;  Location: [facility];  Service: Endoscopy;;  cecal x2;hepatic flexurex5; splenic x1;descending x2  . VIDEO BRONCHOSCOPY WITH ENDOBRONCHIAL ULTRASOUND N/A 12/17/2017   Procedure: VIDEO BRONCHOSCOPY WITH ENDOBRONCHIAL ULTRASOUND;  Surgeon: Melrose Nakayama, MD;  Location: [facility] OR;  Service: Thoracic;  Laterality: N/A;    Family History Family History  Problem Relation Age of Onset  . Hypertension Mother   . Hypertension Father   . Colon cancer Neg Hx   . Colon polyps Neg Hx      Social History  reports that she quit smoking about 5 years ago. Her smoking use included cigarettes. She has a 40.00 pack-year smoking history. She has never used smokeless tobacco. She reports that she does not drink  alcohol or use drugs.  Medications  Current Outpatient Medications:  .  albuterol (PROVENTIL HFA;VENTOLIN HFA) 108 (90 Base) MCG/ACT inhaler, Inhale 2 puffs into the lungs every 4 (four) hours as needed for wheezing or shortness of breath (or coughing)., Disp: 1 Inhaler, Rfl: 0 .  amLODipine (NORVASC) 5 MG tablet, Take 5 mg by mouth every evening. , Disp: , Rfl:  .  aspirin EC 81 MG tablet, Take 81 mg by mouth every evening. , Disp: , Rfl:  .  folic acid (FOLVITE) 1 MG tablet, Take 1 tablet (1 mg total) by mouth daily. Start 5-7 days before Alimta chemotherapy. Continue until 21 days after Alimta completed., Disp: 100 tablet, Rfl: 3 .  HYDROcodone-acetaminophen (NORCO/VICODIN) 5-325 MG tablet, Take 1 tablet by mouth every 4 (four) hours as needed for moderate pain. 1 or 2 tabs PO q4 hours prn pain, Disp: 90 tablet, Rfl: 0 .  lisinopril (PRINIVIL,ZESTRIL) 5 MG tablet, Take 5 mg by mouth every evening. , Disp: , Rfl:  .  simvastatin (ZOCOR) 40 MG tablet, Take 40 mg by mouth every evening. , Disp: , Rfl:  .  lidocaine-prilocaine (EMLA) cream, Apply to affected area once (Patient not taking: Reported on 03/24/2018), Disp: 30 g, Rfl: 3 .  prochlorperazine (COMPAZINE) 10 MG tablet, Take 1  tablet (10 mg total) by mouth every 6 (six) hours as needed (Nausea or vomiting). (Patient not taking: Reported on 02/10/2018), Disp: 30 tablet, Rfl: 1  Allergies Codeine  Review of Systems Review of Systems - Oncology ROS negative   Physical Exam  Vitals Wt Readings from Last 3 Encounters:  03/24/18 130 lb 6.4 oz (59.1 kg)  03/10/18 127 lb (57.6 kg)  03/03/18 133 lb 9 oz (60.6 kg)   Temp Readings from Last 3 Encounters:  03/24/18 98.1 F (36.7 C) (Oral)  03/13/18 98.7 F (37.1 C) (Oral)  03/10/18 98.1 F (36.7 C) (Oral)   BP Readings from Last 3 Encounters:  03/24/18 (!) 156/58  03/13/18 (!) 149/66  03/10/18 (!) 153/59   Pulse Readings from Last 3 Encounters:  03/24/18 80  03/13/18 64  03/10/18 72   Constitutional: Well-developed, well-nourished, and in no distress.   HENT: Head: Normocephalic and atraumatic.  Mouth/Throat: No oropharyngeal exudate. Mucosa moist. Eyes: Pupils are equal, round, and reactive to light. Conjunctivae are normal. No scleral icterus.  Neck: Normal range of motion. Neck supple. No JVD present.  Cardiovascular: Normal rate, regular rhythm and normal heart sounds.  Exam reveals no gallop and no friction rub.   No murmur heard. Pulmonary/Chest: Effort normal and breath sounds normal. No respiratory distress. No wheezes.No rales.  Abdominal: Soft. Bowel sounds are normal. No distension. There is no tenderness. There is no guarding.  Musculoskeletal: No edema or tenderness.  Lymphadenopathy: No cervical, axillary or supraclavicular adenopathy.  Neurological: Alert and oriented to person, place, and time. No cranial nerve deficit.  Skin: Skin is warm and dry. No rash noted. No erythema. No pallor.  Psychiatric: Affect and judgment normal.   Labs Appointment on 03/24/2018  Component Date Value Ref Range Status  . WBC 03/24/2018 4.8  4.0 - 10.5 K/uL Final  . RBC 03/24/2018 4.12  3.87 - 5.11 MIL/uL Final  . Hemoglobin 03/24/2018 10.1* 12.0 -  15.0 g/dL Final  . HCT 03/24/2018 34.8* 36.0 - 46.0 % Final  . MCV 03/24/2018 84.5  80.0 - 100.0 fL Final  . MCH 03/24/2018 24.5* 26.0 - 34.0 pg Final  . MCHC 03/24/2018 29.0* 30.0 - 36.0  g/dL Final  . RDW 03/24/2018 16.1* 11.5 - 15.5 % Final  . Platelets 03/24/2018 396  150 - 400 K/uL Final  . nRBC 03/24/2018 0.0  0.0 - 0.2 % Final  . Neutrophils Relative % 03/24/2018 73  % Final  . Neutro Abs 03/24/2018 3.5  1.7 - 7.7 K/uL Final  . Lymphocytes Relative 03/24/2018 9  % Final  . Lymphs Abs 03/24/2018 0.4* 0.7 - 4.0 K/uL Final  . Monocytes Relative 03/24/2018 16  % Final  . Monocytes Absolute 03/24/2018 0.8  0.1 - 1.0 K/uL Final  . Eosinophils Relative 03/24/2018 1  % Final  . Eosinophils Absolute 03/24/2018 0.1  0.0 - 0.5 K/uL Final  . Basophils Relative 03/24/2018 1  % Final  . Basophils Absolute 03/24/2018 0.0  0.0 - 0.1 K/uL Final  . Immature Granulocytes 03/24/2018 0  % Final  . Abs Immature Granulocytes 03/24/2018 0.02  0.00 - 0.07 K/uL Final   Performed at [facility], [address] [place], [place] [address]  . Sodium 03/24/2018 136  135 - 145 mmol/L Final  . Potassium 03/24/2018 3.5  3.5 - 5.1 mmol/L Final  . Chloride 03/24/2018 105  98 - 111 mmol/L Final  . CO2 03/24/2018 24  22 - 32 mmol/L Final  . Glucose, Bld 03/24/2018 106* 70 - 99 mg/dL Final  . BUN 03/24/2018 13  8 - 23 mg/dL Final  . Creatinine, Ser 03/24/2018 0.53  0.44 - 1.00 mg/dL Final  . Calcium 03/24/2018 8.8* 8.9 - 10.3 mg/dL Final  . Total Protein 03/24/2018 7.0  6.5 - 8.1 g/dL Final  . Albumin 03/24/2018 3.2* 3.5 - 5.0 g/dL Final  . AST 03/24/2018 17  15 - 41 U/L Final  . ALT 03/24/2018 <5  0 - 44 U/L Final  . Alkaline Phosphatase 03/24/2018 45  38 - 126 U/L Final  . Total Bilirubin 03/24/2018 0.7  0.3 - 1.2 mg/dL Final  . GFR calc non Af Amer 03/24/2018 >60  >60 mL/min Final  . GFR calc Af Amer 03/24/2018 >60  >60 mL/min Final  . Anion gap 03/24/2018 7  5 - 15 Final   Performed at [facility],  [address], [place], [place] [address]  . LDH 03/24/2018 220* 98 - 192 U/L Final   Performed at [facility], [address] [place], [place] [address]     Pathology No orders of the defined types were placed in this encounter.      Zoila Shutter MD

## 2018-03-30 MED ORDER — DEXAMETHASONE 4 MG PO TABS: ORAL_TABLET | ORAL | 1 refills | Status: DC

## 2018-03-30 MED ORDER — ONDANSETRON HCL 8 MG PO TABS: 8.0000 mg | ORAL_TABLET | Freq: Two times a day (BID) | ORAL | 1 refills | Status: DC | PRN

## 2018-03-31 ENCOUNTER — Other Ambulatory Visit (HOSPITAL_COMMUNITY): Payer: Self-pay | Admitting: Internal Medicine

## 2018-03-31 ENCOUNTER — Inpatient Hospital Stay (HOSPITAL_COMMUNITY): Payer: Medicare HMO

## 2018-03-31 ENCOUNTER — Other Ambulatory Visit: Payer: Self-pay

## 2018-03-31 ENCOUNTER — Inpatient Hospital Stay (HOSPITAL_BASED_OUTPATIENT_CLINIC_OR_DEPARTMENT_OTHER): Payer: Medicare HMO | Admitting: Internal Medicine

## 2018-03-31 ENCOUNTER — Ambulatory Visit (HOSPITAL_COMMUNITY): Payer: Medicare HMO

## 2018-03-31 ENCOUNTER — Other Ambulatory Visit (HOSPITAL_COMMUNITY): Payer: Medicare HMO

## 2018-03-31 ENCOUNTER — Inpatient Hospital Stay (HOSPITAL_COMMUNITY): Payer: Medicare HMO | Attending: Hematology | Admitting: Dietician

## 2018-03-31 ENCOUNTER — Ambulatory Visit (HOSPITAL_COMMUNITY): Payer: Medicare HMO | Admitting: Internal Medicine

## 2018-03-31 ENCOUNTER — Encounter (HOSPITAL_COMMUNITY): Payer: Medicare HMO | Admitting: Dietician

## 2018-03-31 ENCOUNTER — Encounter (HOSPITAL_COMMUNITY): Payer: Self-pay | Admitting: Internal Medicine

## 2018-03-31 VITALS — BP 162/63 | Temp 98.1°F | Resp 16 | Wt 137.6 lb

## 2018-03-31 DIAGNOSIS — C7801 Secondary malignant neoplasm of right lung: Secondary | ICD-10-CM

## 2018-03-31 DIAGNOSIS — E785 Hyperlipidemia, unspecified: Secondary | ICD-10-CM

## 2018-03-31 DIAGNOSIS — C3480 Malignant neoplasm of overlapping sites of unspecified bronchus and lung: Secondary | ICD-10-CM

## 2018-03-31 DIAGNOSIS — Z5111 Encounter for antineoplastic chemotherapy: Secondary | ICD-10-CM | POA: Diagnosis not present

## 2018-03-31 DIAGNOSIS — Z5112 Encounter for antineoplastic immunotherapy: Secondary | ICD-10-CM

## 2018-03-31 DIAGNOSIS — I700 Atherosclerosis of aorta: Secondary | ICD-10-CM

## 2018-03-31 DIAGNOSIS — I10 Essential (primary) hypertension: Secondary | ICD-10-CM

## 2018-03-31 DIAGNOSIS — Z79899 Other long term (current) drug therapy: Secondary | ICD-10-CM

## 2018-03-31 DIAGNOSIS — Z8673 Personal history of transient ischemic attack (TIA), and cerebral infarction without residual deficits: Secondary | ICD-10-CM

## 2018-03-31 DIAGNOSIS — K759 Inflammatory liver disease, unspecified: Secondary | ICD-10-CM

## 2018-03-31 DIAGNOSIS — D649 Anemia, unspecified: Secondary | ICD-10-CM

## 2018-03-31 DIAGNOSIS — C7931 Secondary malignant neoplasm of brain: Secondary | ICD-10-CM

## 2018-03-31 DIAGNOSIS — E041 Nontoxic single thyroid nodule: Secondary | ICD-10-CM

## 2018-03-31 DIAGNOSIS — C7951 Secondary malignant neoplasm of bone: Secondary | ICD-10-CM | POA: Diagnosis not present

## 2018-03-31 DIAGNOSIS — R634 Abnormal weight loss: Secondary | ICD-10-CM

## 2018-03-31 DIAGNOSIS — D320 Benign neoplasm of cerebral meninges: Secondary | ICD-10-CM | POA: Diagnosis not present

## 2018-03-31 DIAGNOSIS — Z87891 Personal history of nicotine dependence: Secondary | ICD-10-CM

## 2018-03-31 DIAGNOSIS — R630 Anorexia: Secondary | ICD-10-CM

## 2018-03-31 DIAGNOSIS — J439 Emphysema, unspecified: Secondary | ICD-10-CM

## 2018-03-31 DIAGNOSIS — C3402 Malignant neoplasm of left main bronchus: Secondary | ICD-10-CM | POA: Diagnosis not present

## 2018-03-31 DIAGNOSIS — K529 Noninfective gastroenteritis and colitis, unspecified: Secondary | ICD-10-CM

## 2018-03-31 DIAGNOSIS — C3492 Malignant neoplasm of unspecified part of left bronchus or lung: Secondary | ICD-10-CM

## 2018-03-31 DIAGNOSIS — Z7982 Long term (current) use of aspirin: Secondary | ICD-10-CM

## 2018-03-31 LAB — COMPREHENSIVE METABOLIC PANEL
ALT: 10 U/L (ref 0–44)
AST: 18 U/L (ref 15–41)
Albumin: 3 g/dL — ABNORMAL LOW (ref 3.5–5.0)
Alkaline Phosphatase: 51 U/L (ref 38–126)
Anion gap: 7 (ref 5–15)
BUN: 11 mg/dL (ref 8–23)
CALCIUM: 8.7 mg/dL — AB (ref 8.9–10.3)
CO2: 25 mmol/L (ref 22–32)
Chloride: 104 mmol/L (ref 98–111)
Creatinine, Ser: 0.42 mg/dL — ABNORMAL LOW (ref 0.44–1.00)
GFR calc Af Amer: >60 mL/min (ref >60)
Glucose, Bld: 101 mg/dL — ABNORMAL HIGH (ref 70–99)
Potassium: 3.6 mmol/L (ref 3.5–5.1)
Sodium: 136 mmol/L (ref 135–145)
Total Bilirubin: 0.5 mg/dL (ref 0.3–1.2)
Total Protein: 6.7 g/dL (ref 6.5–8.1)

## 2018-03-31 LAB — CBC WITH DIFFERENTIAL/PLATELET
Abs Immature Granulocytes: 0.03 10*3/uL (ref 0.00–0.07)
Basophils Absolute: 0 10*3/uL (ref 0.0–0.1)
Basophils Relative: 1 %
Eosinophils Absolute: 0.2 10*3/uL (ref 0.0–0.5)
Eosinophils Relative: 3 %
HCT: 33.6 % — ABNORMAL LOW (ref 36.0–46.0)
HEMOGLOBIN: 9.4 g/dL — AB (ref 12.0–15.0)
Immature Granulocytes: 1 %
Lymphocytes Relative: 10 %
Lymphs Abs: 0.6 10*3/uL — ABNORMAL LOW (ref 0.7–4.0)
MCH: 23.3 pg — AB (ref 26.0–34.0)
MCHC: 28 g/dL — ABNORMAL LOW (ref 30.0–36.0)
MCV: 83.4 fL (ref 80.0–100.0)
MONO ABS: 0.9 10*3/uL (ref 0.1–1.0)
MONOS PCT: 15 %
NRBC: 0 % (ref 0.0–0.2)
Neutro Abs: 4.3 10*3/uL (ref 1.7–7.7)
Neutrophils Relative %: 70 %
Platelets: 307 10*3/uL (ref 150–400)
RBC: 4.03 MIL/uL (ref 3.87–5.11)
RDW: 16 % — ABNORMAL HIGH (ref 11.5–15.5)
WBC: 6.1 10*3/uL (ref 4.0–10.5)

## 2018-03-31 MED ORDER — SODIUM CHLORIDE 0.9 % IV SOLN
Freq: Once | INTRAVENOUS | Status: AC
  Administered 2018-03-31: 11:00:00 via INTRAVENOUS
  Filled 2018-03-31: qty 5

## 2018-03-31 MED ORDER — PALONOSETRON HCL INJECTION 0.25 MG/5ML
0.2500 mg | Freq: Once | INTRAVENOUS | Status: AC
  Administered 2018-03-31: 0.25 mg via INTRAVENOUS

## 2018-03-31 MED ORDER — SODIUM CHLORIDE 0.9 % IV SOLN
200.0000 mg | Freq: Once | INTRAVENOUS | Status: AC
  Administered 2018-03-31: 200 mg via INTRAVENOUS
  Filled 2018-03-31: qty 8

## 2018-03-31 MED ORDER — SODIUM CHLORIDE 0.9% FLUSH
10.0000 mL | INTRAVENOUS | Status: DC | PRN
  Administered 2018-03-31: 10 mL
  Filled 2018-03-31: qty 10

## 2018-03-31 MED ORDER — ZOLEDRONIC ACID 4 MG/100ML IV SOLN
4.0000 mg | Freq: Once | INTRAVENOUS | Status: AC
  Administered 2018-03-31: 4 mg via INTRAVENOUS
  Filled 2018-03-31: qty 100

## 2018-03-31 MED ORDER — PALONOSETRON HCL INJECTION 0.25 MG/5ML
INTRAVENOUS | Status: AC
  Filled 2018-03-31: qty 5

## 2018-03-31 MED ORDER — HEPARIN SOD (PORK) LOCK FLUSH 100 UNIT/ML IV SOLN
500.0000 [IU] | Freq: Once | INTRAVENOUS | Status: AC | PRN
  Administered 2018-03-31: 500 [IU]

## 2018-03-31 MED ORDER — SODIUM CHLORIDE 0.9 % IV SOLN
900.0000 mg | Freq: Once | INTRAVENOUS | Status: AC
  Administered 2018-03-31: 900 mg via INTRAVENOUS
  Filled 2018-03-31: qty 16

## 2018-03-31 MED ORDER — SODIUM CHLORIDE 0.9 % IV SOLN
348.5000 mg | Freq: Once | INTRAVENOUS | Status: AC
  Administered 2018-03-31: 350 mg via INTRAVENOUS
  Filled 2018-03-31: qty 35

## 2018-03-31 MED ORDER — SODIUM CHLORIDE 0.9 % IV SOLN
Freq: Once | INTRAVENOUS | Status: AC
  Administered 2018-03-31: 10:00:00 via INTRAVENOUS

## 2018-03-31 MED ORDER — HYDROCODONE-ACETAMINOPHEN 5-325 MG PO TABS: 1.0000 | ORAL_TABLET | ORAL | 0 refills | Status: DC | PRN

## 2018-03-31 NOTE — Progress Notes (Signed)
Diagnosis Adenocarcinoma of left lung, stage 4 (HCC) - Plan: CBC with Differential, Comprehensive metabolic panel, Lactate dehydrogenase, T4 AND TSH  Staging Cancer Staging No matching staging information was found for the patient.  Assessment and Plan:   1.  Stage IV NSCLC.  Pt had CTA of the chest on 11/16/2017 with findings of  IMPRESSION: 1. 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 There are multiple adjacent nodules within the left upper lobe as well as scattered nodules throughout the lungs bilaterally concerning for metastatic disease. Left hilar adenopathy concerning for metastatic disease. 2. Indeterminate left adrenal nodule. 3. No evidence for acute pulmonary embolus. 4. Aortic Atherosclerosis (ICD10-I70.0) and Emphysema (ICD10-J43.9).  She reports weight loss of 10 pounds over past month.    Pet scan done 12/01/2017 reviewed and showed   IMPRESSION: 1. Hypermetabolic LEFT hilar mass which encroaches upon the AP window. 2. Nodularity in the anterior LEFT upper lobe and superior LEFT upper lobe consistent with primary or metastatic lung cancer. 3. Hypermetabolic metastatic pulmonary nodule within the RIGHT upper lobe. 4. 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 5. 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 6. No evidence of metastatic lung carcinoma outside the thorax.  MRI of brain done 12/01/2017 reviewed and showed  IMPRESSION: 1. 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 2. No other enhancing lesions to suggest metastatic disease. 3. No acute intracranial abnormality. 4. Mild chronic small vessel ischemic disease. 5. Chronic cerebellar infarcts.  Pt had biopsy done of LUL on 12/17/2017 with  pathology returning as adenocarcinoma.  EBUS of level 11 node returned as NSCLC.    Pt has diagnosis of advanced NSCLC.  She was treated with standard recommendation based on adenocarcinoma histology of [place] dosed at 200 mg IV with Carboplatin AUC 5 with Alimta 500 mg/m2 every 3 weeks based on results of [place] study that showed an increase in PFS and overall survival with the addition of Keytruda to chemotherapy in lung cancer.   Foundation testing was unable to be done due to insufficient sample for analysis.   She had PDL1 Testing done 12/17/2017 with RPS of 10%.    Pt has completed 4 cycles of therapy.    Labs done 03/24/2018 reviewed and showed WBC 4.8 HB 10.1 plts 396,000.  Chemistries WNL with K+ 3.5 Cr 0.53 and normal LFTs.  TSH in 02/2018 shows TSH of 0.434.    Pet scan done 03/12/2018 reviewed and showed  IMPRESSION: 1. Mixed response to therapy. 2. Dominant LEFT suprahilar mass extending to the mediastinum not changed in size or metabolic activity and remains intense hypermetabolic. 3. Bilateral pulmonary nodules are decreased in size and metabolic activity. 4. New bilateral small hypermetabolic axial lymph nodes with mild activity of unclear significance. 5. Clearly new metastatic bone lesion in the RIGHT iliac bone, LEFT femur as posterior LEFT eleventh rib. 6. Probable muscle metastasis in the LEFT gluteus muscle and potential LEFT diaphragm adjacent stomach.  Based on the mixed response with new areas showing bone lesions that are new, I have discussed potential option of change in therapy.  Have spoken with insurance company who will not approve other regimens except current therapy or Taxotere and  Ramicurimab.  Due to stable chest findings will continue current regimen for 2 more cycles.  Will discuss therapy  options again with insurance and consider repeat imaging in 2 months. Goals of therapy are disease control.    All questions answered and pt expressed understanding  of information presented.    2.  Brain lesion.  MRI of brain done due to advanced lung cancer showed impression:   1. 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  Pt has been seen by RT for evaluation based on the brain lesion noted on MRI and due to PET findings.  Based on RT review, the patient has findings consistent with multiple pulmonary metastases.  She also had a 0.6 cm extra-axial mass found on brain MRI scan.  This finding appears to represent a meningioma.  After reviewing this study, Dr. Lisbeth Renshaw felt  it is reasonable to obtain a repeat MRI scan in about 8 weeks for follow-up.   If this remains stable as expected, then further brain imaging could be spaced out further.    Pt had MRI of brain done 02/27/2018 that was reviewed and showed  IMPRESSION: 1. Stable small right frontal meningioma 2. New 5 mm enhancing nodule right inferior cerebellar vermis. Given history of lung cancer, this could represent metastatic disease. No other enhancing metastatic deposits. Short term imaging follow-up recommended for further evaluation.  She was referred back to Dr. Lisbeth Renshaw and has undergone stereotactic radiation with 20 Gy in 1 fraction on 03/13/2018.  She is planned for follow-up with RT in 04/2018.    3.  Bone lesions.  Pt will be treated with zometa 4 mg IV every 3 weeks to prevent skeletal related events.    4.  HTN.  BP is 156/58.  Follow-up with PCP.    5.  Chest wall discomfort.  Pt reports this is improved.  CTA negative for PE.  Likely due to advanced lung cancer.   Pt requesting rX for Vicodin.  RX sent to pharmacy.    6.  Anorexia.  Will ask for nutrition evaluation.    25 minutes spent with review of records, counseling and coordination of care.    Interval History:  Historical data obtained from note dated 11/18/2017:  78 yr old female presented to ED due to shortness of breath.  She has a long smoking history.  She underwent CTA of  the chest on 11/16/2017 with findings of  IMPRESSION: 1. 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 There are multiple adjacent nodules within the left upper lobe as well as scattered nodules throughout the lungs bilaterally concerning for metastatic disease. Left hilar adenopathy concerning for metastatic disease. 2. Indeterminate left adrenal nodule. 3. No evidence for acute pulmonary embolus. 4. Aortic Atherosclerosis (ICD10-I70.0) and Emphysema (ICD10-J43.9).  Current Status:  Pt is seen today for follow-up.  She is here for chemotherapy.      Lung cancer ([place])   12/23/2017 Initial Diagnosis    Lung cancer ([place])    12/29/2017 - 03/23/2018 Chemotherapy    The patient had palonosetron (ALOXI) injection 0.25 mg, 0.25 mg, Intravenous,  Once, 4 of 6 cycles Administration: 0.25 mg (12/29/2017), 0.25 mg (02/10/2018), 0.25 mg (03/03/2018), 0.25 mg (01/20/2018) PEMEtrexed (ALIMTA) 900 mg in sodium chloride 0.9 % 100 mL chemo infusion, 515 mg/m2 = 875 mg, Intravenous,  Once, 4 of 6 cycles Administration: 900 mg (12/29/2017), 900 mg (02/10/2018), 900 mg (03/03/2018), 900 mg (01/20/2018) CARBOplatin (PARAPLATIN) 360 mg in sodium chloride 0.9 % 250 mL chemo infusion, 360 mg (100 % of original dose 356.5 mg), Intravenous,  Once, 4 of 6 cycles Dose modification:   (original dose 356.5 mg, Cycle 1),   (original dose 356.5 mg, Cycle 3),   (original dose 356.5 mg, Cycle 4),   (original dose 356.5 mg, Cycle 2) Administration: 360 mg (12/29/2017), 360 mg (02/10/2018), 360 mg (03/03/2018), 360 mg (01/20/2018) pembrolizumab (KEYTRUDA) 200 mg in sodium chloride 0.9 % 50 mL chemo infusion, 200 mg, Intravenous, Once, 4 of 6 cycles Administration: 200 mg (12/29/2017), 200 mg (02/10/2018), 200 mg (03/03/2018), 200 mg (01/20/2018) fosaprepitant (EMEND) 150 mg, dexamethasone (DECADRON) 12 mg in sodium chloride 0.9 % 145 mL IVPB, , Intravenous,  Once, 4 of 6 cycles Administration:  (12/29/2017),   (02/10/2018),  (03/03/2018),  (01/20/2018)  for chemotherapy treatment.     03/31/2018 -  Chemotherapy    The patient had palonosetron (ALOXI) injection 0.25 mg, 0.25 mg, Intravenous,  Once, 1 of 2 cycles Administration: 0.25 mg (03/31/2018) PEMEtrexed (ALIMTA) 900 mg in sodium chloride 0.9 % 100 mL chemo infusion, 850 mg, Intravenous,  Once, 1 of 2 cycles Administration: 900 mg (03/31/2018) CARBOplatin (PARAPLATIN) 350 mg in sodium chloride 0.9 % 250 mL chemo infusion, 350 mg (100 % of original dose 348.5 mg), Intravenous,  Once, 1 of 2 cycles Dose modification:   (original dose 348.5 mg, Cycle 1) Administration: 350 mg (03/31/2018) pembrolizumab (KEYTRUDA) 200 mg in sodium chloride 0.9 % 50 mL chemo infusion, 200 mg, Intravenous, Once, 1 of 2 cycles Administration: 200 mg (03/31/2018) fosaprepitant (EMEND) 150 mg, dexamethasone (DECADRON) 12 mg in sodium chloride 0.9 % 145 mL IVPB, , Intravenous,  Once, 1 of 2 cycles Administration:  (03/31/2018)  for chemotherapy treatment.      Brain metastasis ([place])   03/10/2018 Initial Diagnosis    Brain metastasis ([place])    03/31/2018 -  Chemotherapy    The patient had palonosetron (ALOXI) injection 0.25 mg, 0.25 mg, Intravenous,  Once, 1 of 2 cycles Administration: 0.25 mg (03/31/2018) PEMEtrexed (ALIMTA) 900 mg in sodium chloride 0.9 % 100 mL chemo infusion, 850 mg, Intravenous,  Once, 1 of 2 cycles Administration: 900 mg (03/31/2018) CARBOplatin (PARAPLATIN) 350 mg in sodium chloride 0.9 % 250 mL chemo infusion, 350 mg (100 % of original dose 348.5 mg), Intravenous,  Once, 1 of 2 cycles Dose modification:   (original dose 348.5 mg, Cycle 1) Administration: 350 mg (03/31/2018) pembrolizumab (KEYTRUDA) 200 mg in sodium chloride 0.9 % 50 mL chemo infusion, 200 mg, Intravenous, Once, 1 of 2 cycles Administration: 200 mg (03/31/2018) fosaprepitant (EMEND) 150 mg, dexamethasone (DECADRON) 12 mg in sodium chloride 0.9 % 145 mL IVPB, , Intravenous,  Once, 1 of 2  cycles Administration:  (03/31/2018)  for chemotherapy treatment.       Problem List Patient Active Problem List   Diagnosis Date Noted  . Metastasis to bone ([place]) [C79.51] 03/24/2018  . Brain metastasis ([place]) [C79.31] 03/10/2018  . Lung cancer ([place]) [C34.90] 12/23/2017  . Special screening for malignant neoplasms, colon [Z12.11]     Past Medical History Past Medical History:  Diagnosis Date  . Anemia   . Borderline diabetes   . Dyspnea   . Hyperlipidemia   . Hypertension   . Lung cancer ([place]) 12/23/2017  . Metastasis to bone ([place]) 03/24/2018  . Pneumonia   . Pre-diabetes     Past Surgical History Past Surgical History:  Procedure Laterality Date  . ABDOMINAL HYSTERECTOMY    . APPENDECTOMY    . COLONOSCOPY N/A 07/16/2017   Procedure: COLONOSCOPY;  Surgeon: Danie Binder, MD;  Location: [facility];  Service: Endoscopy;  Laterality: N/A;  10:30  . IR IMAGING GUIDED PORT INSERTION  12/24/2017  . POLYPECTOMY  07/16/2017   Procedure: POLYPECTOMY;  Surgeon: Danie Binder, MD;  Location: [facility];  Service: Endoscopy;;  cecal x2;hepatic flexurex5; splenic x1;descending x2  . VIDEO BRONCHOSCOPY WITH ENDOBRONCHIAL ULTRASOUND N/A 12/17/2017   Procedure: VIDEO BRONCHOSCOPY WITH ENDOBRONCHIAL ULTRASOUND;  Surgeon: Melrose Nakayama, MD;  Location: [facility] OR;  Service: Thoracic;  Laterality: N/A;    Family History Family History  Problem Relation Age of Onset  . Hypertension Mother   . Hypertension Father   . Colon cancer Neg Hx   . Colon polyps Neg Hx      Social History  reports that she quit smoking about 5 years ago. Her smoking use included cigarettes. She has a 40.00 pack-year smoking history. She has never used smokeless tobacco. She reports that she does not drink alcohol or use drugs.  Medications  Current Outpatient Medications:  .  albuterol (PROVENTIL HFA;VENTOLIN HFA) 108 (90 Base) MCG/ACT inhaler, Inhale 2 puffs into the lungs every 4 (four) hours  as needed for wheezing or shortness of breath (or coughing)., Disp: 1 Inhaler, Rfl: 0 .  amLODipine (NORVASC) 5 MG tablet, Take 5 mg by mouth every evening. , Disp: , Rfl:  .  aspirin EC 81 MG tablet, Take 81 mg by mouth every evening. , Disp: , Rfl:  .  dexamethasone (DECADRON) 4 MG tablet, Take 1 tab two times a day the day before Alimta chemo, then take 2 tabs once a day for 3 days starting the day after chemo., Disp: 30 tablet, Rfl: 1 .  folic acid (FOLVITE) 1 MG tablet, Take 1 tablet (1 mg total) by mouth daily. Start 5-7 days before Alimta chemotherapy. Continue until 21 days after Alimta completed., Disp: 100 tablet, Rfl: 3 .  HYDROcodone-acetaminophen (NORCO/VICODIN) 5-325 MG tablet, Take 1 tablet by mouth every 4 (four) hours as needed for moderate pain. 1 or 2 tabs PO q4 hours prn pain, Disp: 90 tablet, Rfl: 0 .  lisinopril (PRINIVIL,ZESTRIL) 5 MG tablet, Take 5 mg by mouth every evening. , Disp: , Rfl:  .  simvastatin (ZOCOR) 40 MG tablet, Take 40 mg by mouth every evening. , Disp: , Rfl:  No current facility-administered medications for this visit.   Facility-Administered Medications Ordered in Other Visits:  .  sodium chloride flush (NS) 0.9 % injection 10 mL, 10 mL, Intracatheter, PRN, Ceniyah Thorp, MD, 10 mL at 03/31/18 0953  Allergies Codeine  Review of Systems Review of Systems - Oncology ROS negative   Physical Exam  Vitals Wt Readings from Last 3 Encounters:  03/31/18 137 lb 9.6 oz (62.4 kg)  03/24/18 130 lb 6.4 oz (59.1 kg)  03/10/18 127 lb (57.6 kg)   Temp Readings from Last 3 Encounters:  03/31/18 98.1 F (36.7 C) (Oral)  03/24/18 98.1 F (36.7 C) (Oral)  03/13/18 98.7 F (37.1 C) (Oral)   BP Readings from Last 3 Encounters:  03/31/18 (!) 162/63  03/24/18 (!) 156/58  03/13/18 (!) 149/66   Pulse Readings from Last 3 Encounters:  03/24/18 80  03/13/18 64  03/10/18 72   Constitutional: Well-developed, well-nourished, and in no distress.   HENT: Head:  Normocephalic and atraumatic.  Mouth/Throat: No oropharyngeal exudate. Mucosa moist. Eyes: Pupils are equal, round, and reactive to light. Conjunctivae are normal. No scleral icterus.  Neck: Normal range of motion. Neck supple. No JVD present.  Cardiovascular:  Normal rate, regular rhythm and normal heart sounds.  Exam reveals no gallop and no friction rub.   No murmur heard. Pulmonary/Chest: Effort normal and breath sounds normal. No respiratory distress. No wheezes.No rales.  Abdominal: Soft. Bowel sounds are normal. No distension. There is no tenderness. There is no guarding.  Musculoskeletal: No edema or tenderness.  Lymphadenopathy: No cervical, axillary or supraclavicular adenopathy.  Neurological: Alert and oriented to person, place, and time. No cranial nerve deficit.  Skin: Skin is warm and dry. No rash noted. No erythema. No pallor.  Psychiatric: Affect and judgment normal.   Labs Appointment on 03/31/2018  Component Date Value Ref Range Status  . WBC 03/31/2018 6.1  4.0 - 10.5 K/uL Final  . RBC 03/31/2018 4.03  3.87 - 5.11 MIL/uL Final  . Hemoglobin 03/31/2018 9.4* 12.0 - 15.0 g/dL Final  . HCT 03/31/2018 33.6* 36.0 - 46.0 % Final  . MCV 03/31/2018 83.4  80.0 - 100.0 fL Final  . MCH 03/31/2018 23.3* 26.0 - 34.0 pg Final  . MCHC 03/31/2018 28.0* 30.0 - 36.0 g/dL Final  . RDW 03/31/2018 16.0* 11.5 - 15.5 % Final  . Platelets 03/31/2018 307  150 - 400 K/uL Final  . nRBC 03/31/2018 0.0  0.0 - 0.2 % Final  . Neutrophils Relative % 03/31/2018 70  % Final  . Neutro Abs 03/31/2018 4.3  1.7 - 7.7 K/uL Final  . Lymphocytes Relative 03/31/2018 10  % Final  . Lymphs Abs 03/31/2018 0.6* 0.7 - 4.0 K/uL Final  . Monocytes Relative 03/31/2018 15  % Final  . Monocytes Absolute 03/31/2018 0.9  0.1 - 1.0 K/uL Final  . Eosinophils Relative 03/31/2018 3  % Final  . Eosinophils Absolute 03/31/2018 0.2  0.0 - 0.5 K/uL Final  . Basophils Relative 03/31/2018 1  % Final  . Basophils Absolute  03/31/2018 0.0  0.0 - 0.1 K/uL Final  . Immature Granulocytes 03/31/2018 1  % Final  . Abs Immature Granulocytes 03/31/2018 0.03  0.00 - 0.07 K/uL Final   Performed at [facility], [address], [place], [place] [address]  . Sodium 03/31/2018 136  135 - 145 mmol/L Final  . Potassium 03/31/2018 3.6  3.5 - 5.1 mmol/L Final  . Chloride 03/31/2018 104  98 - 111 mmol/L Final  . CO2 03/31/2018 25  22 - 32 mmol/L Final  . Glucose, Bld 03/31/2018 101* 70 - 99 mg/dL Final  . BUN 03/31/2018 11  8 - 23 mg/dL Final  . Creatinine, Ser 03/31/2018 0.42* 0.44 - 1.00 mg/dL Final  . Calcium 03/31/2018 8.7* 8.9 - 10.3 mg/dL Final  . Total Protein 03/31/2018 6.7  6.5 - 8.1 g/dL Final  . Albumin 03/31/2018 3.0* 3.5 - 5.0 g/dL Final  . AST 03/31/2018 18  15 - 41 U/L Final  . ALT 03/31/2018 10  0 - 44 U/L Final  . Alkaline Phosphatase 03/31/2018 51  38 - 126 U/L Final  . Total Bilirubin 03/31/2018 0.5  0.3 - 1.2 mg/dL Final  . GFR calc non Af Amer 03/31/2018 >60  >60 mL/min Final  . GFR calc Af Amer 03/31/2018 >60  >60 mL/min Final  . Anion gap 03/31/2018 7  5 - 15 Final   Performed at [facility], [address], [place], [place] [address]     Pathology Orders Placed This Encounter  Procedures  . CBC with Differential    Standing Status:   Future    Standing Expiration Date:   04/01/2019  . Comprehensive metabolic  panel    Standing Status:   Future    Standing Expiration Date:   04/01/2019  . Lactate dehydrogenase    Standing Status:   Future    Standing Expiration Date:   04/01/2019  . T4 AND TSH    Standing Status:   Future    Standing Expiration Date:   04/01/2019       Zoila Shutter MD

## 2018-03-31 NOTE — Progress Notes (Signed)
Patient seen by Dr. Walden Field with lab review and ok to treat today and give Zometa verbal order Dr. Walden Field.   Patient tolerated chemotherapy with no complaints voiced.  Port site clean and dry with no bruising or swelling noted at site.  Good blood return noted before and after administration of chemotherapy.  Band aid applied.  Patient left ambulatory with VSS and no s/s of distress noted.   1515- Unable to reach the patient by phone to instruct to take calcium with vitamin d daily.

## 2018-03-31 NOTE — Treatment Plan (Signed)
Pt will continue on Carbo Pem Pem for one or two more cycles.  Insurance has denied the Atezo/Bev that Dr Walden Field preferred.  She will attempt to talk with insurance again for another second line choice.

## 2018-03-31 NOTE — Progress Notes (Signed)
Nutrition Assessment  Reason for Assessment: MD requests 2/2 reports of poor appetite  ASSESSMENT:  78 y/o female PMHx HTN/HLD, Tobacco abuse and NSCLC. Pt was dx in mid Oct. after being seen in ED for  intermittent cough x2 months. At that time, Chest CT revealed large suprahilar mass w/ scattered nodules throughout both lungs. Biopsy 11/13 + for adeno. 11/25: Chemo begun. 1/24: New small brain nodule suspicious for metastatic disease s/p 1 fraction tx. 2/6: New bone mets and probable muscle metastases seen. 2/18: RD asked to see pt d/t anorexia.    Pt seen with her sister during infusion. Pt initially says she has been eating fine.   Took 24 hr diet recall Breakfast: Skips (this is a long standing habit, not new) Lunch: 2 hotdogs and a garden salad Dinner: Best boy, Green Beans, Corn on cob Beverages: Coffee, water, sweet tea Supplements: 1 ensure/day  Pt says she has tolerated chemotherapy reasonable well since her start. She denies any n/v/c/d. She only reports general malaise. She does not report any frank loss of appetite.   Weight wise, pt says she was 160 lbs last year. There are a couple weight measurements in chart documenting pt as 160 lbs, but these look to have been reported by pt, not measured. Her first measured wt in chart was taken in Mid Oct at time of her Dx. She was 140.1 lbs. Today, she is 137.6 lbs, however it is important to note that today's weight is a large outlier, as she has been 127-131 for the previous 8 weeks. Wt last week was 130.4 lbs.   Wt Readings from Last 10 Encounters:  03/31/18 137 lb 9.6 oz (62.4 kg)  03/24/18 130 lb 6.4 oz (59.1 kg)  03/10/18 127 lb (57.6 kg)  03/03/18 133 lb 9 oz (60.6 kg)  02/10/18 129 lb 7 oz (58.7 kg)  01/20/18 131 lb 12.8 oz (59.8 kg)  12/29/17 135 lb 6.4 oz (61.4 kg)  12/24/17 141 lb (64 kg)  12/23/17 135 lb 3.2 oz (61.3 kg)  12/17/17 140 lb 3.2 oz (63.6 kg)   MEDICATIONS:  Chemotherapy: Pemetrexed,  Avastin,  Keytruda, Carboplatin Supportive Medications: Hydrocodone,  Other: Folate, Decadron (around chemo)  LABS:  Albumin: 3.0 (dropped greatly x3 months, was 3.7 in jan)  Recent Labs  Lab 03/31/18 0851  NA 136  K 3.6  CL 104  CO2 25  BUN 11  CREATININE 0.42*  CALCIUM 8.7*  GLUCOSE 101*   ANTHROPOMETRICS: Height:  Ht Readings from Last 1 Encounters:  03/10/18 5' 9.5" (1.765 m)   Weight:  Wt Readings from Last 1 Encounters:  03/31/18 137 lb 9.6 oz (62.4 kg)   BMI:  BMI Readings from Last 1 Encounters:  03/31/18 20.03 kg/m   Wt Readings from Last 10 Encounters:  03/31/18 137 lb 9.6 oz (62.4 kg)  03/24/18 130 lb 6.4 oz (59.1 kg)  03/10/18 127 lb (57.6 kg)  03/03/18 133 lb 9 oz (60.6 kg)  02/10/18 129 lb 7 oz (58.7 kg)  01/20/18 131 lb 12.8 oz (59.8 kg)  12/29/17 135 lb 6.4 oz (61.4 kg)  12/24/17 141 lb (64 kg)  12/23/17 135 lb 3.2 oz (61.3 kg)  12/17/17 140 lb 3.2 oz (63.6 kg)   UBW: Pt reports her ubw is 160 IBW: 147.5 lbs (67 kg)  ESTIMATED ENERGY NEEDS:  Kcal: 1850- 2050 kcals (30-33 kcal/kg bw) Protein: 81-94 kcal/kg bw) Fluid: 1.9-2.1 L fluid  NUTRITION - FOCUSED PHYSICAL EXAM: Deferred  NUTRITION DIAGNOSIS:  Increased  protein/kcal needs related to cancer and cancer related treatments as evidenced by the estimated nutritional requirements for this condition  DOCUMENTATION CODES:  N/A  INTERVENTION:  Though pt does not report any large decrease in appetite, her current eating habits are not ideal. She skips breakfast all together and her sister says pt may not eat her first meal until 2-3 pm.   RD talked a little about why both adequate protein intake and weight maintenance are essential during cancer treatment. Explained how her body now requires more kcals and protein than it did prior and she cannot expect to eat the same amount as she did before treatment and expect to do well during treatment RD told her that it is essential she break her habit of meal  skipping. She currently is fasting 15-20 hrs a day. Explained her body will start cannibalizing its muscle for energy if she continues to go this long in between meals.   RD reviewed high kcal/protein options. We discussed a large number of the pts preferred foods and whether or not they would be good choices for her. As one example, pt reportedly really enjoys nuts. RD told her this is a great food choice and recommended eating large amount of nut spreads such as Nutella and PB.   RD also stressed concept of never eating a food by itself. She should add toppings, condiments, dressings, creams, syrups etc to her meals. Examples given are adding whipped cream, chocolate syrup,fruit to pancakes and adding cheese, crackers to soup and adding. These items are calorie dense and take up little space in the stomach.   She could also improve her beverage choices. RD recommended she start drinking milk and continue choosing the oral supplements. RD explained the Ensure assistance program. She does not really like vanilla, but was open to try adding choc/straw syrup to them. RD provided pt her first case of Ensure today as well as coupons. RD also provided handout titled "Increasing Calories and Protein".   Will f/u with pt x3 weeks.   GOAL:  Oral intake to meet >90% of needs, wt stability  MONITOR:  Oral intake, whether or not she starts eating breakfast, ensure tolerance, chemo tolerance, labs, weight   Next Visit: 3/17  Burtis Junes RD, LDN, CNSC Clinical Nutrition Available Tues-Sat via Pager: [phone] 03/31/2018 11:00 AM

## 2018-03-31 NOTE — Patient Instructions (Signed)
[facility] Discharge Instructions for Patients Receiving Chemotherapy  Today you received the following chemotherapy agents  If you develop nausea and vomiting that is not controlled by your nausea medication, call the clinic.   BELOW ARE SYMPTOMS THAT SHOULD BE REPORTED IMMEDIATELY:  *FEVER GREATER THAN 100.5 F  *CHILLS WITH OR WITHOUT FEVER  NAUSEA AND VOMITING THAT IS NOT CONTROLLED WITH YOUR NAUSEA MEDICATION  *UNUSUAL SHORTNESS OF BREATH  *UNUSUAL BRUISING OR BLEEDING  TENDERNESS IN MOUTH AND THROAT WITH OR WITHOUT PRESENCE OF ULCERS  *URINARY PROBLEMS  *BOWEL PROBLEMS  UNUSUAL RASH Items with * indicate a potential emergency and should be followed up as soon as possible.  Feel free to call the clinic should you have any questions or concerns. The clinic phone number is [phone].  Please show the CHEMO ALERT CARD at check-in to the Emergency Department and triage nurse.   

## 2018-03-31 NOTE — Patient Instructions (Signed)
[facility]  Discharge Instructions: You saw Dr. Higgs today                               _______________________________________________________________  Thank you for choosing [facility] at [facility] to provide your oncology and hematology care.  To afford each patient quality time with our providers, please arrive at least 15 minutes before your scheduled appointment.  You need to re-schedule your appointment if you arrive 10 or more minutes late.  We strive to give you quality time with our providers, and arriving late affects you and other patients whose appointments are after yours.  Also, if you no show three or more times for appointments you may be dismissed from the clinic.  Again, thank you for choosing [facility] at [facility]. Our hope is that these requests will allow you access to exceptional care and in a timely manner. _______________________________________________________________  If you have questions after your visit, please contact our office at [phone] between the hours of 8:30 a.m. and 5:00 p.m. Voicemails left after 4:30 p.m. will not be returned until the following business day. _______________________________________________________________  For prescription refill requests, have your pharmacy contact our office. _______________________________________________________________  Recommendations made by the consultant and any test results will be sent to your referring physician. _______________________________________________________________ 

## 2018-04-13 DIAGNOSIS — K219 Gastro-esophageal reflux disease without esophagitis: Secondary | ICD-10-CM | POA: Diagnosis not present

## 2018-04-13 DIAGNOSIS — E782 Mixed hyperlipidemia: Secondary | ICD-10-CM | POA: Diagnosis not present

## 2018-04-13 DIAGNOSIS — E441 Mild protein-calorie malnutrition: Secondary | ICD-10-CM | POA: Diagnosis not present

## 2018-04-13 DIAGNOSIS — I10 Essential (primary) hypertension: Secondary | ICD-10-CM | POA: Diagnosis not present

## 2018-04-13 DIAGNOSIS — C7931 Secondary malignant neoplasm of brain: Secondary | ICD-10-CM | POA: Diagnosis not present

## 2018-04-13 DIAGNOSIS — E119 Type 2 diabetes mellitus without complications: Secondary | ICD-10-CM | POA: Diagnosis not present

## 2018-04-13 DIAGNOSIS — F419 Anxiety disorder, unspecified: Secondary | ICD-10-CM | POA: Diagnosis not present

## 2018-04-13 DIAGNOSIS — Z681 Body mass index (BMI) 19 or less, adult: Secondary | ICD-10-CM | POA: Diagnosis not present

## 2018-04-13 DIAGNOSIS — R6889 Other general symptoms and signs: Secondary | ICD-10-CM | POA: Diagnosis not present

## 2018-04-14 ENCOUNTER — Ambulatory Visit (HOSPITAL_COMMUNITY): Payer: Medicare HMO | Admitting: Internal Medicine

## 2018-04-14 ENCOUNTER — Other Ambulatory Visit (HOSPITAL_COMMUNITY): Payer: Medicare HMO

## 2018-04-14 ENCOUNTER — Ambulatory Visit (HOSPITAL_COMMUNITY): Payer: Medicare HMO

## 2018-04-17 DIAGNOSIS — F419 Anxiety disorder, unspecified: Secondary | ICD-10-CM | POA: Diagnosis not present

## 2018-04-17 DIAGNOSIS — C7931 Secondary malignant neoplasm of brain: Secondary | ICD-10-CM | POA: Diagnosis not present

## 2018-04-17 DIAGNOSIS — R2681 Unsteadiness on feet: Secondary | ICD-10-CM | POA: Diagnosis not present

## 2018-04-17 DIAGNOSIS — I10 Essential (primary) hypertension: Secondary | ICD-10-CM | POA: Diagnosis not present

## 2018-04-17 DIAGNOSIS — C7951 Secondary malignant neoplasm of bone: Secondary | ICD-10-CM | POA: Diagnosis not present

## 2018-04-17 DIAGNOSIS — E1149 Type 2 diabetes mellitus with other diabetic neurological complication: Secondary | ICD-10-CM | POA: Diagnosis not present

## 2018-04-17 DIAGNOSIS — F17210 Nicotine dependence, cigarettes, uncomplicated: Secondary | ICD-10-CM | POA: Diagnosis not present

## 2018-04-17 DIAGNOSIS — E441 Mild protein-calorie malnutrition: Secondary | ICD-10-CM | POA: Diagnosis not present

## 2018-04-17 DIAGNOSIS — C3491 Malignant neoplasm of unspecified part of right bronchus or lung: Secondary | ICD-10-CM | POA: Diagnosis not present

## 2018-04-21 ENCOUNTER — Inpatient Hospital Stay (HOSPITAL_COMMUNITY): Payer: Medicare HMO | Attending: Hematology

## 2018-04-21 ENCOUNTER — Inpatient Hospital Stay (HOSPITAL_COMMUNITY): Payer: Medicare HMO

## 2018-04-21 ENCOUNTER — Inpatient Hospital Stay (HOSPITAL_BASED_OUTPATIENT_CLINIC_OR_DEPARTMENT_OTHER): Payer: Medicare HMO | Admitting: Hematology

## 2018-04-21 ENCOUNTER — Other Ambulatory Visit (HOSPITAL_COMMUNITY): Payer: Medicare HMO

## 2018-04-21 ENCOUNTER — Other Ambulatory Visit: Payer: Self-pay

## 2018-04-21 ENCOUNTER — Inpatient Hospital Stay (HOSPITAL_COMMUNITY): Payer: Medicare HMO | Admitting: Dietician

## 2018-04-21 ENCOUNTER — Encounter (HOSPITAL_COMMUNITY): Payer: Self-pay | Admitting: Hematology

## 2018-04-21 VITALS — BP 129/76 | HR 66 | Temp 97.7°F | Resp 17

## 2018-04-21 VITALS — BP 137/61 | HR 77 | Temp 98.3°F | Resp 12 | Wt 118.8 lb

## 2018-04-21 DIAGNOSIS — C3492 Malignant neoplasm of unspecified part of left bronchus or lung: Secondary | ICD-10-CM

## 2018-04-21 DIAGNOSIS — Z79899 Other long term (current) drug therapy: Secondary | ICD-10-CM

## 2018-04-21 DIAGNOSIS — R0602 Shortness of breath: Secondary | ICD-10-CM

## 2018-04-21 DIAGNOSIS — F329 Major depressive disorder, single episode, unspecified: Secondary | ICD-10-CM | POA: Diagnosis not present

## 2018-04-21 DIAGNOSIS — I10 Essential (primary) hypertension: Secondary | ICD-10-CM

## 2018-04-21 DIAGNOSIS — C7951 Secondary malignant neoplasm of bone: Secondary | ICD-10-CM | POA: Diagnosis not present

## 2018-04-21 DIAGNOSIS — Z5112 Encounter for antineoplastic immunotherapy: Secondary | ICD-10-CM

## 2018-04-21 DIAGNOSIS — R634 Abnormal weight loss: Secondary | ICD-10-CM | POA: Insufficient documentation

## 2018-04-21 DIAGNOSIS — Z87891 Personal history of nicotine dependence: Secondary | ICD-10-CM

## 2018-04-21 DIAGNOSIS — R5383 Other fatigue: Secondary | ICD-10-CM | POA: Insufficient documentation

## 2018-04-21 DIAGNOSIS — Z923 Personal history of irradiation: Secondary | ICD-10-CM

## 2018-04-21 DIAGNOSIS — C7931 Secondary malignant neoplasm of brain: Secondary | ICD-10-CM

## 2018-04-21 DIAGNOSIS — E785 Hyperlipidemia, unspecified: Secondary | ICD-10-CM

## 2018-04-21 DIAGNOSIS — Z5111 Encounter for antineoplastic chemotherapy: Secondary | ICD-10-CM

## 2018-04-21 DIAGNOSIS — C3480 Malignant neoplasm of overlapping sites of unspecified bronchus and lung: Secondary | ICD-10-CM

## 2018-04-21 LAB — LACTATE DEHYDROGENASE: LDH: 271 U/L — AB (ref 98–192)

## 2018-04-21 LAB — CBC WITH DIFFERENTIAL/PLATELET
Abs Immature Granulocytes: 0.04 10*3/uL (ref 0.00–0.07)
Basophils Absolute: 0.1 10*3/uL (ref 0.0–0.1)
Basophils Relative: 1 %
Eosinophils Absolute: 0 10*3/uL (ref 0.0–0.5)
Eosinophils Relative: 0 %
HCT: 33.4 % — ABNORMAL LOW (ref 36.0–46.0)
Hemoglobin: 9.8 g/dL — ABNORMAL LOW (ref 12.0–15.0)
IMMATURE GRANULOCYTES: 1 %
Lymphocytes Relative: 14 %
Lymphs Abs: 0.8 10*3/uL (ref 0.7–4.0)
MCH: 24.1 pg — ABNORMAL LOW (ref 26.0–34.0)
MCHC: 29.3 g/dL — ABNORMAL LOW (ref 30.0–36.0)
MCV: 82.1 fL (ref 80.0–100.0)
Monocytes Absolute: 1.2 10*3/uL — ABNORMAL HIGH (ref 0.1–1.0)
Monocytes Relative: 22 %
NEUTROS PCT: 62 %
Neutro Abs: 3.4 10*3/uL (ref 1.7–7.7)
Platelets: 552 10*3/uL — ABNORMAL HIGH (ref 150–400)
RBC: 4.07 MIL/uL (ref 3.87–5.11)
RDW: 16.3 % — ABNORMAL HIGH (ref 11.5–15.5)
WBC: 5.5 10*3/uL (ref 4.0–10.5)
nRBC: 0 % (ref 0.0–0.2)

## 2018-04-21 LAB — COMPREHENSIVE METABOLIC PANEL
ALT: 9 U/L (ref 0–44)
AST: 23 U/L (ref 15–41)
Albumin: 3 g/dL — ABNORMAL LOW (ref 3.5–5.0)
Alkaline Phosphatase: 54 U/L (ref 38–126)
Anion gap: 8 (ref 5–15)
BUN: 13 mg/dL (ref 8–23)
CO2: 27 mmol/L (ref 22–32)
Calcium: 8.6 mg/dL — ABNORMAL LOW (ref 8.9–10.3)
Chloride: 102 mmol/L (ref 98–111)
Creatinine, Ser: 0.58 mg/dL (ref 0.44–1.00)
GFR calc Af Amer: >60 mL/min (ref >60)
GFR calc non Af Amer: >60 mL/min (ref >60)
Glucose, Bld: 110 mg/dL — ABNORMAL HIGH (ref 70–99)
Potassium: 3 mmol/L — ABNORMAL LOW (ref 3.5–5.1)
Sodium: 137 mmol/L (ref 135–145)
Total Bilirubin: 0.7 mg/dL (ref 0.3–1.2)
Total Protein: 7.2 g/dL (ref 6.5–8.1)

## 2018-04-21 LAB — TSH: TSH: 1.559 u[IU]/mL (ref 0.350–4.500)

## 2018-04-21 MED ORDER — HEPARIN SOD (PORK) LOCK FLUSH 100 UNIT/ML IV SOLN
500.0000 [IU] | Freq: Once | INTRAVENOUS | Status: AC | PRN
  Administered 2018-04-21: 500 [IU]

## 2018-04-21 MED ORDER — SODIUM CHLORIDE 0.9 % IV SOLN
200.0000 mg | Freq: Once | INTRAVENOUS | Status: AC
  Administered 2018-04-21: 200 mg via INTRAVENOUS
  Filled 2018-04-21: qty 8

## 2018-04-21 MED ORDER — SODIUM CHLORIDE 0.9 % IV SOLN
348.5000 mg | Freq: Once | INTRAVENOUS | Status: AC
  Administered 2018-04-21: 350 mg via INTRAVENOUS
  Filled 2018-04-21: qty 35

## 2018-04-21 MED ORDER — POTASSIUM CHLORIDE CRYS ER 20 MEQ PO TBCR
EXTENDED_RELEASE_TABLET | ORAL | Status: AC
  Filled 2018-04-21: qty 2

## 2018-04-21 MED ORDER — SODIUM CHLORIDE 0.9 % IV SOLN
Freq: Once | INTRAVENOUS | Status: AC
  Administered 2018-04-21: 10:00:00 via INTRAVENOUS

## 2018-04-21 MED ORDER — POTASSIUM CHLORIDE CRYS ER 20 MEQ PO TBCR
40.0000 meq | EXTENDED_RELEASE_TABLET | Freq: Once | ORAL | Status: AC
  Administered 2018-04-21: 40 meq via ORAL

## 2018-04-21 MED ORDER — CYANOCOBALAMIN 1000 MCG/ML IJ SOLN
INTRAMUSCULAR | Status: AC
  Filled 2018-04-21: qty 1

## 2018-04-21 MED ORDER — CYANOCOBALAMIN 1000 MCG/ML IJ SOLN: 1000.0000 ug | Freq: Once | INTRAMUSCULAR | Status: DC

## 2018-04-21 MED ORDER — CYANOCOBALAMIN 1000 MCG/ML IJ SOLN
1000.0000 ug | Freq: Once | INTRAMUSCULAR | Status: AC
  Administered 2018-04-21: 1000 ug via INTRAMUSCULAR

## 2018-04-21 MED ORDER — PALONOSETRON HCL INJECTION 0.25 MG/5ML
0.2500 mg | Freq: Once | INTRAVENOUS | Status: AC
  Administered 2018-04-21: 0.25 mg via INTRAVENOUS

## 2018-04-21 MED ORDER — SODIUM CHLORIDE 0.9 % IV SOLN
475.0000 mg/m2 | Freq: Once | INTRAVENOUS | Status: AC
  Administered 2018-04-21: 800 mg via INTRAVENOUS
  Filled 2018-04-21: qty 12

## 2018-04-21 MED ORDER — SODIUM CHLORIDE 0.9 % IV SOLN
Freq: Once | INTRAVENOUS | Status: AC
  Administered 2018-04-21: 10:00:00 via INTRAVENOUS
  Filled 2018-04-21: qty 5

## 2018-04-21 MED ORDER — ZOLEDRONIC ACID 4 MG/100ML IV SOLN
4.0000 mg | Freq: Once | INTRAVENOUS | Status: AC
  Administered 2018-04-21: 4 mg via INTRAVENOUS
  Filled 2018-04-21: qty 100

## 2018-04-21 MED ORDER — PALONOSETRON HCL INJECTION 0.25 MG/5ML
INTRAVENOUS | Status: AC
  Filled 2018-04-21: qty 5

## 2018-04-21 MED ORDER — MIRTAZAPINE 15 MG PO TABS: 15.0000 mg | ORAL_TABLET | Freq: Every day | ORAL | 1 refills | Status: DC

## 2018-04-21 MED ORDER — SODIUM CHLORIDE 0.9% FLUSH
10.0000 mL | INTRAVENOUS | Status: DC | PRN
  Administered 2018-04-21 (×2): 10 mL
  Filled 2018-04-21 (×2): qty 10

## 2018-04-21 NOTE — Progress Notes (Signed)
Nutrition Assessment  ASSESSMENT:  78 y/o female PMHx HTN/HLD, Tobacco abuse and NSCLC. Pt was dx in mid Oct. after being seen in ED for  intermittent cough x2 months. At that time, Chest CT revealed large suprahilar mass w/ scattered nodules throughout both lungs. Biopsy 11/13 + for adeno. 11/25: Chemo begun. 1/24: New small brain nodule suspicious for metastatic disease s/p 1 fraction tx. 2/6: New bone mets and probable muscle metastases seen. 2/18: RD asked to see pt d/t anorexia.    Pt has lost a troubling amount of weight since she was last seen. She is 118.8 lbs today, which is a loss of 19 lbs in just 3 weeks (13.8% bw). That said, her last weight of 137.8 lbs was an outlier. Excluding that weight, she had been 127-131 x 3 months and 127-137 lbs since November.  Despite her degree of weight loss, pt reports being relatively unchanged. She denies any new or worsening n/v/c/d. She does endorse dysgeusia, though she says this is only "sometimes'.   Diet recall:  Breakfast: Skips (40% time) vs Fast food biscuit/ham biscuit Lunch: Soup (vegetable) or sandwich, sometimes skips Dinner: Soup (vegetable) or an item a friend/family member brought to her.  Beverages: Water, sweet tea, soda, supplements Supplements: 2-3 Ensures/day. Has been adding choc syrup to them Snacks: Chips or fruit   When asked how often she eats on average, she says she typically only has 1 "good meal" a day. The rest of the time she eats lighter items (soups), snacks and supplements.   She says "I know I am not eating like I should". She sounds to be trying though. She shares she watches the [place] on TV to try to help stimulate her appetite. She has had on and off success with this.    Wt Readings from Last 10 Encounters:  04/21/18 118 lb 12.8 oz (53.9 kg)  03/31/18 137 lb 9.6 oz (62.4 kg)  03/24/18 130 lb 6.4 oz (59.1 kg)  03/10/18 127 lb (57.6 kg)  03/03/18 133 lb 9 oz (60.6 kg)  02/10/18 129 lb 7 oz (58.7  kg)  01/20/18 131 lb 12.8 oz (59.8 kg)  12/29/17 135 lb 6.4 oz (61.4 kg)  12/24/17 141 lb (64 kg)  12/23/17 135 lb 3.2 oz (61.3 kg)   MEDICATIONS:  Chemotherapy: Pemetrexed,  Avastin, Keytruda, Carboplatin Supportive Medications: Hydrocodone,  Other: Folate, Decadron (around chemo)  LABS:  Albumin: 3.0 (stable x3 weeks), BUN/Creat: 13/.58 (stable), ldh 220-> 271  Recent Labs  Lab 04/21/18 0841  NA 137  K 3.0*  CL 102  CO2 27  BUN 13  CREATININE 0.58  CALCIUM 8.6*  GLUCOSE 110*   ANTHROPOMETRICS: Height:  Ht Readings from Last 1 Encounters:  03/10/18 5' 9.5" (1.765 m)   Weight:  Wt Readings from Last 1 Encounters:  04/21/18 118 lb 12.8 oz (53.9 kg)   BMI:  BMI Readings from Last 1 Encounters:  04/21/18 17.29 kg/m   Wt Readings from Last 10 Encounters:  04/21/18 118 lb 12.8 oz (53.9 kg)  03/31/18 137 lb 9.6 oz (62.4 kg)  03/24/18 130 lb 6.4 oz (59.1 kg)  03/10/18 127 lb (57.6 kg)  03/03/18 133 lb 9 oz (60.6 kg)  02/10/18 129 lb 7 oz (58.7 kg)  01/20/18 131 lb 12.8 oz (59.8 kg)  12/29/17 135 lb 6.4 oz (61.4 kg)  12/24/17 141 lb (64 kg)  12/23/17 135 lb 3.2 oz (61.3 kg)   UBW: Pt reports her ubw is 160 IBW: 147.5  lbs (67 kg)  ESTIMATED ENERGY NEEDS:  Kcal: 1900-2150 kcals (35-40 kcal/kg bw) Protein: 81-97g Pro (1.5-1.8g/kg bw) Fluid: 1.9-2.2 L fluid  NUTRITION - FOCUSED PHYSICAL EXAM: Deferred  NUTRITION DIAGNOSIS:  Increased protein/kcal needs related to cancer and cancer related treatments as evidenced by the estimated nutritional requirements for this condition  DOCUMENTATION CODES:  N/A - would likely meet criteria with NFPe  INTERVENTION:  Though pt does not report any large changes in her intake or how she has been feeling, she has had an an alarming amount of weight loss.   Discussed with MD, who felt this is likely related to progression of disease. For this reason, he ordered repeat imaging today to evaluate.   RD went through pts recall  and made recommendations to help optimize her diet. There are clear areas where she can improve and RD emphasized importance of making changes to them. The items she eats in between meals are carb-only items (chips/fruit). She needs to choose items high in fat and protein. RD listed several examples and wrote them on handout titled "Making the Most of Each Bite". Her entrees are also lacking in kcals/protein. Instead of vegetable soup, she should be choosing creamier, high fat soups (crm chicken, crm potato, clam chowder etc).   Re-educated pt on concept of "Never eating a food by itself". She is not adding any additives to her meals. She could be adding cheese, crackers, or protein powder to her soups. She needs to make liberal use of gravies, dressings and condiments.  Reiterated, even though she does not have an appetite, she HAS to eat. RD instructed her to eat 4-6x a day. While these dont have to be meals per se, she needs to increase the frequency of which she eats. Given anorexia appears to be her largest barrier, RD recommended to MD starting an appetite stimulant. MD to order Remeron for the pt.   RD provided her her second case of Ensure.   RD emphasized she try as hard as she can to follow RDs recommendations, as she will likely not be able to tolerate further therapy if she continues to lose weight at her current rate.   GOAL:  Oral intake to meet >90% of needs, wt stability  Not met  MONITOR:  Oral intake, Effect of remeron, chemo tolerance, labs, weight, imaging results   Next Visit: 4/7  Burtis Junes RD, LDN, CNSC Clinical Nutrition Available Tues-Sat via Pager: [phone] 04/21/2018 11:14 AM

## 2018-04-21 NOTE — Progress Notes (Signed)
Labs from port.

## 2018-04-21 NOTE — Progress Notes (Signed)
Melinda Larsen, [place] [id number]   CLINIC:  Medical Oncology/Hematology  PCP:  Redmond School, MD [address] [place] [address] [phone]   REASON FOR VISIT:  Follow-up for Adenocarcinoma of left lung, stage 4   BRIEF ONCOLOGIC HISTORY:    Lung cancer ([place])   12/23/2017 Initial Diagnosis    Lung cancer ([place])    12/29/2017 - 03/23/2018 Chemotherapy    The patient had palonosetron (ALOXI) injection 0.25 mg, 0.25 mg, Intravenous,  Once, 4 of 6 cycles Administration: 0.25 mg (12/29/2017), 0.25 mg (02/10/2018), 0.25 mg (03/03/2018), 0.25 mg (01/20/2018) PEMEtrexed (ALIMTA) 900 mg in sodium chloride 0.9 % 100 mL chemo infusion, 515 mg/m2 = 875 mg, Intravenous,  Once, 4 of 6 cycles Administration: 900 mg (12/29/2017), 900 mg (02/10/2018), 900 mg (03/03/2018), 900 mg (01/20/2018) CARBOplatin (PARAPLATIN) 360 mg in sodium chloride 0.9 % 250 mL chemo infusion, 360 mg (100 % of original dose 356.5 mg), Intravenous,  Once, 4 of 6 cycles Dose modification:   (original dose 356.5 mg, Cycle 1),   (original dose 356.5 mg, Cycle 3),   (original dose 356.5 mg, Cycle 4),   (original dose 356.5 mg, Cycle 2) Administration: 360 mg (12/29/2017), 360 mg (02/10/2018), 360 mg (03/03/2018), 360 mg (01/20/2018) pembrolizumab (KEYTRUDA) 200 mg in sodium chloride 0.9 % 50 mL chemo infusion, 200 mg, Intravenous, Once, 4 of 6 cycles Administration: 200 mg (12/29/2017), 200 mg (02/10/2018), 200 mg (03/03/2018), 200 mg (01/20/2018) fosaprepitant (EMEND) 150 mg, dexamethasone (DECADRON) 12 mg in sodium chloride 0.9 % 145 mL IVPB, , Intravenous,  Once, 4 of 6 cycles Administration:  (12/29/2017),  (02/10/2018),  (03/03/2018),  (01/20/2018)  for chemotherapy treatment.     03/31/2018 -  Chemotherapy    The patient had palonosetron (ALOXI) injection 0.25 mg, 0.25 mg, Intravenous,  Once, 2 of 2 cycles Administration: 0.25 mg (03/31/2018), 0.25 mg (04/21/2018) PEMEtrexed (ALIMTA) 900  mg in sodium chloride 0.9 % 100 mL chemo infusion, 850 mg, Intravenous,  Once, 2 of 2 cycles Administration: 900 mg (03/31/2018) CARBOplatin (PARAPLATIN) 350 mg in sodium chloride 0.9 % 250 mL chemo infusion, 350 mg (100 % of original dose 348.5 mg), Intravenous,  Once, 2 of 2 cycles Dose modification:   (original dose 348.5 mg, Cycle 1),   (original dose 348.5 mg, Cycle 2) Administration: 350 mg (03/31/2018) pembrolizumab (KEYTRUDA) 200 mg in sodium chloride 0.9 % 50 mL chemo infusion, 200 mg, Intravenous, Once, 2 of 2 cycles Administration: 200 mg (03/31/2018) fosaprepitant (EMEND) 150 mg, dexamethasone (DECADRON) 12 mg in sodium chloride 0.9 % 145 mL IVPB, , Intravenous,  Once, 2 of 2 cycles Administration:  (03/31/2018),  (04/21/2018)  for chemotherapy treatment.      Brain metastasis ([place])   03/10/2018 Initial Diagnosis    Brain metastasis ([place])    03/31/2018 -  Chemotherapy    The patient had palonosetron (ALOXI) injection 0.25 mg, 0.25 mg, Intravenous,  Once, 2 of 2 cycles Administration: 0.25 mg (03/31/2018), 0.25 mg (04/21/2018) PEMEtrexed (ALIMTA) 900 mg in sodium chloride 0.9 % 100 mL chemo infusion, 850 mg, Intravenous,  Once, 2 of 2 cycles Administration: 900 mg (03/31/2018) CARBOplatin (PARAPLATIN) 350 mg in sodium chloride 0.9 % 250 mL chemo infusion, 350 mg (100 % of original dose 348.5 mg), Intravenous,  Once, 2 of 2 cycles Dose modification:   (original dose 348.5 mg, Cycle 1),   (original dose 348.5 mg, Cycle 2) Administration: 350 mg (03/31/2018) pembrolizumab (KEYTRUDA) 200 mg in sodium chloride  0.9 % 50 mL chemo infusion, 200 mg, Intravenous, Once, 2 of 2 cycles Administration: 200 mg (03/31/2018) fosaprepitant (EMEND) 150 mg, dexamethasone (DECADRON) 12 mg in sodium chloride 0.9 % 145 mL IVPB, , Intravenous,  Once, 2 of 2 cycles Administration:  (03/31/2018),  (04/21/2018)  for chemotherapy treatment.       CANCER STAGING: Cancer Staging No matching staging information was  found for the patient.   INTERVAL HISTORY:  Melinda Larsen 78 y.o. female returns for routine follow-up and consideration for next cycle of chemotherapy. She is here today with family. She states that the radiation drained her. She states that the last chemo treatment was bad on her, she was more tired. She states that is drinking Boost at least 2 times a day. Denies any nausea, vomiting, or diarrhea. Denies any new pains. Had not noticed any recent bleeding such as epistaxis, hematuria or hematochezia. Denies recent chest pain on exertion, pre-syncopal episodes, or palpitations. Denies any numbness or tingling in hands or feet. Denies any recent fevers, infections, or recent hospitalizations. Patient reports appetite at 50% and energy level at 0%.     REVIEW OF SYSTEMS:  Review of Systems  Constitutional: Positive for fatigue.  Respiratory: Positive for shortness of breath.   Psychiatric/Behavioral: Positive for depression.     PAST MEDICAL/SURGICAL HISTORY:  Past Medical History:  Diagnosis Date  . Anemia   . Borderline diabetes   . Dyspnea   . Hyperlipidemia   . Hypertension   . Lung cancer ([place]) 12/23/2017  . Metastasis to bone ([place]) 03/24/2018  . Pneumonia   . Pre-diabetes    Past Surgical History:  Procedure Laterality Date  . ABDOMINAL HYSTERECTOMY    . APPENDECTOMY    . COLONOSCOPY N/A 07/16/2017   Procedure: COLONOSCOPY;  Surgeon: Danie Binder, MD;  Location: [facility];  Service: Endoscopy;  Laterality: N/A;  10:30  . IR IMAGING GUIDED PORT INSERTION  12/24/2017  . POLYPECTOMY  07/16/2017   Procedure: POLYPECTOMY;  Surgeon: Danie Binder, MD;  Location: [facility];  Service: Endoscopy;;  cecal x2;hepatic flexurex5; splenic x1;descending x2  . VIDEO BRONCHOSCOPY WITH ENDOBRONCHIAL ULTRASOUND N/A 12/17/2017   Procedure: VIDEO BRONCHOSCOPY WITH ENDOBRONCHIAL ULTRASOUND;  Surgeon: Melrose Nakayama, MD;  Location: [facility];  Service: Thoracic;  Laterality: N/A;      SOCIAL HISTORY:  Social History   Socioeconomic History  . Marital status: Married    Spouse name: Not on file  . Number of children: Not on file  . Years of education: Not on file  . Highest education level: Not on file  Occupational History  . Not on file  Social Needs  . Financial resource strain: Not on file  . Food insecurity:    Worry: Not on file    Inability: Not on file  . Transportation needs:    Medical: No    Non-medical: No  Tobacco Use  . Smoking status: Former Smoker    Packs/day: 1.00    Years: 40.00    Pack years: 40.00    Types: Cigarettes    Last attempt to quit: 12/09/2012    Years since quitting: 5.3  . Smokeless tobacco: Never Used  . Tobacco comment: off and on since her teens  Substance and Sexual Activity  . Alcohol use: No  . Drug use: No  . Sexual activity: Not on file  Lifestyle  . Physical activity:    Days per week: Not on file    Minutes  per session: Not on file  . Stress: Not on file  Relationships  . Social connections:    Talks on phone: Not on file    Gets together: Not on file    Attends religious service: Not on file    Active member of club or organization: Not on file    Attends meetings of clubs or organizations: Not on file    Relationship status: Not on file  . Intimate partner violence:    Fear of current or ex partner: Not on file    Emotionally abused: Not on file    Physically abused: Not on file    Forced sexual activity: Not on file  Other Topics Concern  . Not on file  Social History Narrative  . Not on file    FAMILY HISTORY:  Family History  Problem Relation Age of Onset  . Hypertension Mother   . Hypertension Father   . Colon cancer Neg Hx   . Colon polyps Neg Hx     CURRENT MEDICATIONS:  Outpatient Encounter Medications as of 04/21/2018  Medication Sig Note  . Accu-Chek FastClix Lancets MISC    . ACCU-CHEK GUIDE test strip    . albuterol (PROVENTIL HFA;VENTOLIN HFA) 108 (90 Base) MCG/ACT inhaler  Inhale 2 puffs into the lungs every 4 (four) hours as needed for wheezing or shortness of breath (or coughing).   Marland Kitchen amLODipine (NORVASC) 5 MG tablet Take 5 mg by mouth every evening.    Marland Kitchen aspirin EC 81 MG tablet Take 81 mg by mouth every evening.    . Blood Glucose Monitoring Suppl (ACCU-CHEK GUIDE) w/Device KIT    . dexamethasone (DECADRON) 4 MG tablet Take 1 tab two times a day the day before Alimta chemo, then take 2 tabs once a day for 3 days starting the day after chemo.   . folic acid (FOLVITE) 1 MG tablet Take 1 tablet (1 mg total) by mouth daily. Start 5-7 days before Alimta chemotherapy. Continue until 21 days after Alimta completed. 12/24/2017: Not started yet  . HYDROcodone-acetaminophen (NORCO/VICODIN) 5-325 MG tablet Take 1 tablet by mouth every 4 (four) hours as needed for moderate pain. 1 or 2 tabs PO q4 hours prn pain   . lisinopril (PRINIVIL,ZESTRIL) 5 MG tablet Take 5 mg by mouth every evening.    . mirtazapine (REMERON) 15 MG tablet Take 1 tablet (15 mg total) by mouth at bedtime.   . simvastatin (ZOCOR) 40 MG tablet Take 40 mg by mouth every evening.     Facility-Administered Encounter Medications as of 04/21/2018  Medication Note  . [DISCONTINUED] cyanocobalamin ((VITAMIN B-12)) injection 1,000 mcg 04/21/2018: entered in infusion encounter    ALLERGIES:  Allergies  Allergen Reactions  . Codeine Nausea Only and Other (See Comments)    Nervous/sweating     PHYSICAL EXAM:  ECOG Performance status: 1  Vitals:   04/21/18 0826  BP: 137/61  Pulse: 77  Resp: 12  Temp: 98.3 F (36.8 C)  SpO2: 100%   Filed Weights   04/21/18 0826  Weight: 118 lb 12.8 oz (53.9 kg)    Physical Exam   LABORATORY DATA:  I have reviewed the labs as listed.  CBC    Component Value Date/Time   WBC 5.5 04/21/2018 0841   RBC 4.07 04/21/2018 0841   HGB 9.8 (L) 04/21/2018 0841   HCT 33.4 (L) 04/21/2018 0841   PLT 552 (H) 04/21/2018 0841   MCV 82.1 04/21/2018 0841   MCH 24.1 (L)  04/21/2018 0841   MCHC 29.3 (L) 04/21/2018 0841   RDW 16.3 (H) 04/21/2018 0841   LYMPHSABS 0.8 04/21/2018 0841   MONOABS 1.2 (H) 04/21/2018 0841   EOSABS 0.0 04/21/2018 0841   BASOSABS 0.1 04/21/2018 0841   CMP Latest Ref Rng & Units 04/21/2018 03/31/2018 03/24/2018  Glucose 70 - 99 mg/dL 110(H) 101(H) 106(H)  BUN 8 - 23 mg/dL '13 11 13  '$ Creatinine 0.44 - 1.00 mg/dL 0.58 0.42(L) 0.53  Sodium 135 - 145 mmol/L 137 136 136  Potassium 3.5 - 5.1 mmol/L 3.0(L) 3.6 3.5  Chloride 98 - 111 mmol/L 102 104 105  CO2 22 - 32 mmol/L '27 25 24  '$ Calcium 8.9 - 10.3 mg/dL 8.6(L) 8.7(L) 8.8(L)  Total Protein 6.5 - 8.1 g/dL 7.2 6.7 7.0  Total Bilirubin 0.3 - 1.2 mg/dL 0.7 0.5 0.7  Alkaline Phos 38 - 126 U/L 54 51 45  AST 15 - 41 U/L '23 18 17  '$ ALT 0 - 44 U/L 9 10 <5       DIAGNOSTIC IMAGING:  I have independently reviewed the scans and discussed with the patient.   I have reviewed Venita Lick LPN's note and agree with the documentation.  I personally performed a face-to-face visit, made revisions and my assessment and plan is as follows.    ASSESSMENT & PLAN:   Lung cancer ([place]) 1.  Metastatic lung adenocarcinoma: -Left upper lobe lung biopsy on 12/17/2017 consistent with adenocarcinoma. - PDL 1-10%, not enough tissue for foundation 1 testing. - PET scan on 11/23/2017 shows hypermetabolic left hilar mass encroaching on AP window, nodularity in the anterior left upper lobe and superior left upper lobe, hypermetabolic metastatic pulmonary nodule within the right upper lobe. - 4 cycles of carboplatin, pemetrexed and pembrolizumab from 12/29/2017 through 03/03/2018 -PET/CT scan on 03/12/2018 shows dominant left suprahilar mass with no change in size or metabolic him, bilateral pulmonary nodules decreased in size, new bilateral small hypermetabolic axial lymph nodes, new metastatic bone lesions in the right iliac bone, left femur and posterior left 11th rib, probable muscle metastasis in the left gluteus  muscle and left diaphragm adjacent stomach. - Patient was continued on same chemotherapy with cycle 5 on 03/31/2018.  Dr. Walden Field reportedly talk to the insurance which would not approve change in therapy. - She did not have any chemotherapy related side effects.  She did not have any immunotherapy related side effects.  She felt more tired after last cycle. -We reviewed her blood work.  She may proceed with cycle 6 today.  I plan to repeat another PET scan after this cycle. -We will consider sending guardant 360 for mutation analysis.  2.  Weight loss: -She lost about 10 to 12 pounds since last visit.  She is apparently drinking boost twice a day. -Likely from progression of malignancy. -Appetite has decreased.  We will start her on mirtazapine 15 mg and titrated up as needed.  3.  Brain metastasis: - MRI on 02/27/2018 shows 5 mm enhancing nodule right inferior cerebellar vermis. - SRS on 03/13/2018 ([address]ables) -She has an appointment to see Dr. Lisbeth Renshaw.  Total time spent is 40 minutes with more than 50% of the time spent face-to-face discussing scan results, further plan, and coordination of care.    Orders placed this encounter:  Orders Placed This Encounter  Procedures  . NM PET Image Restag (PS) Skull Base To Thigh  . CBC with Differential/Platelet  . Comprehensive metabolic panel      Derek Jack, MD  [place] [phone]

## 2018-04-21 NOTE — Patient Instructions (Addendum)
[place] at [facility] Discharge Instructions  You were seen today by Dr. Delton Coombes. He went over your recent results. She needs to start taking calcium twice a day over the counter. He would like you to have a PET scan, we will schedule you for that. He wants you to increase your boost to 3 times a day. He will see you back in 3 weeks for labs and follow up.   Thank you for choosing [place] at [facility] to provide your oncology and hematology care.  To afford each patient quality time with our provider, please arrive at least 15 minutes before your scheduled appointment time.   If you have a lab appointment with the [facility] please come in thru the  Main Entrance and check in at the main information desk  You need to re-schedule your appointment should you arrive 10 or more minutes late.  We strive to give you quality time with our providers, and arriving late affects you and other patients whose appointments are after yours.  Also, if you no show three or more times for appointments you may be dismissed from the clinic at the providers discretion.     Again, thank you for choosing [facility].  Our hope is that these requests will decrease the amount of time that you wait before being seen by our physicians.       _____________________________________________________________  Should you have questions after your visit to [facility], please contact our office at [phone] between the hours of 8:00 a.m. and 4:30 p.m.  Voicemails left after 4:00 p.m. will not be returned until the following business day.  For prescription refill requests, have your pharmacy contact our office and allow 72 hours.    Cancer Center Support Programs:   > Cancer Support Group  2nd Tuesday of the month 1pm-2pm, Journey Room

## 2018-04-21 NOTE — Patient Instructions (Signed)
[facility] Discharge Instructions for Patients Receiving Chemotherapy  Today you received the following chemotherapy agents   To help prevent nausea and vomiting after your treatment, we encourage you to take your nausea medication   If you develop nausea and vomiting that is not controlled by your nausea medication, call the clinic.   BELOW ARE SYMPTOMS THAT SHOULD BE REPORTED IMMEDIATELY:  *FEVER GREATER THAN 100.5 F  *CHILLS WITH OR WITHOUT FEVER  NAUSEA AND VOMITING THAT IS NOT CONTROLLED WITH YOUR NAUSEA MEDICATION  *UNUSUAL SHORTNESS OF BREATH  *UNUSUAL BRUISING OR BLEEDING  TENDERNESS IN MOUTH AND THROAT WITH OR WITHOUT PRESENCE OF ULCERS  *URINARY PROBLEMS  *BOWEL PROBLEMS  UNUSUAL RASH Items with * indicate a potential emergency and should be followed up as soon as possible.  Feel free to call the clinic should you have any questions or concerns. The clinic phone number is [phone].  Please show the CHEMO ALERT CARD at check-in to the Emergency Department and triage nurse.   

## 2018-04-21 NOTE — Progress Notes (Signed)
Message received pt is ready for treatment. VSS. Labs reviewed with Dr. Delton Coombes. VO received to give pt 10meq PO of K+ now 1 dose. K+ 3.0.   Pt needs B12 ordered every 9 weeks with Alimta. Scheduling notified and ATravis LPN notified. Requested B12 to be placed under supportive therapy.   Treatment given today per MD orders. Tolerated infusion without adverse affects. Vital signs stable. No complaints at this time. Discharged from clinic ambulatory. F/U with [facility] as scheduled.

## 2018-04-21 NOTE — Progress Notes (Signed)
  Radiation Oncology         [phone]) [phone] ________________________________  Name: Melinda Larsen MRN: [id number]  Date: 03/13/2018  DOB: 25-Feb-1940  End of Treatment Note  Diagnosis:   78 y.o. female with stage IV lung cancer with brain metastasis   Indication for treatment:  palliative       Radiation treatment dates:   03/13/2018  Site/dose:   Brain PTV1: Right cerebellum 67mm // 20 Gy in 1 fraction, max dose=125.4%  Beams/energy:   ExacTrac SBRT/SRT-3D, 3 [place] fields // 6FFF Photon  Narrative: The patient tolerated radiation treatment well.   There were no signs of acute toxicity after treatment.  Plan: The patient has completed radiation treatment. The patient will return to radiation oncology clinic for routine followup in one month. I advised the patient to call or return sooner if they have any questions or concerns related to their recovery or treatment. ________________________________  Jodelle Gross, MD, PhD  This document serves as a record of services personally performed by Kyung Rudd, MD. It was created on his behalf by Rae Lips, a trained medical scribe. The creation of this record is based on the scribe's personal observations and the provider's statements to them. This document has been checked and approved by the attending provider.

## 2018-04-21 NOTE — Assessment & Plan Note (Addendum)
1.  Metastatic lung adenocarcinoma: -Left upper lobe lung biopsy on 12/17/2017 consistent with adenocarcinoma. - PDL 1-10%, not enough tissue for foundation 1 testing. - PET scan on 11/23/2017 shows hypermetabolic left hilar mass encroaching on AP window, nodularity in the anterior left upper lobe and superior left upper lobe, hypermetabolic metastatic pulmonary nodule within the right upper lobe. - 4 cycles of carboplatin, pemetrexed and pembrolizumab from 12/29/2017 through 03/03/2018 -PET/CT scan on 03/12/2018 shows dominant left suprahilar mass with no change in size or metabolic him, bilateral pulmonary nodules decreased in size, new bilateral small hypermetabolic axial lymph nodes, new metastatic bone lesions in the right iliac bone, left femur and posterior left 11th rib, probable muscle metastasis in the left gluteus muscle and left diaphragm adjacent stomach. - Patient was continued on same chemotherapy with cycle 5 on 03/31/2018.  Dr. Walden Field reportedly talk to the insurance which would not approve change in therapy. - She did not have any chemotherapy related side effects.  She did not have any immunotherapy related side effects.  She felt more tired after last cycle. -We reviewed her blood work.  She may proceed with cycle 6 today.  I plan to repeat another PET scan after this cycle. -We will consider sending guardant 360 for mutation analysis.  2.  Weight loss: -She lost about 10 to 12 pounds since last visit.  She is apparently drinking boost twice a day. -Likely from progression of malignancy. -Appetite has decreased.  We will start her on mirtazapine 15 mg and titrated up as needed.  3.  Brain metastasis: - MRI on 02/27/2018 shows 5 mm enhancing nodule right inferior cerebellar vermis. - SRS on 03/13/2018 (20 [address]ables) -She has an appointment to see Dr. Lisbeth Renshaw.

## 2018-04-22 ENCOUNTER — Ambulatory Visit: Payer: Self-pay | Admitting: Radiation Oncology

## 2018-04-22 ENCOUNTER — Telehealth: Payer: Self-pay | Admitting: Radiation Oncology

## 2018-04-22 LAB — T4: T4, Total: 7.1 ug/dL (ref 4.5–12.0)

## 2018-04-22 NOTE — Telephone Encounter (Signed)
LM needing to cancel pt's appt given recommendations due to coronavirus. I asked her to call me back so we can discus her case and plans for scans and follow up.

## 2018-04-23 DIAGNOSIS — C3491 Malignant neoplasm of unspecified part of right bronchus or lung: Secondary | ICD-10-CM | POA: Diagnosis not present

## 2018-04-23 DIAGNOSIS — F419 Anxiety disorder, unspecified: Secondary | ICD-10-CM | POA: Diagnosis not present

## 2018-04-23 DIAGNOSIS — I10 Essential (primary) hypertension: Secondary | ICD-10-CM | POA: Diagnosis not present

## 2018-04-23 DIAGNOSIS — E441 Mild protein-calorie malnutrition: Secondary | ICD-10-CM | POA: Diagnosis not present

## 2018-04-23 DIAGNOSIS — R2681 Unsteadiness on feet: Secondary | ICD-10-CM | POA: Diagnosis not present

## 2018-04-23 DIAGNOSIS — E1149 Type 2 diabetes mellitus with other diabetic neurological complication: Secondary | ICD-10-CM | POA: Diagnosis not present

## 2018-04-23 DIAGNOSIS — C7931 Secondary malignant neoplasm of brain: Secondary | ICD-10-CM | POA: Diagnosis not present

## 2018-04-23 DIAGNOSIS — F17210 Nicotine dependence, cigarettes, uncomplicated: Secondary | ICD-10-CM | POA: Diagnosis not present

## 2018-04-23 DIAGNOSIS — C7951 Secondary malignant neoplasm of bone: Secondary | ICD-10-CM | POA: Diagnosis not present

## 2018-04-24 DIAGNOSIS — E441 Mild protein-calorie malnutrition: Secondary | ICD-10-CM | POA: Diagnosis not present

## 2018-04-24 DIAGNOSIS — F419 Anxiety disorder, unspecified: Secondary | ICD-10-CM | POA: Diagnosis not present

## 2018-04-24 DIAGNOSIS — C7931 Secondary malignant neoplasm of brain: Secondary | ICD-10-CM | POA: Diagnosis not present

## 2018-04-24 DIAGNOSIS — I10 Essential (primary) hypertension: Secondary | ICD-10-CM | POA: Diagnosis not present

## 2018-04-24 DIAGNOSIS — F17210 Nicotine dependence, cigarettes, uncomplicated: Secondary | ICD-10-CM | POA: Diagnosis not present

## 2018-04-24 DIAGNOSIS — E1149 Type 2 diabetes mellitus with other diabetic neurological complication: Secondary | ICD-10-CM | POA: Diagnosis not present

## 2018-04-24 DIAGNOSIS — R2681 Unsteadiness on feet: Secondary | ICD-10-CM | POA: Diagnosis not present

## 2018-04-24 DIAGNOSIS — C3491 Malignant neoplasm of unspecified part of right bronchus or lung: Secondary | ICD-10-CM | POA: Diagnosis not present

## 2018-04-24 DIAGNOSIS — C7951 Secondary malignant neoplasm of bone: Secondary | ICD-10-CM | POA: Diagnosis not present

## 2018-04-24 NOTE — Addendum Note (Signed)
Encounter addended by: Consuella Lose, MD on: 04/24/2018 10:57 AM  Actions taken: Clinical Note Signed

## 2018-04-24 NOTE — Op Note (Signed)
Name: Melinda Larsen    MRN: [id number]   Date: 03/13/2018    DOB: August 18, 1940   STEREOTACTIC RADIOSURGERY OPERATIVE NOTE  PRE-OPERATIVE DIAGNOSIS:  Metastatic lung CA  POST-OPERATIVE DIAGNOSIS:  Same  PROCEDURE:  Stereotactic Radiosurgery  SURGEON:  Consuella Lose, MD  RADIATION ONCOLOGIST: Dr. Kyung Rudd, MD  TECHNIQUE:  The patient underwent a radiation treatment planning session in the radiation oncology simulation suite under the care of the radiation oncology physician and physicist.  I participated closely in the radiation treatment planning afterwards. The patient underwent planning CT which was fused to 3T high resolution MRI with 1 mm axial slices.  These images were fused on the planning system.  We contoured the gross target volumes and subsequently expanded this to yield the Planning Target Volume. I actively participated in the planning process.  I helped to define and review the target contours and also the contours of the optic pathway, eyes, brainstem and selected nearby organs at risk.  All the dose constraints for critical structures were reviewed and compared to AAPM Task Group 101.  The prescription dose conformity was reviewed.  I approved the plan electronically.    Accordingly, Melinda Larsen  was brought to the TrueBeam stereotactic radiation treatment linac and placed in the custom immobilization mask.  The patient was aligned according to the IR fiducial markers with BrainLab Exactrac, then orthogonal x-rays were used in ExacTrac with the 6DOF robotic table and the shifts were made to align the patient  Melinda Larsen received stereotactic radiosurgery to a prescription dose of 20Gy to the single lesion in the right cerebellar vermis uneventfully.    The detailed description of the procedure is recorded in the radiation oncology procedure note.  I was present for the duration of the procedure.  DISPOSITION:   Following delivery, the patient was transported to  nursing in stable condition and monitored for possible acute effects to be discharged to home in stable condition with follow-up in one month.  Consuella Lose, MD [facility]

## 2018-04-27 ENCOUNTER — Ambulatory Visit: Payer: Medicare HMO | Admitting: Radiation Oncology

## 2018-04-27 DIAGNOSIS — E1149 Type 2 diabetes mellitus with other diabetic neurological complication: Secondary | ICD-10-CM | POA: Diagnosis not present

## 2018-04-27 DIAGNOSIS — C7951 Secondary malignant neoplasm of bone: Secondary | ICD-10-CM | POA: Diagnosis not present

## 2018-04-27 DIAGNOSIS — E441 Mild protein-calorie malnutrition: Secondary | ICD-10-CM | POA: Diagnosis not present

## 2018-04-27 DIAGNOSIS — I10 Essential (primary) hypertension: Secondary | ICD-10-CM | POA: Diagnosis not present

## 2018-04-27 DIAGNOSIS — R2681 Unsteadiness on feet: Secondary | ICD-10-CM | POA: Diagnosis not present

## 2018-04-27 DIAGNOSIS — C7931 Secondary malignant neoplasm of brain: Secondary | ICD-10-CM | POA: Diagnosis not present

## 2018-04-27 DIAGNOSIS — C3491 Malignant neoplasm of unspecified part of right bronchus or lung: Secondary | ICD-10-CM | POA: Diagnosis not present

## 2018-04-27 DIAGNOSIS — F17210 Nicotine dependence, cigarettes, uncomplicated: Secondary | ICD-10-CM | POA: Diagnosis not present

## 2018-04-27 DIAGNOSIS — F419 Anxiety disorder, unspecified: Secondary | ICD-10-CM | POA: Diagnosis not present

## 2018-04-28 DIAGNOSIS — C3491 Malignant neoplasm of unspecified part of right bronchus or lung: Secondary | ICD-10-CM | POA: Diagnosis not present

## 2018-04-28 DIAGNOSIS — C7951 Secondary malignant neoplasm of bone: Secondary | ICD-10-CM | POA: Diagnosis not present

## 2018-04-28 DIAGNOSIS — E441 Mild protein-calorie malnutrition: Secondary | ICD-10-CM | POA: Diagnosis not present

## 2018-04-28 DIAGNOSIS — F17210 Nicotine dependence, cigarettes, uncomplicated: Secondary | ICD-10-CM | POA: Diagnosis not present

## 2018-04-28 DIAGNOSIS — I10 Essential (primary) hypertension: Secondary | ICD-10-CM | POA: Diagnosis not present

## 2018-04-28 DIAGNOSIS — F419 Anxiety disorder, unspecified: Secondary | ICD-10-CM | POA: Diagnosis not present

## 2018-04-28 DIAGNOSIS — C7931 Secondary malignant neoplasm of brain: Secondary | ICD-10-CM | POA: Diagnosis not present

## 2018-04-28 DIAGNOSIS — R2681 Unsteadiness on feet: Secondary | ICD-10-CM | POA: Diagnosis not present

## 2018-04-28 DIAGNOSIS — E1149 Type 2 diabetes mellitus with other diabetic neurological complication: Secondary | ICD-10-CM | POA: Diagnosis not present

## 2018-04-30 DIAGNOSIS — E441 Mild protein-calorie malnutrition: Secondary | ICD-10-CM | POA: Diagnosis not present

## 2018-04-30 DIAGNOSIS — C7931 Secondary malignant neoplasm of brain: Secondary | ICD-10-CM | POA: Diagnosis not present

## 2018-04-30 DIAGNOSIS — C3491 Malignant neoplasm of unspecified part of right bronchus or lung: Secondary | ICD-10-CM | POA: Diagnosis not present

## 2018-04-30 DIAGNOSIS — C7951 Secondary malignant neoplasm of bone: Secondary | ICD-10-CM | POA: Diagnosis not present

## 2018-04-30 DIAGNOSIS — I10 Essential (primary) hypertension: Secondary | ICD-10-CM | POA: Diagnosis not present

## 2018-04-30 DIAGNOSIS — F17210 Nicotine dependence, cigarettes, uncomplicated: Secondary | ICD-10-CM | POA: Diagnosis not present

## 2018-04-30 DIAGNOSIS — E1149 Type 2 diabetes mellitus with other diabetic neurological complication: Secondary | ICD-10-CM | POA: Diagnosis not present

## 2018-04-30 DIAGNOSIS — F419 Anxiety disorder, unspecified: Secondary | ICD-10-CM | POA: Diagnosis not present

## 2018-04-30 DIAGNOSIS — R2681 Unsteadiness on feet: Secondary | ICD-10-CM | POA: Diagnosis not present

## 2018-05-01 ENCOUNTER — Emergency Department (HOSPITAL_COMMUNITY): Payer: Medicare HMO

## 2018-05-01 ENCOUNTER — Encounter (HOSPITAL_COMMUNITY): Payer: Self-pay | Admitting: Emergency Medicine

## 2018-05-01 ENCOUNTER — Inpatient Hospital Stay (HOSPITAL_COMMUNITY)
Admission: EM | Admit: 2018-05-01 | Discharge: 2018-05-03 | DRG: 178 | Disposition: A | Discharge status: Home / Self Care | Payer: Medicare HMO | Attending: Internal Medicine | Admitting: Internal Medicine

## 2018-05-01 ENCOUNTER — Other Ambulatory Visit: Payer: Self-pay

## 2018-05-01 DIAGNOSIS — E876 Hypokalemia: Secondary | ICD-10-CM | POA: Diagnosis not present

## 2018-05-01 DIAGNOSIS — R7303 Prediabetes: Secondary | ICD-10-CM | POA: Diagnosis present

## 2018-05-01 DIAGNOSIS — I10 Essential (primary) hypertension: Secondary | ICD-10-CM | POA: Diagnosis present

## 2018-05-01 DIAGNOSIS — C7951 Secondary malignant neoplasm of bone: Secondary | ICD-10-CM | POA: Diagnosis not present

## 2018-05-01 DIAGNOSIS — J690 Pneumonitis due to inhalation of food and vomit: Principal | ICD-10-CM | POA: Diagnosis present

## 2018-05-01 DIAGNOSIS — C7801 Secondary malignant neoplasm of right lung: Secondary | ICD-10-CM | POA: Diagnosis present

## 2018-05-01 DIAGNOSIS — Z7982 Long term (current) use of aspirin: Secondary | ICD-10-CM | POA: Diagnosis not present

## 2018-05-01 DIAGNOSIS — Z87891 Personal history of nicotine dependence: Secondary | ICD-10-CM

## 2018-05-01 DIAGNOSIS — C3492 Malignant neoplasm of unspecified part of left bronchus or lung: Secondary | ICD-10-CM | POA: Diagnosis not present

## 2018-05-01 DIAGNOSIS — C801 Malignant (primary) neoplasm, unspecified: Secondary | ICD-10-CM | POA: Diagnosis present

## 2018-05-01 DIAGNOSIS — D709 Neutropenia, unspecified: Secondary | ICD-10-CM | POA: Diagnosis present

## 2018-05-01 DIAGNOSIS — Z79899 Other long term (current) drug therapy: Secondary | ICD-10-CM | POA: Diagnosis not present

## 2018-05-01 DIAGNOSIS — L8990 Pressure ulcer of unspecified site, unspecified stage: Secondary | ICD-10-CM

## 2018-05-01 DIAGNOSIS — Z90710 Acquired absence of both cervix and uterus: Secondary | ICD-10-CM | POA: Diagnosis not present

## 2018-05-01 DIAGNOSIS — C3490 Malignant neoplasm of unspecified part of unspecified bronchus or lung: Secondary | ICD-10-CM | POA: Diagnosis present

## 2018-05-01 DIAGNOSIS — C3412 Malignant neoplasm of upper lobe, left bronchus or lung: Secondary | ICD-10-CM | POA: Diagnosis not present

## 2018-05-01 DIAGNOSIS — R05 Cough: Secondary | ICD-10-CM | POA: Diagnosis not present

## 2018-05-01 DIAGNOSIS — J189 Pneumonia, unspecified organism: Secondary | ICD-10-CM | POA: Diagnosis present

## 2018-05-01 DIAGNOSIS — R918 Other nonspecific abnormal finding of lung field: Secondary | ICD-10-CM | POA: Diagnosis not present

## 2018-05-01 DIAGNOSIS — I4510 Unspecified right bundle-branch block: Secondary | ICD-10-CM | POA: Diagnosis not present

## 2018-05-01 DIAGNOSIS — Z8249 Family history of ischemic heart disease and other diseases of the circulatory system: Secondary | ICD-10-CM

## 2018-05-01 DIAGNOSIS — T17908S Unspecified foreign body in respiratory tract, part unspecified causing other injury, sequela: Secondary | ICD-10-CM | POA: Diagnosis not present

## 2018-05-01 DIAGNOSIS — R0689 Other abnormalities of breathing: Secondary | ICD-10-CM | POA: Diagnosis not present

## 2018-05-01 DIAGNOSIS — Z885 Allergy status to narcotic agent status: Secondary | ICD-10-CM

## 2018-05-01 DIAGNOSIS — D696 Thrombocytopenia, unspecified: Secondary | ICD-10-CM | POA: Diagnosis present

## 2018-05-01 DIAGNOSIS — C7931 Secondary malignant neoplasm of brain: Secondary | ICD-10-CM | POA: Diagnosis not present

## 2018-05-01 DIAGNOSIS — E785 Hyperlipidemia, unspecified: Secondary | ICD-10-CM | POA: Diagnosis present

## 2018-05-01 DIAGNOSIS — R0600 Dyspnea, unspecified: Secondary | ICD-10-CM | POA: Diagnosis not present

## 2018-05-01 DIAGNOSIS — C7989 Secondary malignant neoplasm of other specified sites: Secondary | ICD-10-CM | POA: Diagnosis present

## 2018-05-01 DIAGNOSIS — R0989 Other specified symptoms and signs involving the circulatory and respiratory systems: Secondary | ICD-10-CM | POA: Diagnosis not present

## 2018-05-01 DIAGNOSIS — Z720 Tobacco use: Secondary | ICD-10-CM | POA: Diagnosis present

## 2018-05-01 DIAGNOSIS — C3402 Malignant neoplasm of left main bronchus: Secondary | ICD-10-CM

## 2018-05-01 DIAGNOSIS — R0602 Shortness of breath: Secondary | ICD-10-CM | POA: Diagnosis not present

## 2018-05-01 DIAGNOSIS — R0902 Hypoxemia: Secondary | ICD-10-CM | POA: Diagnosis not present

## 2018-05-01 LAB — CBC WITH DIFFERENTIAL/PLATELET
Abs Immature Granulocytes: 0.01 10*3/uL (ref 0.00–0.07)
Basophils Absolute: 0 10*3/uL (ref 0.0–0.1)
Basophils Relative: 0 %
Eosinophils Absolute: 0 10*3/uL (ref 0.0–0.5)
Eosinophils Relative: 0 %
HCT: 33.7 % — ABNORMAL LOW (ref 36.0–46.0)
Hemoglobin: 9.7 g/dL — ABNORMAL LOW (ref 12.0–15.0)
Immature Granulocytes: 1 %
Lymphocytes Relative: 30 %
Lymphs Abs: 0.2 10*3/uL — ABNORMAL LOW (ref 0.7–4.0)
MCH: 23.3 pg — AB (ref 26.0–34.0)
MCHC: 28.8 g/dL — ABNORMAL LOW (ref 30.0–36.0)
MCV: 81 fL (ref 80.0–100.0)
Monocytes Absolute: 0.3 10*3/uL (ref 0.1–1.0)
Monocytes Relative: 39 %
Neutro Abs: 0.2 10*3/uL — ABNORMAL LOW (ref 1.7–7.7)
Neutrophils Relative %: 30 %
Platelets: 55 10*3/uL — ABNORMAL LOW (ref 150–400)
RBC: 4.16 MIL/uL (ref 3.87–5.11)
RDW: 15.8 % — ABNORMAL HIGH (ref 11.5–15.5)
WBC: 0.8 10*3/uL — AB (ref 4.0–10.5)
nRBC: 0 % (ref 0.0–0.2)

## 2018-05-01 LAB — COMPREHENSIVE METABOLIC PANEL
ALT: 17 U/L (ref 0–44)
AST: 29 U/L (ref 15–41)
Albumin: 2.7 g/dL — ABNORMAL LOW (ref 3.5–5.0)
Alkaline Phosphatase: 67 U/L (ref 38–126)
Anion gap: 12 (ref 5–15)
BUN: 14 mg/dL (ref 8–23)
CHLORIDE: 100 mmol/L (ref 98–111)
CO2: 24 mmol/L (ref 22–32)
Calcium: 8.4 mg/dL — ABNORMAL LOW (ref 8.9–10.3)
Creatinine, Ser: 0.6 mg/dL (ref 0.44–1.00)
GFR calc Af Amer: >60 mL/min (ref >60)
GFR calc non Af Amer: >60 mL/min (ref >60)
Glucose, Bld: 147 mg/dL — ABNORMAL HIGH (ref 70–99)
Potassium: 3.1 mmol/L — ABNORMAL LOW (ref 3.5–5.1)
SODIUM: 136 mmol/L (ref 135–145)
Total Bilirubin: 0.8 mg/dL (ref 0.3–1.2)
Total Protein: 7.4 g/dL (ref 6.5–8.1)

## 2018-05-01 MED ORDER — SODIUM CHLORIDE 0.9 % IV SOLN
INTRAVENOUS | Status: DC
  Administered 2018-05-01 – 2018-05-02 (×2): via INTRAVENOUS

## 2018-05-01 MED ORDER — SODIUM CHLORIDE 0.9 % IV BOLUS
500.0000 mL | Freq: Once | INTRAVENOUS | Status: AC
  Administered 2018-05-01: 500 mL via INTRAVENOUS

## 2018-05-01 NOTE — ED Notes (Signed)
Pt has pulled out her IV

## 2018-05-01 NOTE — ED Provider Notes (Signed)
[facility] EMERGENCY DEPARTMENT Provider Note   CSN: [id number] Arrival date & time: 05/01/18  1928    History   Chief Complaint Chief Complaint  Patient presents with  . Shortness of Breath    HPI Melinda Larsen is a 78 y.o. female.     Patient brought in by EMS at the request of the family.  Apparently primary care doctor there was some concerns about some shortness of breath so a truck went to the house to do a chest x-ray which raise concerns for pneumonia.  Patient sent in.  Patient without any specific complaints she does have known metastatic lung cancer with a known left hilar mass.  She is undergoing chemotherapy.  Last dose of chemotherapy was March 17.  She is followed by hematology oncology upstairs.  Patient without any specific complaints.  Patient is a full code.  Patient denies any fevers.  Denies anybody being sick at home.  No known coronavirus exposure.     Past Medical History:  Diagnosis Date  . Anemia   . Borderline diabetes   . Dyspnea   . Hyperlipidemia   . Hypertension   . Lung cancer ([place]) 12/23/2017  . Metastasis to bone ([place]) 03/24/2018  . Pneumonia   . Pre-diabetes     Patient Active Problem List   Diagnosis Date Noted  . Tobacco abuse 05/02/2018  . Adenocarcinoma ([place]) metastatic of lung 05/02/2018  . Non-small cell lung cancer (NSCLC) ([place]) 05/02/2018  . Post-obstructive pneumonia due to foreign body aspiration 05/02/2018  . Metastasis to bone ([place]) 03/24/2018  . Brain metastasis ([place]) 03/10/2018  . Lung cancer ([place]) 12/23/2017  . Special screening for malignant neoplasms, colon     Past Surgical History:  Procedure Laterality Date  . ABDOMINAL HYSTERECTOMY    . APPENDECTOMY    . COLONOSCOPY N/A 07/16/2017   Procedure: COLONOSCOPY;  Surgeon: Danie Binder, MD;  Location: [facility] ENDO SUITE;  Service: Endoscopy;  Laterality: N/A;  10:30  . IR IMAGING GUIDED PORT INSERTION  12/24/2017  . POLYPECTOMY  07/16/2017   Procedure:  POLYPECTOMY;  Surgeon: Danie Binder, MD;  Location: [facility] ENDO SUITE;  Service: Endoscopy;;  cecal x2;hepatic flexurex5; splenic x1;descending x2  . VIDEO BRONCHOSCOPY WITH ENDOBRONCHIAL ULTRASOUND N/A 12/17/2017   Procedure: VIDEO BRONCHOSCOPY WITH ENDOBRONCHIAL ULTRASOUND;  Surgeon: Melrose Nakayama, MD;  Location: [place];  Service: Thoracic;  Laterality: N/A;     OB History   No obstetric history on file.      Home Medications    Prior to Admission medications   Medication Sig Start Date End Date Taking? Authorizing Provider  Accu-Chek FastClix Lancets MISC  04/17/18   [provider]  ACCU-CHEK GUIDE test strip  04/17/18   [provider]  albuterol (PROVENTIL HFA;VENTOLIN HFA) 108 (90 Base) MCG/ACT inhaler Inhale 2 puffs into the lungs every 4 (four) hours as needed for wheezing or shortness of breath (or coughing). 11/16/17   Francine Graven, DO  amLODipine (NORVASC) 5 MG tablet Take 5 mg by mouth every evening.     [provider]  aspirin EC 81 MG tablet Take 81 mg by mouth every evening.     [provider]  Blood Glucose Monitoring Suppl (ACCU-CHEK GUIDE) w/Device KIT  04/17/18   [provider]  dexamethasone (DECADRON) 4 MG tablet Take 1 tab two times a day the day before Alimta chemo, then take 2 tabs once a day for 3 days starting the day after chemo.  03/30/18   Higgs, Mathis Dad, MD  folic acid (FOLVITE) 1 MG tablet Take 1 tablet (1 mg total) by mouth daily. Start 5-7 days before Alimta chemotherapy. Continue until 21 days after Alimta completed. 12/23/17   Higgs, Mathis Dad, MD  HYDROcodone-acetaminophen (NORCO/VICODIN) 5-325 MG tablet Take 1 tablet by mouth every 4 (four) hours as needed for moderate pain. 1 or 2 tabs PO q4 hours prn pain 03/31/18   Higgs, Mathis Dad, MD  lisinopril (PRINIVIL,ZESTRIL) 5 MG tablet Take 5 mg by mouth every evening.     [provider]  mirtazapine (REMERON) 15 MG tablet Take 1 tablet (15 mg total) by  mouth at bedtime. 04/21/18   Derek Jack, MD  simvastatin (ZOCOR) 40 MG tablet Take 40 mg by mouth every evening.     [provider]    Family History Family History  Problem Relation Age of Onset  . Hypertension Mother   . Hypertension Father   . Colon cancer Neg Hx   . Colon polyps Neg Hx     Social History Social History   Tobacco Use  . Smoking status: Former Smoker    Packs/day: 1.00    Years: 40.00    Pack years: 40.00    Types: Cigarettes    Last attempt to quit: 12/09/2012    Years since quitting: 5.3  . Smokeless tobacco: Never Used  . Tobacco comment: off and on since her teens  Substance Use Topics  . Alcohol use: No  . Drug use: No     Allergies   Codeine   Review of Systems Review of Systems  Constitutional: Positive for appetite change and fatigue. Negative for chills and fever.  HENT: Negative for rhinorrhea and sore throat.   Eyes: Negative for visual disturbance.  Respiratory: Positive for shortness of breath. Negative for cough.   Cardiovascular: Negative for chest pain and leg swelling.  Gastrointestinal: Negative for abdominal pain, diarrhea, nausea and vomiting.  Genitourinary: Negative for dysuria.  Musculoskeletal: Negative for back pain and neck pain.  Skin: Negative for rash.  Neurological: Positive for weakness. Negative for dizziness, light-headedness and headaches.  Hematological: Does not bruise/bleed easily.  Psychiatric/Behavioral: Negative for confusion.     Physical Exam Updated Vital Signs BP (!) 160/63   Pulse 71   Temp 99.1 F (37.3 C) (Oral)   Resp (!) 23   Ht 1.778 m ('5\' 10"'$ )   Wt 53.8 kg   SpO2 94%   BMI 17.02 kg/m   Physical Exam Vitals signs and nursing note reviewed.  Constitutional:      General: She is not in acute distress.    Appearance: She is well-developed. She is ill-appearing.  HENT:     Head: Normocephalic and atraumatic.     Nose: No congestion.  Eyes:     Extraocular  Movements: Extraocular movements intact.     Conjunctiva/sclera: Conjunctivae normal.     Pupils: Pupils are equal, round, and reactive to light.  Neck:     Musculoskeletal: Neck supple.  Cardiovascular:     Rate and Rhythm: Normal rate and regular rhythm.     Heart sounds: No murmur.  Pulmonary:     Effort: Pulmonary effort is normal. No respiratory distress.     Breath sounds: Rhonchi and rales present.  Abdominal:     Palpations: Abdomen is soft.     Tenderness: There is no abdominal tenderness.  Musculoskeletal: Normal range of motion.        General: No swelling.  Skin:  General: Skin is warm and dry.  Neurological:     General: No focal deficit present.     Mental Status: She is alert and oriented to person, place, and time.      ED Treatments / Results  Labs (all labs ordered are listed, but only abnormal results are displayed) Labs Reviewed  CBC WITH DIFFERENTIAL/PLATELET - Abnormal; Notable for the following components:      Result Value   WBC 0.8 (*)    Hemoglobin 9.7 (*)    HCT 33.7 (*)    MCH 23.3 (*)    MCHC 28.8 (*)    RDW 15.8 (*)    Platelets 55 (*)    Neutro Abs 0.2 (*)    Lymphs Abs 0.2 (*)    All other components within normal limits  COMPREHENSIVE METABOLIC PANEL - Abnormal; Notable for the following components:   Potassium 3.1 (*)    Glucose, Bld 147 (*)    Calcium 8.4 (*)    Albumin 2.7 (*)    All other components within normal limits    EKG EKG Interpretation  Date/Time:  Friday May 01 2018 19:40:56 EDT Ventricular Rate:  79 PR Interval:    QRS Duration: 142 QT Interval:  396 QTC Calculation: 454 R Axis:   95 Text Interpretation:  RBBB and LPFB Baseline wander in lead(s) II Reconfirmed by Fredia Sorrow [phone]) on 05/01/2018 8:42:44 PM   Radiology Ct Chest Wo Contrast  Result Date: 05/01/2018 CLINICAL DATA:  Dyspnea, chronic, neg or nondiagnostic xray. Lung cancer, last chemotherapy last week. EXAM: CT CHEST WITHOUT CONTRAST  TECHNIQUE: Multidetector CT imaging of the chest was performed following the standard protocol without IV contrast. COMPARISON:  Chest radiograph earlier this day.  PET-CT 03/12/2018 FINDINGS: Cardiovascular: Right chest port with tip in the lower SVC. Atherosclerosis of the thoracic aorta. Vascular structures not well assessed in the absence of IV contrast. Cardiomegaly. Trace pericardial effusion. Mediastinum/Nodes: Left suprahilar mass contiguous with the mediastinum. This appears subjectively larger than on prior exam measuring 5.6 x 4.0 cm, previously 4.5 x 3.9 cm. This is contiguous with left AP window and periaortic adenopathy, extent not well-defined in the absence of IV contrast. The esophagus is decompressed. Lungs/Pleura: Breathing motion artifact limits assessment. Emphysema. Left suprahilar mass contiguous with the mediastinum, subject ovary larger measuring 5.6 x 4.0 cm, previously 4.5 x 3.9 cm. Nodule anteriorly measuring 1.2 x 1.0 cm is unchanged. There is progressive bronchial filling in the left lung with bronchial filling/mucous plugging and associated patchy and ground-glass opacities in the left upper and lower lobes. Subpleural nodule in the anterior right upper lobe measures 4 mm, image 70 series 4, unchanged allowing for differences in technique. Probable dependent atelectasis in the right lung, partially obscured by motion. Minimal patchy opacity in the right upper lobe, image 63 series 4. Small left pleural effusion is new. Subpleural blebs in the left lower lobe as before. Left lower lobe calcified granuloma. Upper Abdomen: Motion limited.  No gross acute finding. Musculoskeletal: Lucency in the left eleventh rib corresponding to hypermetabolism on prior PET consistent with bony metastasis. New lytic lesion involving the right lateral fourth rib. IMPRESSION: 1. Progressive bronchial thickening in the left upper and lower lobes with bronchial filling, likely infectious or inflammatory  rather than neoplastic. Associated patchy and ground-glass opacities suspicious for postobstructive pneumonia. 2. Minimal patchy opacity in the right upper lobe suspicious for pneumonia. Dependent right lower lobe opacity may be pneumonia or hypoventilatory change. 3. Left suprahilar mass  is subjectively slightly larger from March 12, 2018 PET-CT. 4. New bone metastasis in the right fourth rib. 5. Aortic Atherosclerosis (ICD10-I70.0) and Emphysema (ICD10-J43.9). Electronically Signed   By: Keith Rake M.D.   On: 05/01/2018 22:15   Dg Chest Portable 1 View  Result Date: 05/01/2018 CLINICAL DATA:  Chest x-ray today, pneumonia, lung cancer EXAM: PORTABLE CHEST 1 VIEW COMPARISON:  12/17/2017 FINDINGS: Mild cardiomegaly. A left hilar mass is present although better appreciated by prior CT. There is diffuse bilateral interstitial pulmonary opacity and a probable small left pleural effusion. Right chest port catheter. IMPRESSION: Mild cardiomegaly. A left hilar mass is present although better appreciated by prior CT. There is diffuse bilateral interstitial pulmonary opacity and a probable small left pleural effusion. Findings are concerning for infection and/or edema. CT may be helpful to evaluate for acute airspace disease given the presence of malignancy. Electronically Signed   By: Eddie Candle M.D.   On: 05/01/2018 20:42    Procedures Procedures (including critical care time)  Medications Ordered in ED Medications  0.9 %  sodium chloride infusion ( Intravenous Restarted 05/02/18 0017)  sodium chloride 0.9 % bolus 500 mL (0 mLs Intravenous Stopped 05/02/18 0017)     Initial Impression / Assessment and Plan / ED Course  I have reviewed the triage vital signs and the nursing notes.  Pertinent labs & imaging results that were available during my care of the patient were reviewed by me and considered in my medical decision making (see chart for details).       Patient chest x-ray with  questionable findings.  So CT chest was done without contrast.  Shows the left hilar mass.  Basically shows bilateral pneumonias.  Mostly on the left side and some in the right upper lobe area.  Patient's white blood cell count is 0.8.  With decreased absolute neutrophil count.  Findings consistent with multilobar pneumonia.  Based on her chemotherapy this would be health quired associated pneumonia.  Also shows the left hilar lung mass.  And there is also evidence of some bony mets.  Discussed with the hospitalist.  Let him know that had not started antibiotics yet.  He will make decision on final antibiotics.  Will order them.  He will admit her.  Despite the CT findings of the chest patient is not in any acute respiratory distress currently.  Patient does use oxygen at home.  On 2 L of oxygen here her sats are in the upper 90s.   Final Clinical Impressions(s) / ED Diagnoses   Final diagnoses:  HCAP (healthcare-associated pneumonia)  Malignant neoplasm of hilus of left lung [facility])    ED Discharge Orders    None       Fredia Sorrow, MD 05/02/18 [phone]

## 2018-05-01 NOTE — ED Notes (Signed)
Received report on pt, pt lying on right side, reports that she feels fair, iv restarted in left forearm, pt had pulled previous iv out, fluids restarted per order, pt updated on plan of care, is able to state the year, when asked the month pt stated the year again and when asked where she was pt stated that she was at [place],

## 2018-05-01 NOTE — ED Notes (Signed)
Pt is a former smoker who has Lung Ca last chemo 3/17 She was seen and XR ordered by Dr Gerarda Fraction who snet an xray maching to "my house" and called her to tell her she had pneumonia  Family Called EMS because they "want her checked out"

## 2018-05-01 NOTE — ED Triage Notes (Signed)
Sent from Dr Fusco's office this am for CRX Found to have pneumonia  Hx Lung Ca  Last chemo 3/17  Sent back due to family wants her checked out

## 2018-05-01 NOTE — ED Notes (Signed)
Date and time results received: 05/01/18 2040 (use smartphrase ".now" to insert current time)  Test: WBC  Critical Value: 0.8  Name of Provider Notified: Dr Rogene Houston  Orders Received? Or Actions Taken?:

## 2018-05-02 ENCOUNTER — Encounter (HOSPITAL_COMMUNITY): Payer: Self-pay

## 2018-05-02 ENCOUNTER — Other Ambulatory Visit: Payer: Self-pay

## 2018-05-02 DIAGNOSIS — Z90710 Acquired absence of both cervix and uterus: Secondary | ICD-10-CM | POA: Diagnosis not present

## 2018-05-02 DIAGNOSIS — C7931 Secondary malignant neoplasm of brain: Secondary | ICD-10-CM | POA: Diagnosis present

## 2018-05-02 DIAGNOSIS — I10 Essential (primary) hypertension: Secondary | ICD-10-CM | POA: Diagnosis present

## 2018-05-02 DIAGNOSIS — D696 Thrombocytopenia, unspecified: Secondary | ICD-10-CM | POA: Diagnosis present

## 2018-05-02 DIAGNOSIS — C7989 Secondary malignant neoplasm of other specified sites: Secondary | ICD-10-CM | POA: Diagnosis present

## 2018-05-02 DIAGNOSIS — D709 Neutropenia, unspecified: Secondary | ICD-10-CM | POA: Diagnosis present

## 2018-05-02 DIAGNOSIS — Z8249 Family history of ischemic heart disease and other diseases of the circulatory system: Secondary | ICD-10-CM | POA: Diagnosis not present

## 2018-05-02 DIAGNOSIS — Z885 Allergy status to narcotic agent status: Secondary | ICD-10-CM | POA: Diagnosis not present

## 2018-05-02 DIAGNOSIS — J189 Pneumonia, unspecified organism: Secondary | ICD-10-CM | POA: Diagnosis present

## 2018-05-02 DIAGNOSIS — E876 Hypokalemia: Secondary | ICD-10-CM | POA: Diagnosis present

## 2018-05-02 DIAGNOSIS — C7951 Secondary malignant neoplasm of bone: Secondary | ICD-10-CM | POA: Diagnosis present

## 2018-05-02 DIAGNOSIS — Z720 Tobacco use: Secondary | ICD-10-CM | POA: Diagnosis present

## 2018-05-02 DIAGNOSIS — C3412 Malignant neoplasm of upper lobe, left bronchus or lung: Secondary | ICD-10-CM | POA: Diagnosis present

## 2018-05-02 DIAGNOSIS — Z7982 Long term (current) use of aspirin: Secondary | ICD-10-CM | POA: Diagnosis not present

## 2018-05-02 DIAGNOSIS — R7303 Prediabetes: Secondary | ICD-10-CM | POA: Diagnosis present

## 2018-05-02 DIAGNOSIS — Z79899 Other long term (current) drug therapy: Secondary | ICD-10-CM | POA: Diagnosis not present

## 2018-05-02 DIAGNOSIS — C801 Malignant (primary) neoplasm, unspecified: Secondary | ICD-10-CM | POA: Diagnosis present

## 2018-05-02 DIAGNOSIS — E785 Hyperlipidemia, unspecified: Secondary | ICD-10-CM | POA: Diagnosis present

## 2018-05-02 DIAGNOSIS — C3490 Malignant neoplasm of unspecified part of unspecified bronchus or lung: Secondary | ICD-10-CM | POA: Diagnosis present

## 2018-05-02 DIAGNOSIS — Z87891 Personal history of nicotine dependence: Secondary | ICD-10-CM | POA: Diagnosis not present

## 2018-05-02 DIAGNOSIS — T17908S Unspecified foreign body in respiratory tract, part unspecified causing other injury, sequela: Secondary | ICD-10-CM | POA: Diagnosis not present

## 2018-05-02 DIAGNOSIS — C7801 Secondary malignant neoplasm of right lung: Secondary | ICD-10-CM | POA: Diagnosis present

## 2018-05-02 DIAGNOSIS — J690 Pneumonitis due to inhalation of food and vomit: Secondary | ICD-10-CM | POA: Diagnosis present

## 2018-05-02 DIAGNOSIS — C3402 Malignant neoplasm of left main bronchus: Secondary | ICD-10-CM

## 2018-05-02 DIAGNOSIS — L8990 Pressure ulcer of unspecified site, unspecified stage: Secondary | ICD-10-CM | POA: Diagnosis present

## 2018-05-02 LAB — CBC WITH DIFFERENTIAL/PLATELET
Abs Immature Granulocytes: 0.06 10*3/uL (ref 0.00–0.07)
Basophils Absolute: 0 10*3/uL (ref 0.0–0.1)
Basophils Relative: 1 %
Eosinophils Absolute: 0 10*3/uL (ref 0.0–0.5)
Eosinophils Relative: 0 %
HCT: 27.9 % — ABNORMAL LOW (ref 36.0–46.0)
Hemoglobin: 8 g/dL — ABNORMAL LOW (ref 12.0–15.0)
Immature Granulocytes: 5 %
Lymphocytes Relative: 26 %
Lymphs Abs: 0.3 10*3/uL — ABNORMAL LOW (ref 0.7–4.0)
MCH: 23.1 pg — ABNORMAL LOW (ref 26.0–34.0)
MCHC: 28.7 g/dL — ABNORMAL LOW (ref 30.0–36.0)
MCV: 80.4 fL (ref 80.0–100.0)
Monocytes Absolute: 0.6 10*3/uL (ref 0.1–1.0)
Monocytes Relative: 47 %
Neutro Abs: 0.3 10*3/uL — ABNORMAL LOW (ref 1.7–7.7)
Neutrophils Relative %: 21 %
Platelets: 44 10*3/uL — ABNORMAL LOW (ref 150–400)
RBC: 3.47 MIL/uL — ABNORMAL LOW (ref 3.87–5.11)
RDW: 15.8 % — ABNORMAL HIGH (ref 11.5–15.5)
WBC: 1.2 10*3/uL — CL (ref 4.0–10.5)
nRBC: 0 % (ref 0.0–0.2)

## 2018-05-02 LAB — BASIC METABOLIC PANEL
Anion gap: 7 (ref 5–15)
BUN: 12 mg/dL (ref 8–23)
CO2: 24 mmol/L (ref 22–32)
Calcium: 7.7 mg/dL — ABNORMAL LOW (ref 8.9–10.3)
Chloride: 107 mmol/L (ref 98–111)
Creatinine, Ser: 0.43 mg/dL — ABNORMAL LOW (ref 0.44–1.00)
GFR calc Af Amer: >60 mL/min (ref >60)
GFR calc non Af Amer: >60 mL/min (ref >60)
Glucose, Bld: 120 mg/dL — ABNORMAL HIGH (ref 70–99)
Potassium: 2.9 mmol/L — ABNORMAL LOW (ref 3.5–5.1)
Sodium: 138 mmol/L (ref 135–145)

## 2018-05-02 LAB — INFLUENZA PANEL BY PCR (TYPE A & B)
INFLAPCR: NEGATIVE
Influenza B By PCR: NEGATIVE

## 2018-05-02 LAB — MRSA PCR SCREENING: MRSA by PCR: NEGATIVE

## 2018-05-02 LAB — MAGNESIUM: Magnesium: 1.3 mg/dL — ABNORMAL LOW (ref 1.7–2.4)

## 2018-05-02 LAB — PHOSPHORUS: Phosphorus: 1.2 mg/dL — ABNORMAL LOW (ref 2.5–4.6)

## 2018-05-02 MED ORDER — POTASSIUM CHLORIDE CRYS ER 20 MEQ PO TBCR
40.0000 meq | EXTENDED_RELEASE_TABLET | Freq: Two times a day (BID) | ORAL | Status: DC
  Administered 2018-05-02 – 2018-05-03 (×3): 40 meq via ORAL
  Filled 2018-05-02 (×3): qty 2

## 2018-05-02 MED ORDER — ENSURE ENLIVE PO LIQD
237.0000 mL | Freq: Three times a day (TID) | ORAL | Status: DC
  Administered 2018-05-02 – 2018-05-03 (×4): 237 mL via ORAL

## 2018-05-02 MED ORDER — SODIUM CHLORIDE 0.9 % IV SOLN
1.0000 g | Freq: Three times a day (TID) | INTRAVENOUS | Status: DC
  Administered 2018-05-02: 1 g via INTRAVENOUS
  Filled 2018-05-02 (×2): qty 1

## 2018-05-02 MED ORDER — MAGNESIUM SULFATE 2 GM/50ML IV SOLN
2.0000 g | Freq: Once | INTRAVENOUS | Status: AC
  Administered 2018-05-02: 2 g via INTRAVENOUS
  Filled 2018-05-02: qty 50

## 2018-05-02 MED ORDER — IPRATROPIUM-ALBUTEROL 0.5-2.5 (3) MG/3ML IN SOLN
3.0000 mL | Freq: Four times a day (QID) | RESPIRATORY_TRACT | Status: DC
  Administered 2018-05-02: 3 mL via RESPIRATORY_TRACT
  Filled 2018-05-02 (×2): qty 3

## 2018-05-02 MED ORDER — IPRATROPIUM-ALBUTEROL 20-100 MCG/ACT IN AERS
1.0000 | INHALATION_SPRAY | Freq: Four times a day (QID) | RESPIRATORY_TRACT | Status: DC | PRN
  Filled 2018-05-02: qty 4

## 2018-05-02 MED ORDER — ENOXAPARIN SODIUM 40 MG/0.4ML ~~LOC~~ SOLN
40.0000 mg | SUBCUTANEOUS | Status: DC
  Administered 2018-05-02: 40 mg via SUBCUTANEOUS
  Filled 2018-05-02: qty 0.4

## 2018-05-02 MED ORDER — SODIUM CHLORIDE 0.9 % IV SOLN
2.0000 g | INTRAVENOUS | Status: DC
  Administered 2018-05-02: 2 g via INTRAVENOUS
  Filled 2018-05-02: qty 2

## 2018-05-02 MED ORDER — VANCOMYCIN HCL IN DEXTROSE 1-5 GM/200ML-% IV SOLN
1000.0000 mg | Freq: Once | INTRAVENOUS | Status: AC
  Administered 2018-05-02: 1000 mg via INTRAVENOUS
  Filled 2018-05-02: qty 200

## 2018-05-02 MED ORDER — VANCOMYCIN HCL IN DEXTROSE 750-5 MG/150ML-% IV SOLN
750.0000 mg | INTRAVENOUS | Status: DC
  Administered 2018-05-03: 750 mg via INTRAVENOUS
  Filled 2018-05-02: qty 150

## 2018-05-02 MED ORDER — TBO-FILGRASTIM 300 MCG/0.5ML ~~LOC~~ SOSY
300.0000 ug | PREFILLED_SYRINGE | Freq: Once | SUBCUTANEOUS | Status: AC
  Administered 2018-05-02: 300 ug via SUBCUTANEOUS
  Filled 2018-05-02: qty 0.5

## 2018-05-02 MED ORDER — METHYLPREDNISOLONE SODIUM SUCC 125 MG IJ SOLR
60.0000 mg | Freq: Every day | INTRAMUSCULAR | Status: DC
  Administered 2018-05-02: 60 mg via INTRAVENOUS
  Filled 2018-05-02: qty 2

## 2018-05-02 MED ORDER — POTASSIUM CHLORIDE IN NACL 20-0.45 MEQ/L-% IV SOLN
INTRAVENOUS | Status: DC
  Administered 2018-05-02 – 2018-05-03 (×2): via INTRAVENOUS
  Filled 2018-05-02 (×5): qty 1000

## 2018-05-02 MED ORDER — ORAL CARE MOUTH RINSE
15.0000 mL | Freq: Two times a day (BID) | OROMUCOSAL | Status: DC
  Administered 2018-05-02 – 2018-05-03 (×2): 15 mL via OROMUCOSAL

## 2018-05-02 MED ORDER — ENSURE ENLIVE PO LIQD
237.0000 mL | Freq: Two times a day (BID) | ORAL | Status: DC
  Administered 2018-05-02: 237 mL via ORAL

## 2018-05-02 NOTE — Progress Notes (Signed)
Per HPI: Patient brought in by EMS at the request of the family.  Apparently primary care doctor there was some concerns about some shortness of breath so a truck went to the house to do a chest x-ray which raise concerns for pneumonia.  Patient sent in.  Patient without any specific complaints she does have known metastatic lung cancer with a known left hilar mass.  She is undergoing chemotherapy.  Last dose of chemotherapy was March 17.  She is followed by hematology oncology upstairs.  Patient without any specific complaints.  Patient is a full code.  Patient denies any fevers.  Denies anybody being sick at home.  No known coronavirus exposure.  Patient was admitted with postobstructive/healthcare associated pneumonia in the setting of metastatic adenocarcinoma of the lung.  She has been started on vancomycin and cefepime and appears to have no significant respiratory distress.  She is noted to be neutropenic and follows with Dr. Delton Coombes of oncology.  She is noted to be hypomagnesemic and hypokalemic, for which I have ordered replacement.  Discontinued Lovenox and switch to SCDs given worsening thrombocytopenia.  Additionally, I have discontinued IV steroids at this time.  Will consult oncology and transfer to [place] floor.  Patient still wishes to be full code at this time.  Awaiting MRSA PCR to consider discontinuation of vancomycin.  Continue to follow a.m. labs.

## 2018-05-02 NOTE — Progress Notes (Signed)
Initial Nutrition Assessment  DOCUMENTATION CODES:  Underweight  INTERVENTION:  Ensure Enlive po TID, each supplement provides 350 kcal and 20 grams of protein  Pt is declining rapidly. Current wt is 109.5 lbs. She has lost 21 lbs in a stepwise fashion since middle of February when she was 130.3 lbs. = loss of 16% BW.  -Recommend continue palliative/GOC discussion  NUTRITION DIAGNOSIS:  Unintentional weight loss related to cancer and cancer related treatments as evidenced by loss of >15% bw in 1.5 months  GOAL:  Patient will meet greater than or equal to 90% of their needs  MONITOR:  PO intake, Supplement acceptance, Weight trends, TF tolerance, I & O's(Goals of care)  REASON FOR ASSESSMENT:  Malnutrition Screening Tool    ASSESSMENT:  78 y/o female PMHx HTN/HLD, Tobacco abuse and NSCLC ( dx in mid Oct). Currently receiving palliative chemotherapy at [facility]. Is s/p stereotactic radiation to brain lesion. Sent to ED after a cxr ordered by pcp revealed likely PNA. CT of chest in ED showed bilateral pna as well as disease progression (bony mets). Admitted for managament  RD operating remotely on weekends. Pt is well known to RD as she has [facility] just [facility] the [facility], which was time of her last treatment. At that time, pt demonstrated a troubling amount of weight loss. She was 118.8 lbs at that time, a loss of a 12 lbs from where she was a month prior. RD spoke with pt and it did not sound like her level of intake had changed all too much. RD spoke w/ oncologist about circumstances and he felt it was likely d/t disease progression/resistance to chemo. Pt was started on Remeron and educated extensively on need to change her eating habits. She was provided with supplements and written instructions.   Unfortunately, it appears patient continues to decline rapidly. Current wt is 109.5 lbs. She has now lost 21 lbs in a stepwise fashion since middle of February when she was 130.3  lbs. = loss of 16% BW. Prior to that, she had very gradually been losing weight since her dx in oct. Was 135-140 at time of dx  Will order oral supplements to support pt nutritionally as best can, but do not feel her body is metabolizing nutrition properly anymore-as evidenced by drastic wt loss w/o change in po intake. Recommend palliative consult/continued GOC discussion.   Labs: WBC: 1.2, Phos: 1.2, Mag: 1.3,  K: 2.9, Albumin:2.7 Meds: Ensure Enlive, KCL, IVF, IV abx,   Recent Labs  Lab 05/01/18 2022 05/02/18 0328 05/02/18 0329  NA 136  --  138  K 3.1*  --  2.9*  CL 100  --  107  CO2 24  --  24  BUN 14  --  12  CREATININE 0.60  --  0.43*  CALCIUM 8.4*  --  7.7*  MG  --   --  1.3*  PHOS  --  1.2*  --   GLUCOSE 147*  --  120*   NUTRITION - FOCUSED PHYSICAL EXAM: Unable to conduct at this time  Diet Order:   Diet Order            Diet regular Room service appropriate? Yes; Fluid consistency: Thin  Diet effective now             EDUCATION NEEDS:  No education needs have been identified at this time  Skin:  Skin Assessment: Reviewed RN Assessment  Last BM:  Unknown  Height:  Ht Readings  from Last 1 Encounters:  05/02/18 5\' 10"  (1.778 m)   Weight:  Wt Readings from Last 1 Encounters:  05/02/18 49.7 kg   Wt Readings from Last 10 Encounters:  05/02/18 49.7 kg  04/21/18 53.9 kg  03/31/18 62.4 kg  03/24/18 59.1 kg  03/10/18 57.6 kg  03/03/18 60.6 kg  02/10/18 58.7 kg  01/20/18 59.8 kg  12/29/17 61.4 kg  12/24/17 64 kg   Ideal Body Weight:  68.18 kg  BMI:  Body mass index is 15.72 kg/m.  Estimated Nutritional Needs:  Kcal:  >2000 kcals (40 kcal/kg bw) Protein:  85-100g Pro (1.7-2g/kg bw) Fluid:  >1.3 L (25 ml/kg bw)  Burtis Junes RD, LDN, CNSC Clinical Nutrition Available Tues-Sat via Pager: [phone] 05/02/2018 9:52 AM

## 2018-05-02 NOTE — ED Notes (Signed)
ED TO INPATIENT HANDOFF REPORT  ED Nurse Name and Phone #: Rip Harbour RN  S Name/Age/Gender Melinda Larsen 78 y.o. female Room/Bed: APA02/APA02  Code Status   Code Status: Full Code  Home/SNF/Other {Patient oriented to person, year, unable to state location and month,  Is this baseline? unsure  Triage Complete: Triage complete  Chief Complaint SOB   Triage Note Sent from Dr Nolon Rod office this am for CRX Found to have pneumonia  Hx Lung Ca  Last chemo 3/17  Sent back due to family wants her checked out   Allergies Allergies  Allergen Reactions  . Codeine Nausea Only and Other (See Comments)    Nervous/sweating    Level of Care/Admitting Diagnosis ED Disposition    ED Disposition Condition [place] [facility] Area: [facility] [[id number]]  Level of Care: Stepdown [14]  Diagnosis: Postobstructive pneumonia [[id number]]  Admitting Physician: Allie Bossier [[id number]]  Attending Physician: Allie Bossier [[id number]]  Estimated length of stay: 5 - 7 days  Certification:: I certify this patient will need inpatient services for at least 2 midnights  PT Class (Do Not Modify): Inpatient [101]  PT Acc Code (Do Not Modify): Private [1]       B Medical/Surgery History Past Medical History:  Diagnosis Date  . Anemia   . Borderline diabetes   . Dyspnea   . Hyperlipidemia   . Hypertension   . Lung cancer ([place]) 12/23/2017  . Metastasis to bone ([place]) 03/24/2018  . Pneumonia   . Pre-diabetes    Past Surgical History:  Procedure Laterality Date  . ABDOMINAL HYSTERECTOMY    . APPENDECTOMY    . COLONOSCOPY N/A 07/16/2017   Procedure: COLONOSCOPY;  Surgeon: Danie Binder, MD;  Location[facility];  Service: Endoscopy;  Laterality: N/A;  10:30  . IR IMAGING GUIDED PORT INSERTION  12/24/2017  . POLYPECTOMY  07/16/2017   Procedure: POLYPECTOMY;  Surgeon: Danie Binder, MD;  Location: [facility] ENDO [facility];  Service: Endoscopy;;  cecal x2;hepatic flexurex5; splenic  x1;descending x2  . VIDEO BRONCHOSCOPY WITH ENDOBRONCHIAL ULTRASOUND N/A 12/17/2017   Procedure: VIDEO BRONCHOSCOPY WITH ENDOBRONCHIAL ULTRASOUND;  Surgeon: Melrose Nakayama, MD;  Location: [facility];  Service: Thoracic;  Laterality: N/A;     A IV Location/Drains/Wounds Patient Lines/Drains/Airways Status   Active Line/Drains/Airways    Name:   Placement date:   Placement time:   Site:   Days:   Implanted Port 12/24/17 Left   12/24/17    1227    -   129   Peripheral IV 05/01/18 Left Forearm   05/01/18    2307    Forearm   1          Intake/Output Last 24 hours No intake or output data in the 24 hours ending 05/02/18 0353  Labs/Imaging Results for orders placed or performed during the hospital encounter of 05/01/18 (from the past 48 hour(s))  CBC with Differential     Status: Abnormal   Collection Time: 05/01/18  8:22 PM  Result Value Ref Range   WBC 0.8 (LL) 4.0 - 10.5 K/uL    Comment: This critical result has verified and been called to [facility] by Duncan Dull on 03 27 2020 at 2038, and has been read back.    RBC 4.16 3.87 - 5.11 MIL/uL   Hemoglobin 9.7 (L) 12.0 - 15.0 g/dL   HCT 33.7 (L) 36.0 - 46.0 %   MCV 81.0 80.0 - 100.0 fL  MCH 23.3 (L) 26.0 - 34.0 pg   MCHC 28.8 (L) 30.0 - 36.0 g/dL   RDW 15.8 (H) 11.5 - 15.5 %   Platelets 55 (L) 150 - 400 K/uL    Comment: Immature Platelet Fraction may be clinically indicated, consider ordering this additional test [id number]    nRBC 0.0 0.0 - 0.2 %   Neutrophils Relative % 30 %   Neutro Abs 0.2 (L) 1.7 - 7.7 K/uL   Lymphocytes Relative 30 %   Lymphs Abs 0.2 (L) 0.7 - 4.0 K/uL   Monocytes Relative 39 %   Monocytes Absolute 0.3 0.1 - 1.0 K/uL   Eosinophils Relative 0 %   Eosinophils Absolute 0.0 0.0 - 0.5 K/uL   Basophils Relative 0 %   Basophils Absolute 0.0 0.0 - 0.1 K/uL   Immature Granulocytes 1 %   Abs Immature Granulocytes 0.01 0.00 - 0.07 K/uL    Comment: Performed at [facility], [address], [place],  [place] [address]  Comprehensive metabolic panel     Status: Abnormal   Collection Time: 05/01/18  8:22 PM  Result Value Ref Range   Sodium 136 135 - 145 mmol/L   Potassium 3.1 (L) 3.5 - 5.1 mmol/L   Chloride 100 98 - 111 mmol/L   CO2 24 22 - 32 mmol/L   Glucose, Bld 147 (H) 70 - 99 mg/dL   BUN 14 8 - 23 mg/dL   Creatinine, Ser 0.60 0.44 - 1.00 mg/dL   Calcium 8.4 (L) 8.9 - 10.3 mg/dL   Total Protein 7.4 6.5 - 8.1 g/dL   Albumin 2.7 (L) 3.5 - 5.0 g/dL   AST 29 15 - 41 U/L   ALT 17 0 - 44 U/L   Alkaline Phosphatase 67 38 - 126 U/L   Total Bilirubin 0.8 0.3 - 1.2 mg/dL   GFR calc non Af Amer >60 >60 mL/min   GFR calc Af Amer >60 >60 mL/min   Anion gap 12 5 - 15    Comment: Performed at [facility], [address], [place], [place] [address]   Ct Chest Wo Contrast  Result Date: 05/01/2018 CLINICAL DATA:  Dyspnea, chronic, neg or nondiagnostic xray. Lung cancer, last chemotherapy last week. EXAM: CT CHEST WITHOUT CONTRAST TECHNIQUE: Multidetector CT imaging of the chest was performed following the standard protocol without IV contrast. COMPARISON:  Chest radiograph earlier this day.  PET-CT 03/12/2018 FINDINGS: Cardiovascular: Right chest port with tip in the lower SVC. Atherosclerosis of the thoracic aorta. Vascular structures not well assessed in the absence of IV contrast. Cardiomegaly. Trace pericardial effusion. Mediastinum/Nodes: Left suprahilar mass contiguous with the mediastinum. This appears subjectively larger than on prior exam measuring 5.6 x 4.0 cm, previously 4.5 x 3.9 cm. This is contiguous with left AP window and periaortic adenopathy, extent not well-defined in the absence of IV contrast. The esophagus is decompressed. Lungs/Pleura: Breathing motion artifact limits assessment. Emphysema. Left suprahilar mass contiguous with the mediastinum, subject ovary larger measuring 5.6 x 4.0 cm, previously 4.5 x 3.9 cm. Nodule anteriorly measuring 1.2 x 1.0 cm is unchanged. There is progressive  bronchial filling in the left lung with bronchial filling/mucous plugging and associated patchy and ground-glass opacities in the left upper and lower lobes. Subpleural nodule in the anterior right upper lobe measures 4 mm, image 70 series 4, unchanged allowing for differences in technique. Probable dependent atelectasis in the right lung, partially obscured by motion. Minimal patchy opacity in the right upper lobe, image 63 series 4. Small  left pleural effusion is new. Subpleural blebs in the left lower lobe as before. Left lower lobe calcified granuloma. Upper Abdomen: Motion limited.  No gross acute finding. Musculoskeletal: Lucency in the left eleventh rib corresponding to hypermetabolism on prior PET consistent with bony metastasis. New lytic lesion involving the right lateral fourth rib. IMPRESSION: 1. Progressive bronchial thickening in the left upper and lower lobes with bronchial filling, likely infectious or inflammatory rather than neoplastic. Associated patchy and ground-glass opacities suspicious for postobstructive pneumonia. 2. Minimal patchy opacity in the right upper lobe suspicious for pneumonia. Dependent right lower lobe opacity may be pneumonia or hypoventilatory change. 3. Left suprahilar mass is subjectively slightly larger from March 12, 2018 PET-CT. 4. New bone metastasis in the right fourth rib. 5. Aortic Atherosclerosis (ICD10-I70.0) and Emphysema (ICD10-J43.9). Electronically Signed   By: Keith Rake M.D.   On: 05/01/2018 22:15   Dg Chest Portable 1 View  Result Date: 05/01/2018 CLINICAL DATA:  Chest x-ray today, pneumonia, lung cancer EXAM: PORTABLE CHEST 1 VIEW COMPARISON:  12/17/2017 FINDINGS: Mild cardiomegaly. A left hilar mass is present although better appreciated by prior CT. There is diffuse bilateral interstitial pulmonary opacity and a probable small left pleural effusion. Right chest port catheter. IMPRESSION: Mild cardiomegaly. A left hilar mass is present although  better appreciated by prior CT. There is diffuse bilateral interstitial pulmonary opacity and a probable small left pleural effusion. Findings are concerning for infection and/or edema. CT may be helpful to evaluate for acute airspace disease given the presence of malignancy. Electronically Signed   By: Eddie Candle M.D.   On: 05/01/2018 20:42    Pending Labs Unresulted Labs (From admission, onward)    Start     Ordered   05/09/18 0500  Creatinine, serum  (enoxaparin (LOVENOX)    CrCl >/= 30 ml/min)  Weekly,   R    Comments:  while on enoxaparin therapy    05/02/18 0241   05/02/18 7654  Basic metabolic panel  Daily,   R     05/02/18 0241   05/02/18 0500  Magnesium  Daily,   R     05/02/18 0241   05/02/18 0500  CBC WITH DIFFERENTIAL  Daily,   R     05/02/18 0241   05/02/18 0240  Influenza panel by PCR (type A & B)  (Influenza PCR Panel)  ONCE - STAT,   R     05/02/18 0241   05/02/18 0240  Legionella Pneumophila Serogp 1 Ur Ag  Once,   R     05/02/18 0241   05/02/18 0239  Culture, sputum-assessment  ONCE - STAT,   R     05/02/18 0241   05/02/18 0239  Phosphorus  Once,   R     05/02/18 0241   05/02/18 0235  Culture, blood (routine x 2) Call MD if unable to obtain prior to antibiotics being given  BLOOD CULTURE X 2,   R    Comments:  If blood cultures drawn in Emergency Department - Do not draw and cancel order    05/02/18 0241   05/02/18 0235  Gram stain  Once,   R     05/02/18 0241   05/02/18 0235  HIV antibody (Routine Screening)  Once,   R     05/02/18 0241   05/02/18 0235  Strep pneumoniae urinary antigen  Once,   R     05/02/18 0241          Vitals/Pain Today's Vitals  05/02/18 0330 05/02/18 0334 05/02/18 0339 05/02/18 0344  BP: (!) 167/60     Pulse: 73     Resp: 17     Temp:    98.1 F (36.7 C)  TempSrc:    Oral  SpO2:  90% 97%   Weight:      Height:      PainSc:        Isolation Precautions Droplet precaution  Medications Medications  0.9 %  sodium  chloride infusion ( Intravenous Restarted 05/02/18 0017)  enoxaparin (LOVENOX) injection 40 mg (40 mg Subcutaneous Given 05/02/18 0332)  ceFEPIme (MAXIPIME) 1 g in sodium chloride 0.9 % 100 mL IVPB (has no administration in time range)  ipratropium-albuterol (DUONEB) 0.5-2.5 (3) MG/3ML nebulizer solution 3 mL (3 mLs Nebulization Given 05/02/18 0325)  methylPREDNISolone sodium succinate (SOLU-MEDROL) 125 mg/2 mL injection 60 mg (60 mg Intravenous Given 05/02/18 0332)  vancomycin (VANCOCIN) IVPB 1000 mg/200 mL premix (1,000 mg Intravenous New Bag/Given 05/02/18 0337)  sodium chloride 0.9 % bolus 500 mL (0 mLs Intravenous Stopped 05/02/18 0017)    Mobility non-ambulatory     Focused Assessments    R Recommendations: See Admitting Provider Note  Report given to:   Additional Notes:

## 2018-05-02 NOTE — Progress Notes (Signed)
Pharmacy Antibiotic Note  Melinda Larsen is a 78 y.o. female admittd on 05/01/2018 with pneumonia.  Pharmacy has been consulted for vancomycin dosing.  Patient is also currently on cefepime.  Plan: change cefepime to 2g IV q24 for CrClest~ 88mL/min Loading dose:  Vancomycin 1g IV x1 dose Maintenance dose:  Vancomycin 750mg  IV q24h Goal vancomycin trough range: 15-20   mcg/mL Pharmacy will continue to monitor renal function, vancomycin troughs as clinically appropriate, cultures and patient progress.    Height: 5\' 10"  (177.8 cm) Weight: 109 lb 9.1 oz (49.7 kg) IBW/kg (Calculated) : 68.5  Temp (24hrs), Avg:98.5 F (36.9 C), Min:97.5 F (36.4 C), Max:99.9 F (37.7 C)  Recent Labs  Lab 05/01/18 2022 05/02/18 0329  WBC 0.8* 1.2*  CREATININE 0.60 0.43*    Estimated Creatinine Clearance: 46.9 mL/min (A) (by C-G formula based on SCr of 0.43 mg/dL (L)).    Allergies  Allergen Reactions  . Codeine Nausea Only and Other (See Comments)    Nervous/sweating    Antimicrobials this admission: cefepime 3/28 >>  vancomycin 3/28 >>   Dose adjustments this admission: Cefepime changed from 1g IV q8h to cefepime 2g IV q24h per renal protocol  Microbiology results: 3/28 [facility] x2:    3/28 Strep Pneumo UA: 3/28  Sputum:  3/28 MRSA PCR:    Thank you for allowing pharmacy to be a part of this patient's care.  Despina Pole 05/02/2018 9:16 AM

## 2018-05-02 NOTE — ED Notes (Signed)
Lab at bedside for blood cultures,

## 2018-05-02 NOTE — ED Notes (Signed)
Pt updated on plan of care,  

## 2018-05-02 NOTE — ED Notes (Signed)
Date and time results received: 05/02/18 04:35  (use smartphrase ".now" to insert current time)  Test: wbc 1.2 Critical Value: wbc 1.2  Name of Provider Notified: Dr Sherral Hammers  Orders Received? Or Actions Taken?: none

## 2018-05-02 NOTE — H&P (Addendum)
[facility] History and Physical  Melinda Larsen OFB:[id number] DOB: 1940/07/08 DOA: 05/01/2018  Referring physician:  PCP: Redmond School, MD   Chief Complaint: Postobstructive pneumonia  HPI: Melinda Larsen is a 78 y.o. BF PMHx  HTN/HLD, Prediabetes, tobacco abuse and NSCLC. Pt was dx in mid Oct. after being seen in ED for  intermittent cough x2 months. At that time, Chest CT revealed large suprahilar mass w/ scattered nodules throughout both lungs. Biopsy 11/13 + for adeno. 11/25: Chemo begun. 1/24 (last dose of chemotherapy 3/17): New small brain nodule suspicious for metastatic disease s/p 1 fraction tx. 2/6: New bone mets and probable muscle metastases seen. 2/18: RD asked to see pt d/t anorexia.     Pt has lost a troubling amount of weight since she was last seen. She is 118.8 lbs today, which is a loss of 19 lbs in just 3 weeks (13.8% bw). That said, her last weight of 137.8 lbs was an outlier. Excluding that weight, she had been 127-131 x 3 months and 127-137 lbs since November.     Review of Systems:  Constitutional:  Positive weight loss, night sweats, Fevers, chills, fatigue.  HEENT:  No headaches, Difficulty swallowing,Tooth/dental problems,Sore throat,  No sneezing, itching, ear ache, nasal congestion, post nasal drip,  Cardio-vascular:  Positivechest pain, negative orthopnea, PND, swelling in lower extremities, anasarca, dizziness, palpitations  GI:  No heartburn, indigestion, abdominal pain, nausea, vomiting, diarrhea, change in bowel habits, loss of appetite  Resp:  Positive shortness of breath with exertion or at rest.Positive excess mucus, Positive productive cough, No non-productive cough, No coughing up of blood.Positive change in color of mucus.No wheezing.No chest wall deformity  Skin:  no rash or lesions.  GU:  no dysuria, change in color of urine, no urgency or frequency. No flank pain.  Musculoskeletal:  No joint pain or swelling. No decreased  range of motion. No back pain.  Psych:  No change in mood or affect. No depression or anxiety. No memory loss.   Past Medical History:  Diagnosis Date   Anemia    Borderline diabetes    Dyspnea    Hyperlipidemia    Hypertension    Lung cancer ([place]) 12/23/2017   Metastasis to bone ([place]) 03/24/2018   Pneumonia    Pre-diabetes    Past Surgical History:  Procedure Laterality Date   ABDOMINAL HYSTERECTOMY     APPENDECTOMY     COLONOSCOPY N/A 07/16/2017   Procedure: COLONOSCOPY;  Surgeon: Danie Binder, MD;  Location[facility];  Service: Endoscopy;  Laterality: N/A;  10:30   IR IMAGING GUIDED PORT INSERTION  12/24/2017   POLYPECTOMY  07/16/2017   Procedure: POLYPECTOMY;  Surgeon: Danie Binder, MD;  Location: [facility];  Service: Endoscopy;;  cecal x2;hepatic flexurex5; splenic x1;descending x2   VIDEO BRONCHOSCOPY WITH ENDOBRONCHIAL ULTRASOUND N/A 12/17/2017   Procedure: VIDEO BRONCHOSCOPY WITH ENDOBRONCHIAL ULTRASOUND;  Surgeon: Melrose Nakayama, MD;  Location: [facility];  Service: Thoracic;  Laterality: N/A;   Social History:  reports that she quit smoking about 5 years ago. Her smoking use included cigarettes. She has a 40.00 pack-year smoking history. She has never used smokeless tobacco. She reports that she does not drink alcohol or use drugs.  Allergies  Allergen Reactions   Codeine Nausea Only and Other (See Comments)    Nervous/sweating    Family History  Problem Relation Age of Onset   Hypertension Mother    Hypertension Father    Colon cancer  Neg Hx    Colon polyps Neg Hx       Prior to Admission medications   Medication Sig Start Date End Date Taking? Authorizing Provider  Accu-Chek FastClix Lancets MISC  04/17/18   [provider]  ACCU-CHEK GUIDE test strip  04/17/18   [provider]  albuterol (PROVENTIL HFA;VENTOLIN HFA) 108 (90 Base) MCG/ACT inhaler Inhale 2 puffs into the lungs every 4 (four) hours as  needed for wheezing or shortness of breath (or coughing). 11/16/17   Francine Graven, DO  amLODipine (NORVASC) 5 MG tablet Take 5 mg by mouth every evening.     [provider]  aspirin EC 81 MG tablet Take 81 mg by mouth every evening.     [provider]  Blood Glucose Monitoring Suppl (ACCU-CHEK GUIDE) w/Device KIT  04/17/18   [provider]  dexamethasone (DECADRON) 4 MG tablet Take 1 tab two times a day the day before Alimta chemo, then take 2 tabs once a day for 3 days starting the day after chemo. 03/30/18   Higgs, Mathis Dad, MD  folic acid (FOLVITE) 1 MG tablet Take 1 tablet (1 mg total) by mouth daily. Start 5-7 days before Alimta chemotherapy. Continue until 21 days after Alimta completed. 12/23/17   Higgs, Mathis Dad, MD  HYDROcodone-acetaminophen (NORCO/VICODIN) 5-325 MG tablet Take 1 tablet by mouth every 4 (four) hours as needed for moderate pain. 1 or 2 tabs PO q4 hours prn pain 03/31/18   Higgs, Mathis Dad, MD  lisinopril (PRINIVIL,ZESTRIL) 5 MG tablet Take 5 mg by mouth every evening.     [provider]  mirtazapine (REMERON) 15 MG tablet Take 1 tablet (15 mg total) by mouth at bedtime. 04/21/18   Derek Jack, MD  simvastatin (ZOCOR) 40 MG tablet Take 40 mg by mouth every evening.     [provider]     Consultants:  None    Procedures/Significant Events:  3/27 CT chest W. Wo contrast:-Progressive bronchial thickening in the left upper and lower lobes with bronchial filling, likely infectious or inflammatory rather than neoplastic. Associated patchy and ground-glass opacities suspicious for postobstructive pneumonia. -Minimal patchy opacity in the right upper lobe suspicious for pneumonia.  -Dependent right lower lobe opacity may be pneumonia or hypoventilatory change. -Left suprahilar mass is subjectively slightly larger from March 12, 2018 PET-CT. - New bone metastasis in the right fourth rib.   I have personally reviewed and  interpreted all radiology studies and my findings are as above.   VENTILATOR SETTINGS:    Cultures 3/28 influenza A/B negative  3/28 blood pending   Antimicrobials: Anti-infectives (From admission, onward)   Start     Ordered Stop   05/02/18 0330  ceFEPIme (MAXIPIME) 1 g in sodium chloride 0.9 % 100 mL IVPB     05/02/18 0241 05/10/18 0329   05/02/18 0315  vancomycin (VANCOCIN) IVPB 1000 mg/200 mL premix     05/02/18 0300         Devices    LINES / TUBES:      Continuous Infusions:  sodium chloride 75 mL/hr at 05/02/18 0017    Physical Exam: Vitals:   05/01/18 2200 05/01/18 2230 05/01/18 2300 05/01/18 2306  BP: (!) 150/68 (!) 144/54 (!) 162/76   Pulse: 74 72 77   Resp: (!) 28 17 (!) 21   Temp:    99.9 F (37.7 C)  TempSrc:    Oral  SpO2: 93% 97% 99%   Weight:      Height:  Wt Readings from Last 3 Encounters:  05/01/18 53.8 kg  04/21/18 53.9 kg  03/31/18 62.4 kg    General: Positive acute on chronic respiratory distress, cachectic Eyes: negative scleral hemorrhage, negative anisocoria, negative icterus ENT: Negative Runny nose, negative gingival bleeding, Neck:  Negative scars, masses, torticollis, lymphadenopathy, JVD Lungs: Positive diffuse inspiratory and expiratory wheezing, loss of breath sounds in RLL/LLL/LUL tachypneic Cardiovascular: Regular rate and rhythm without murmur gallop or rub normal S1 and S2 Abdomen: negative abdominal pain, nondistended, positive soft, bowel sounds, no rebound, no ascites, no appreciable mass Extremities: No significant cyanosis, clubbing, or edema bilateral lower extremities Skin: Negative rashes, lesions, ulcers, dry flaking Psychiatric:  Negative depression, negative anxiety, negative fatigue, negative mania  Central nervous system:  Cranial nerves II through XII intact, tongue/uvula midline, all extremities muscle strength 5/5, sensation intact throughout, negative dysarthria, negative expressive aphasia,  negative receptive aphasia.        Labs on Admission:  Basic Metabolic Panel: Recent Labs  Lab 05/01/18 2022  NA 136  K 3.1*  CL 100  CO2 24  GLUCOSE 147*  BUN 14  CREATININE 0.60  CALCIUM 8.4*   Liver Function Tests: Recent Labs  Lab 05/01/18 2022  AST 29  ALT 17  ALKPHOS 67  BILITOT 0.8  PROT 7.4  ALBUMIN 2.7*   No results for input(s): LIPASE, AMYLASE in the last 168 hours. No results for input(s): AMMONIA in the last 168 hours. CBC: Recent Labs  Lab 05/01/18 2022  WBC 0.8*  NEUTROABS 0.2*  HGB 9.7*  HCT 33.7*  MCV 81.0  PLT 55*   Cardiac Enzymes: No results for input(s): CKTOTAL, CKMB, CKMBINDEX, TROPONINI in the last 168 hours.  BNP (last 3 results) No results for input(s): BNP in the last 8760 hours.  ProBNP (last 3 results) No results for input(s): PROBNP in the last 8760 hours.  CBG: No results for input(s): GLUCAP in the last 168 hours.  Radiological Exams on Admission: Ct Chest Wo Contrast  Result Date: 05/01/2018 CLINICAL DATA:  Dyspnea, chronic, neg or nondiagnostic xray. Lung cancer, last chemotherapy last week. EXAM: CT CHEST WITHOUT CONTRAST TECHNIQUE: Multidetector CT imaging of the chest was performed following the standard protocol without IV contrast. COMPARISON:  Chest radiograph earlier this day.  PET-CT 03/12/2018 FINDINGS: Cardiovascular: Right chest port with tip in the lower SVC. Atherosclerosis of the thoracic aorta. Vascular structures not well assessed in the absence of IV contrast. Cardiomegaly. Trace pericardial effusion. Mediastinum/Nodes: Left suprahilar mass contiguous with the mediastinum. This appears subjectively larger than on prior exam measuring 5.6 x 4.0 cm, previously 4.5 x 3.9 cm. This is contiguous with left AP window and periaortic adenopathy, extent not well-defined in the absence of IV contrast. The esophagus is decompressed. Lungs/Pleura: Breathing motion artifact limits assessment. Emphysema. Left suprahilar mass  contiguous with the mediastinum, subject ovary larger measuring 5.6 x 4.0 cm, previously 4.5 x 3.9 cm. Nodule anteriorly measuring 1.2 x 1.0 cm is unchanged. There is progressive bronchial filling in the left lung with bronchial filling/mucous plugging and associated patchy and ground-glass opacities in the left upper and lower lobes. Subpleural nodule in the anterior right upper lobe measures 4 mm, image 70 series 4, unchanged allowing for differences in technique. Probable dependent atelectasis in the right lung, partially obscured by motion. Minimal patchy opacity in the right upper lobe, image 63 series 4. Small left pleural effusion is new. Subpleural blebs in the left lower lobe as before. Left lower lobe calcified granuloma. Upper  Abdomen: Motion limited.  No gross acute finding. Musculoskeletal: Lucency in the left eleventh rib corresponding to hypermetabolism on prior PET consistent with bony metastasis. New lytic lesion involving the right lateral fourth rib. IMPRESSION: 1. Progressive bronchial thickening in the left upper and lower lobes with bronchial filling, likely infectious or inflammatory rather than neoplastic. Associated patchy and ground-glass opacities suspicious for postobstructive pneumonia. 2. Minimal patchy opacity in the right upper lobe suspicious for pneumonia. Dependent right lower lobe opacity may be pneumonia or hypoventilatory change. 3. Left suprahilar mass is subjectively slightly larger from March 12, 2018 PET-CT. 4. New bone metastasis in the right fourth rib. 5. Aortic Atherosclerosis (ICD10-I70.0) and Emphysema (ICD10-J43.9). Electronically Signed   By: Keith Rake M.D.   On: 05/01/2018 22:15   Dg Chest Portable 1 View  Result Date: 05/01/2018 CLINICAL DATA:  Chest x-ray today, pneumonia, lung cancer EXAM: PORTABLE CHEST 1 VIEW COMPARISON:  12/17/2017 FINDINGS: Mild cardiomegaly. A left hilar mass is present although better appreciated by prior CT. There is diffuse  bilateral interstitial pulmonary opacity and a probable small left pleural effusion. Right chest port catheter. IMPRESSION: Mild cardiomegaly. A left hilar mass is present although better appreciated by prior CT. There is diffuse bilateral interstitial pulmonary opacity and a probable small left pleural effusion. Findings are concerning for infection and/or edema. CT may be helpful to evaluate for acute airspace disease given the presence of malignancy. Electronically Signed   By: Eddie Candle M.D.   On: 05/01/2018 20:42    EKG: independently reviewed.  RBBB, LAFB   Assessment/Plan Principal Problem:   Post-obstructive pneumonia due to foreign body aspiration Active Problems:   Brain metastasis ([place])   Metastasis to bone ([facility])   Tobacco abuse   Adenocarcinoma ([facility]) metastatic of lung   Non-small cell lung cancer (NSCLC) ([facility])  Postobstructive pneumonia/neutropenia - Continue current antibiotics x14 days given patient's immunocompromised state - Consult oncology and inform them patient is hospitalized. - ANC= 240 if does not improve with antibiotics consider Neupogen dose.  Held off at this point as patient has not developed fever, therefore not neutropenic fever. -Neutropenic isolation  Metastatic adenocarcinoma of the lung - Per Dr. Derek Jack oncology note from 3/17 : PET scan on 11/23/2017 shows hypermetabolic left hilar mass encroaching on AP window, nodularity in the anterior left upper lobe and superior left upper lobe, hypermetabolic metastatic pulmonary nodule within the right upper lobe. - 4 cycles of carboplatin, pemetrexed and pembrolizumab from 12/29/2017 through 03/03/2018 -PET/CT scan on 03/12/2018 shows dominant left suprahilar mass with no change in size or metabolic him, bilateral pulmonary nodules decreased in size, new bilateral small hypermetabolic axial lymph nodes, new metastatic bone lesions in the right iliac bone, left femur and posterior left 11th rib,  probable muscle metastasis in the left gluteus muscle and left diaphragm adjacent stomach. - Patient was continued on same chemotherapy with cycle 5 on 03/31/2018.  Dr. Walden Field reportedly talk to the insurance which would not approve change in therapy. -04/21/2018 completed cycle 6 chemotherapy -From oncology notes does not appear to be responding to chemotherapy.  Would consult oncology in the [facility] and let them know patient admitted for postobstructive pneumonia and neutropenia  Brain metastasis/bone metastasis -see metastatic adenocarcinoma of lung  Tobacco abuse - Currently not an issue  Thrombocytopenia - Patient's platelets= 55 given patient's metastatic cancer and extremely high risk for developing DVT/PE started patient on Lovenox for VTE prophylaxis.  Will need to monitor closely for bleeding or drop in  patient's platelet count   Goals of care - 3/27 palliative care consult: Consult for change of code to DNR vs hospice.  Spoke at length with patient concerning her metastatic lung cancer and the likelihood she would not survive CODE STATUS.  Given current state of affairs having patient on ventilator for long period of time when unlikely to survive intubation, needs to be discussed again with patient and family.      Code Status: Full (DVT Prophylaxis: Lovenox Family Communication: None Disposition Plan: TBD   Data Reviewed: Care during the described time interval was provided by me .  I have reviewed this patient's available data, including medical history, events of note, physical examination, and all test results as part of my evaluation.   Time spent: 60 min  [place], [facility] Pager [phone]

## 2018-05-02 NOTE — Progress Notes (Signed)
Palliative Medicine consult order noted. PMT provider will return to [facility] on Monday, May 04, 2018. If the patient remains hospitalized, the consult will be evaluated at that time. If recommendations are needed in the interim, please call our office at [phone].   Juel Burrow, DNP, AGNP-C Palliative Medicine Team Team Phone # [phone]  Pager # [phone]

## 2018-05-03 DIAGNOSIS — J189 Pneumonia, unspecified organism: Secondary | ICD-10-CM

## 2018-05-03 DIAGNOSIS — L8990 Pressure ulcer of unspecified site, unspecified stage: Secondary | ICD-10-CM

## 2018-05-03 DIAGNOSIS — T17908S Unspecified foreign body in respiratory tract, part unspecified causing other injury, sequela: Secondary | ICD-10-CM

## 2018-05-03 LAB — CBC WITH DIFFERENTIAL/PLATELET
Abs Immature Granulocytes: 0.8 10*3/uL — ABNORMAL HIGH (ref 0.00–0.07)
BASOS PCT: 0 %
Basophils Absolute: 0 10*3/uL (ref 0.0–0.1)
Eosinophils Absolute: 0 10*3/uL (ref 0.0–0.5)
Eosinophils Relative: 0 %
HCT: 27.3 % — ABNORMAL LOW (ref 36.0–46.0)
Hemoglobin: 8.1 g/dL — ABNORMAL LOW (ref 12.0–15.0)
Immature Granulocytes: 16 %
Lymphocytes Relative: 15 %
Lymphs Abs: 0.8 10*3/uL (ref 0.7–4.0)
MCH: 23.6 pg — AB (ref 26.0–34.0)
MCHC: 29.7 g/dL — ABNORMAL LOW (ref 30.0–36.0)
MCV: 79.6 fL — ABNORMAL LOW (ref 80.0–100.0)
Monocytes Absolute: 0.8 10*3/uL (ref 0.1–1.0)
Monocytes Relative: 16 %
Neutro Abs: 2.8 10*3/uL (ref 1.7–7.7)
Neutrophils Relative %: 53 %
Platelets: 37 10*3/uL — ABNORMAL LOW (ref 150–400)
RBC: 3.43 MIL/uL — AB (ref 3.87–5.11)
RDW: 15.7 % — ABNORMAL HIGH (ref 11.5–15.5)
WBC: 5.2 10*3/uL (ref 4.0–10.5)
nRBC: 1.2 % — ABNORMAL HIGH (ref 0.0–0.2)

## 2018-05-03 LAB — BASIC METABOLIC PANEL
Anion gap: 7 (ref 5–15)
BUN: 14 mg/dL (ref 8–23)
CO2: 22 mmol/L (ref 22–32)
Calcium: 8.1 mg/dL — ABNORMAL LOW (ref 8.9–10.3)
Chloride: 108 mmol/L (ref 98–111)
Creatinine, Ser: 0.44 mg/dL (ref 0.44–1.00)
GFR calc Af Amer: >60 mL/min (ref >60)
Glucose, Bld: 118 mg/dL — ABNORMAL HIGH (ref 70–99)
Potassium: 3.9 mmol/L (ref 3.5–5.1)
Sodium: 137 mmol/L (ref 135–145)

## 2018-05-03 LAB — HIV ANTIBODY (ROUTINE TESTING W REFLEX): HIV Screen 4th Generation wRfx: NONREACTIVE

## 2018-05-03 LAB — MAGNESIUM: MAGNESIUM: 1.6 mg/dL — AB (ref 1.7–2.4)

## 2018-05-03 MED ORDER — MAGNESIUM SULFATE 2 GM/50ML IV SOLN
2.0000 g | Freq: Once | INTRAVENOUS | Status: AC
  Administered 2018-05-03: 2 g via INTRAVENOUS
  Filled 2018-05-03: qty 50

## 2018-05-03 MED ORDER — IPRATROPIUM-ALBUTEROL 20-100 MCG/ACT IN AERS: 1.0000 | INHALATION_SPRAY | Freq: Four times a day (QID) | RESPIRATORY_TRACT | 2 refills | Status: DC | PRN

## 2018-05-03 MED ORDER — BENZONATATE 100 MG PO CAPS: 100.0000 mg | ORAL_CAPSULE | Freq: Four times a day (QID) | ORAL | 0 refills | Status: DC | PRN

## 2018-05-03 MED ORDER — ENSURE ENLIVE PO LIQD: 237.0000 mL | Freq: Three times a day (TID) | ORAL | 12 refills | Status: DC

## 2018-05-03 MED ORDER — AMOXICILLIN-POT CLAVULANATE 875-125 MG PO TABS: 1.0000 | ORAL_TABLET | Freq: Two times a day (BID) | ORAL | 0 refills | Status: DC

## 2018-05-03 NOTE — Progress Notes (Signed)
Nsg Discharge Note  Admit Date:  05/01/2018 Discharge date: 05/03/2018   Tammy Sours to be D/C'd Home per MD order.  AVS completed.  Copy for chart, and copy for patient signed, and dated. Patient/caregiver able to verbalize understanding.  Discharge Medication: Allergies as of 05/03/2018      Reactions   Codeine Nausea Only, Other (See Comments)   Nervous/sweating      Medication List    TAKE these medications   Accu-Chek FastClix Lancets Misc   Accu-Chek Guide test strip Generic drug:  glucose blood   Accu-Chek Guide w/Device Kit   albuterol 108 (90 Base) MCG/ACT inhaler Commonly known as:  PROVENTIL HFA;VENTOLIN HFA Inhale 2 puffs into the lungs every 4 (four) hours as needed for wheezing or shortness of breath (or coughing).   amLODipine 5 MG tablet Commonly known as:  NORVASC Take 5 mg by mouth every evening.   amoxicillin-clavulanate 875-125 MG tablet Commonly known as:  Augmentin Take 1 tablet by mouth 2 (two) times daily for 7 days.   aspirin EC 81 MG tablet Take 81 mg by mouth every evening.   benzonatate 100 MG capsule Commonly known as:  Tessalon Perles Take 1 capsule (100 mg total) by mouth every 6 (six) hours as needed for cough.   dexamethasone 4 MG tablet Commonly known as:  DECADRON Take 1 tab two times a day the day before Alimta chemo, then take 2 tabs once a day for 3 days starting the day after chemo.   feeding supplement (ENSURE ENLIVE) Liqd Take 237 mLs by mouth 3 (three) times daily between meals.   folic acid 1 MG tablet Commonly known as:  FOLVITE Take 1 tablet (1 mg total) by mouth daily. Start 5-7 days before Alimta chemotherapy. Continue until 21 days after Alimta completed.   HYDROcodone-acetaminophen 5-325 MG tablet Commonly known as:  NORCO/VICODIN Take 1 tablet by mouth every 4 (four) hours as needed for moderate pain. 1 or 2 tabs PO q4 hours prn pain   Ipratropium-Albuterol 20-100 MCG/ACT Aers respimat Commonly known as:   COMBIVENT Inhale 1 puff into the lungs every 6 (six) hours as needed for up to 30 days for wheezing or shortness of breath.   lisinopril 5 MG tablet Commonly known as:  PRINIVIL,ZESTRIL Take 5 mg by mouth every evening.   mirtazapine 15 MG tablet Commonly known as:  Remeron Take 1 tablet (15 mg total) by mouth at bedtime.   simvastatin 40 MG tablet Commonly known as:  ZOCOR Take 40 mg by mouth every evening.       Discharge Assessment: Vitals:   05/02/18 2202 05/03/18 0627  BP: (!) 148/59 (!) 156/68  Pulse: 73 64  Resp: 15 17  Temp: 97.7 F (36.5 C) 98.3 F (36.8 C)  SpO2: 96% 90%   Skin clean, dry and intact without evidence of skin break down, no evidence of skin tears noted. IV catheter discontinued intact. Site without signs and symptoms of complications - no redness or edema noted at insertion site, patient denies c/o pain - only slight tenderness at site.  Dressing with slight pressure applied.  D/c Instructions-Education: Discharge instructions given to patient/family with verbalized understanding. D/c education completed with patient/family including follow up instructions, medication list, d/c activities limitations if indicated, with other d/c instructions as indicated by MD - patient able to verbalize understanding, all questions fully answered. Patient instructed to return to ED, call 911, or call MD for any changes in condition.  Patient escorted via  WC, and D/C home via private auto.  Loa Socks, RN 05/03/2018 10:46 AM

## 2018-05-03 NOTE — Progress Notes (Signed)
SATURATION QUALIFICATIONS: (This note is used to comply with regulatory documentation for home oxygen)  Patient Saturations on Room Air at Rest = 95%  Patient Saturations on Room Air while Ambulating = 93%

## 2018-05-03 NOTE — Discharge Summary (Signed)
Physician Discharge Summary  Melinda Larsen IFO:[id number] DOB: 01-17-1941 DOA: 05/01/2018  PCP: Redmond School, MD  Admit date: 05/01/2018  Discharge date: 05/03/2018  Admitted From:Home  Disposition:  Home  Recommendations for Outpatient Follow-up:  1. Follow up with PCP in 1-2 weeks 2. Please obtain BMP/CBC in one week 3. Continue on oral Augmentin as prescribed for 7 more days as well as Best boy as needed for cough  Home Health: None  Equipment/Devices: None  Discharge Condition: Stable  CODE STATUS: Full  Diet recommendation: Heart Healthy  Brief/Interim Summary: Per HPI: Patient brought in by EMS at the request of the family. Apparently primary care doctor there was some concerns about some shortness of breath so a truck went to the house to do a chest x-ray which raise concerns for pneumonia. Patient sent in. Patient without any specific complaints she does have known metastatic lung cancer with a known left hilar mass. She is undergoing chemotherapy. Last dose of chemotherapy was March 17. She is followed by hematology oncology upstairs. Patient without any specific complaints. Patient is a full code. Patient denies any fevers. Denies anybody being sick at home. No known coronavirus exposure.  Patient was admitted with postobstructive/healthcare associated pneumonia in the setting of metastatic adenocarcinoma of the lung.  She has been started on vancomycin and cefepime and appears to have no significant respiratory distress.  She is noted to be neutropenic and follows with Dr. Delton Coombes of oncology.  She was initially noted to be hypomagnesemic and hypokalemic, but has had replacement of these electrolytes.  She was initially treated with vancomycin and cefepime and vancomycin was discontinued as she was MRSA negative.  Cefepime has been converted over to Augmentin at this point in time and clinically she is much improved with no further shortness of breath,  coughing, wheezing and has no O2 requirements.  She is overall stable for discharge and will need to continue on Augmentin for 7 days to complete course of treatment.  She was noted to be neutropenic on admission and received 1 dose of Neupogen on 3/28 with improvement of her white cell count to within normal limits this morning.  She will follow-up with Dr. Delton Coombes as scheduled in the near future.  No other acute events noted during this brief admission.  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[facility])   Tobacco abuse   Adenocarcinoma ([place]) metastatic of lung   Non-small cell lung cancer (NSCLC) ([facility])   Postobstructive pneumonia   Pressure injury of skin    Discharge Instructions  Discharge Instructions    Diet - low sodium heart healthy   Complete by:  As directed    Increase activity slowly   Complete by:  As directed      Allergies as of 05/03/2018      Reactions   Codeine Nausea Only, Other (See Comments)   Nervous/sweating      Medication List    TAKE these medications   Accu-Chek FastClix Lancets Misc   Accu-Chek Guide test strip Generic drug:  glucose blood   Accu-Chek Guide w/Device Kit   albuterol 108 (90 Base) MCG/ACT inhaler Commonly known as:  PROVENTIL HFA;VENTOLIN HFA Inhale 2 puffs into the lungs every 4 (four) hours as needed for wheezing or shortness of breath (or coughing).   amLODipine 5 MG tablet Commonly known as:  NORVASC Take 5 mg by mouth  every evening.   amoxicillin-clavulanate 875-125 MG tablet Commonly known as:  Augmentin Take 1 tablet by mouth 2 (two) times daily for 7 days.   aspirin EC 81 MG tablet Take 81 mg by mouth every evening.   benzonatate 100 MG capsule Commonly known as:  Tessalon Perles Take 1 capsule (100 mg total) by mouth every 6 (six) hours as needed for cough.   dexamethasone 4 MG  tablet Commonly known as:  DECADRON Take 1 tab two times a day the day before Alimta chemo, then take 2 tabs once a day for 3 days starting the day after chemo.   feeding supplement (ENSURE ENLIVE) Liqd Take 237 mLs by mouth 3 (three) times daily between meals.   folic acid 1 MG tablet Commonly known as:  FOLVITE Take 1 tablet (1 mg total) by mouth daily. Start 5-7 days before Alimta chemotherapy. Continue until 21 days after Alimta completed.   HYDROcodone-acetaminophen 5-325 MG tablet Commonly known as:  NORCO/VICODIN Take 1 tablet by mouth every 4 (four) hours as needed for moderate pain. 1 or 2 tabs PO q4 hours prn pain   Ipratropium-Albuterol 20-100 MCG/ACT Aers respimat Commonly known as:  COMBIVENT Inhale 1 puff into the lungs every 6 (six) hours as needed for up to 30 days for wheezing or shortness of breath.   lisinopril 5 MG tablet Commonly known as:  PRINIVIL,ZESTRIL Take 5 mg by mouth every evening.   mirtazapine 15 MG tablet Commonly known as:  Remeron Take 1 tablet (15 mg total) by mouth at bedtime.   simvastatin 40 MG tablet Commonly known as:  ZOCOR Take 40 mg by mouth every evening.      Follow-up Information    Redmond School, MD Follow up in 1 week(s).   Specialty:  Internal Medicine Contact information: [address] [place] [address] [phone]          Allergies  Allergen Reactions  . Codeine Nausea Only and Other (See Comments)    Nervous/sweating    Consultations:  Discussed with Dr. Delton Coombes on phone   Procedures/Studies: Ct Chest Wo Contrast  Result Date: 05/01/2018 CLINICAL DATA:  Dyspnea, chronic, neg or nondiagnostic xray. Lung cancer, last chemotherapy last week. EXAM: CT CHEST WITHOUT CONTRAST TECHNIQUE: Multidetector CT imaging of the chest was performed following the standard protocol without IV contrast. COMPARISON:  Chest radiograph earlier this day.  PET-CT 03/12/2018 FINDINGS: Cardiovascular: Right chest  port with tip in the lower SVC. Atherosclerosis of the thoracic aorta. Vascular structures not well assessed in the absence of IV contrast. Cardiomegaly. Trace pericardial effusion. Mediastinum/Nodes: Left suprahilar mass contiguous with the mediastinum. This appears subjectively larger than on prior exam measuring 5.6 x 4.0 cm, previously 4.5 x 3.9 cm. This is contiguous with left AP window and periaortic adenopathy, extent not well-defined in the absence of IV contrast. The esophagus is decompressed. Lungs/Pleura: Breathing motion artifact limits assessment. Emphysema. Left suprahilar mass contiguous with the mediastinum, subject ovary larger measuring 5.6 x 4.0 cm, previously 4.5 x 3.9 cm. Nodule anteriorly measuring 1.2 x 1.0 cm is unchanged. There is progressive bronchial filling in the left lung with bronchial filling/mucous plugging and associated patchy and ground-glass opacities in the left upper and lower lobes. Subpleural nodule in the anterior right upper lobe measures 4 mm, image 70 series 4, unchanged allowing for differences in technique. Probable dependent atelectasis in the right lung, partially obscured by motion. Minimal patchy opacity in the right upper lobe, image 63 series 4. Small left  pleural effusion is new. Subpleural blebs in the left lower lobe as before. Left lower lobe calcified granuloma. Upper Abdomen: Motion limited.  No gross acute finding. Musculoskeletal: Lucency in the left eleventh rib corresponding to hypermetabolism on prior PET consistent with bony metastasis. New lytic lesion involving the right lateral fourth rib. IMPRESSION: 1. Progressive bronchial thickening in the left upper and lower lobes with bronchial filling, likely infectious or inflammatory rather than neoplastic. Associated patchy and ground-glass opacities suspicious for postobstructive pneumonia. 2. Minimal patchy opacity in the right upper lobe suspicious for pneumonia. Dependent right lower lobe opacity may  be pneumonia or hypoventilatory change. 3. Left suprahilar mass is subjectively slightly larger from March 12, 2018 PET-CT. 4. New bone metastasis in the right fourth rib. 5. Aortic Atherosclerosis (ICD10-I70.0) and Emphysema (ICD10-J43.9). Electronically Signed   By: Keith Rake M.D.   On: 05/01/2018 22:15   Dg Chest Portable 1 View  Result Date: 05/01/2018 CLINICAL DATA:  Chest x-ray today, pneumonia, lung cancer EXAM: PORTABLE CHEST 1 VIEW COMPARISON:  12/17/2017 FINDINGS: Mild cardiomegaly. A left hilar mass is present although better appreciated by prior CT. There is diffuse bilateral interstitial pulmonary opacity and a probable small left pleural effusion. Right chest port catheter. IMPRESSION: Mild cardiomegaly. A left hilar mass is present although better appreciated by prior CT. There is diffuse bilateral interstitial pulmonary opacity and a probable small left pleural effusion. Findings are concerning for infection and/or edema. CT may be helpful to evaluate for acute airspace disease given the presence of malignancy. Electronically Signed   By: Eddie Candle M.D.   On: 05/01/2018 20:42     Discharge Exam: Vitals:   05/02/18 2202 05/03/18 0627  BP: (!) 148/59 (!) 156/68  Pulse: 73 64  Resp: 15 17  Temp: 97.7 F (36.5 C) 98.3 F (36.8 C)  SpO2: 96% 90%   Vitals:   05/02/18 1500 05/02/18 1552 05/02/18 2202 05/03/18 0627  BP:  (!) 163/72 (!) 148/59 (!) 156/68  Pulse: 63 61 73 64  Resp:  '20 15 17  '$ Temp:  98.5 F (36.9 C) 97.7 F (36.5 C) 98.3 F (36.8 C)  TempSrc:  Oral Oral Oral  SpO2: 97% 96% 96% 90%  Weight:      Height:        General: Pt is alert, awake, not in acute distress Cardiovascular: RRR, S1/S2 +, no rubs, no gallops Respiratory: CTA bilaterally, no wheezing, no rhonchi Abdominal: Soft, NT, ND, bowel sounds + Extremities: no edema, no cyanosis    The results of significant diagnostics from this hospitalization (including imaging, microbiology,  ancillary and laboratory) are listed below for reference.     Microbiology: Recent Results (from the past 240 hour(s))  Culture, blood (routine x 2) Call MD if unable to obtain prior to antibiotics being given     Status: None (Preliminary result)   Collection Time: 05/02/18  3:28 AM  Result Value Ref Range Status   Specimen Description RIGHT ANTECUBITAL  Final   Special Requests   Final    BOTTLES DRAWN AEROBIC AND ANAEROBIC Blood Culture adequate volume   Culture   Final    NO GROWTH 1 DAY Performed at [facility], [address] [place], [place] [address]    Report Status PENDING  Incomplete  Culture, blood (routine x 2) Call MD if unable to obtain prior to antibiotics being given     Status: None (Preliminary result)   Collection Time: 05/02/18  3:34 AM  Result Value Ref  Range Status   Specimen Description BLOOD RIGHT HAND  Final   Special Requests   Final    BOTTLES DRAWN AEROBIC AND ANAEROBIC Blood Culture adequate volume   Culture   Final    NO GROWTH 1 DAY Performed at [facility], [address], [place], [place] [address]    Report Status PENDING  Incomplete  MRSA PCR Screening     Status: None   Collection Time: 05/02/18  6:26 AM  Result Value Ref Range Status   MRSA by PCR NEGATIVE NEGATIVE Final    Comment:        The GeneXpert MRSA Assay (FDA approved for NASAL specimens only), is one component of a comprehensive MRSA colonization surveillance program. It is not intended to diagnose MRSA infection nor to guide or monitor treatment for MRSA infections. Performed at [facility], [address] [place], [place] [address]      Labs: BNP (last 3 results) No results for input(s): BNP in the last 8760 hours. Basic Metabolic Panel: Recent Labs  Lab 05/01/18 2022 05/02/18 0328 05/02/18 0329 05/03/18 0423  NA 136  --  138 137  K 3.1*  --  2.9* 3.9  CL 100  --  107 108  CO2 24  --  24 22  GLUCOSE 147*  --  120* 118*  BUN 14  --  12 14  CREATININE 0.60   --  0.43* 0.44  CALCIUM 8.4*  --  7.7* 8.1*  MG  --   --  1.3* 1.6*  PHOS  --  1.2*  --   --    Liver Function Tests: Recent Labs  Lab 05/01/18 2022  AST 29  ALT 17  ALKPHOS 67  BILITOT 0.8  PROT 7.4  ALBUMIN 2.7*   No results for input(s): LIPASE, AMYLASE in the last 168 hours. No results for input(s): AMMONIA in the last 168 hours. CBC: Recent Labs  Lab 05/01/18 2022 05/02/18 0329 05/03/18 0423  WBC 0.8* 1.2* 5.2  NEUTROABS 0.2* 0.3* 2.8  HGB 9.7* 8.0* 8.1*  HCT 33.7* 27.9* 27.3*  MCV 81.0 80.4 79.6*  PLT 55* 44* 37*   Cardiac Enzymes: No results for input(s): CKTOTAL, CKMB, CKMBINDEX, TROPONINI in the last 168 hours. BNP: Invalid input(s): POCBNP CBG: No results for input(s): GLUCAP in the last 168 hours. D-Dimer No results for input(s): DDIMER in the last 72 hours. Hgb A1c No results for input(s): HGBA1C in the last 72 hours. Lipid Profile No results for input(s): CHOL, HDL, LDLCALC, TRIG, CHOLHDL, LDLDIRECT in the last 72 hours. Thyroid function studies No results for input(s): TSH, T4TOTAL, T3FREE, THYROIDAB in the last 72 hours.  Invalid input(s): FREET3 Anemia work up No results for input(s): VITAMINB12, FOLATE, FERRITIN, TIBC, IRON, RETICCTPCT in the last 72 hours. Urinalysis No results found for: COLORURINE, APPEARANCEUR, [place], [place], [place], [place], [place], [place], PROTEINUR, UROBILINOGEN, NITRITE, LEUKOCYTESUR Sepsis Labs Invalid input(s): PROCALCITONIN,  WBC,  LACTICIDVEN Microbiology Recent Results (from the past 240 hour(s))  Culture, blood (routine x 2) Call MD if unable to obtain prior to antibiotics being given     Status: None (Preliminary result)   Collection Time: 05/02/18  3:28 AM  Result Value Ref Range Status   Specimen Description RIGHT ANTECUBITAL  Final   Special Requests   Final    BOTTLES DRAWN AEROBIC AND ANAEROBIC Blood Culture adequate volume   Culture   Final    NO GROWTH 1 DAY Performed at [facility], [address]  [place], [place] [address]    Report Status PENDING  Incomplete  Culture, blood (routine x 2) Call MD if unable to obtain prior to antibiotics being given     Status: None (Preliminary result)   Collection Time: 05/02/18  3:34 AM  Result Value Ref Range Status   Specimen Description BLOOD RIGHT HAND  Final   Special Requests   Final    BOTTLES DRAWN AEROBIC AND ANAEROBIC Blood Culture adequate volume   Culture   Final    NO GROWTH 1 DAY Performed at [facility], [address] [place], [place] [address]    Report Status PENDING  Incomplete  MRSA PCR Screening     Status: None   Collection Time: 05/02/18  6:26 AM  Result Value Ref Range Status   MRSA by PCR NEGATIVE NEGATIVE Final    Comment:        The GeneXpert MRSA Assay (FDA approved for NASAL specimens only), is one component of a comprehensive MRSA colonization surveillance program. It is not intended to diagnose MRSA infection nor to guide or monitor treatment for MRSA infections. Performed at [facility], [address], [place], [place] [address]      Time coordinating discharge: 35 minutes  SIGNED:   Rodena Goldmann, DO [facility] 05/03/2018, 10:18 AM  If 7PM-7AM, please contact night-coverage [email or web address] Password [email or web address]

## 2018-05-04 ENCOUNTER — Other Ambulatory Visit: Payer: Self-pay

## 2018-05-04 ENCOUNTER — Ambulatory Visit (HOSPITAL_COMMUNITY)
Admission: RE | Admit: 2018-05-04 | Discharge: 2018-05-04 | Disposition: A | Discharge status: Home / Self Care | Payer: Medicare HMO | Source: Ambulatory Visit | Attending: Hematology | Admitting: Hematology

## 2018-05-04 DIAGNOSIS — D649 Anemia, unspecified: Secondary | ICD-10-CM | POA: Diagnosis not present

## 2018-05-04 DIAGNOSIS — I10 Essential (primary) hypertension: Secondary | ICD-10-CM | POA: Diagnosis present

## 2018-05-04 DIAGNOSIS — I4510 Unspecified right bundle-branch block: Secondary | ICD-10-CM | POA: Diagnosis present

## 2018-05-04 DIAGNOSIS — C3490 Malignant neoplasm of unspecified part of unspecified bronchus or lung: Secondary | ICD-10-CM | POA: Diagnosis not present

## 2018-05-04 DIAGNOSIS — R278 Other lack of coordination: Secondary | ICD-10-CM | POA: Diagnosis not present

## 2018-05-04 DIAGNOSIS — R9431 Abnormal electrocardiogram [ECG] [EKG]: Secondary | ICD-10-CM | POA: Diagnosis not present

## 2018-05-04 DIAGNOSIS — J189 Pneumonia, unspecified organism: Principal | ICD-10-CM | POA: Diagnosis present

## 2018-05-04 DIAGNOSIS — Z7982 Long term (current) use of aspirin: Secondary | ICD-10-CM | POA: Diagnosis not present

## 2018-05-04 DIAGNOSIS — K219 Gastro-esophageal reflux disease without esophagitis: Secondary | ICD-10-CM | POA: Diagnosis not present

## 2018-05-04 DIAGNOSIS — Z87891 Personal history of nicotine dependence: Secondary | ICD-10-CM

## 2018-05-04 DIAGNOSIS — Y95 Nosocomial condition: Secondary | ICD-10-CM | POA: Diagnosis present

## 2018-05-04 DIAGNOSIS — Z66 Do not resuscitate: Secondary | ICD-10-CM | POA: Diagnosis not present

## 2018-05-04 DIAGNOSIS — T451X5A Adverse effect of antineoplastic and immunosuppressive drugs, initial encounter: Secondary | ICD-10-CM | POA: Diagnosis not present

## 2018-05-04 DIAGNOSIS — R2689 Other abnormalities of gait and mobility: Secondary | ICD-10-CM | POA: Diagnosis not present

## 2018-05-04 DIAGNOSIS — C7931 Secondary malignant neoplasm of brain: Secondary | ICD-10-CM | POA: Diagnosis not present

## 2018-05-04 DIAGNOSIS — D701 Agranulocytosis secondary to cancer chemotherapy: Secondary | ICD-10-CM | POA: Diagnosis present

## 2018-05-04 DIAGNOSIS — C7951 Secondary malignant neoplasm of bone: Secondary | ICD-10-CM | POA: Diagnosis present

## 2018-05-04 DIAGNOSIS — E785 Hyperlipidemia, unspecified: Secondary | ICD-10-CM | POA: Diagnosis present

## 2018-05-04 DIAGNOSIS — Z8249 Family history of ischemic heart disease and other diseases of the circulatory system: Secondary | ICD-10-CM | POA: Diagnosis not present

## 2018-05-04 DIAGNOSIS — Z885 Allergy status to narcotic agent status: Secondary | ICD-10-CM | POA: Diagnosis not present

## 2018-05-04 DIAGNOSIS — Z7401 Bed confinement status: Secondary | ICD-10-CM | POA: Diagnosis not present

## 2018-05-04 DIAGNOSIS — R41841 Cognitive communication deficit: Secondary | ICD-10-CM | POA: Diagnosis not present

## 2018-05-04 DIAGNOSIS — C3412 Malignant neoplasm of upper lobe, left bronchus or lung: Secondary | ICD-10-CM | POA: Diagnosis present

## 2018-05-04 DIAGNOSIS — E1142 Type 2 diabetes mellitus with diabetic polyneuropathy: Secondary | ICD-10-CM | POA: Diagnosis not present

## 2018-05-04 DIAGNOSIS — M2550 Pain in unspecified joint: Secondary | ICD-10-CM | POA: Diagnosis not present

## 2018-05-04 DIAGNOSIS — M6281 Muscle weakness (generalized): Secondary | ICD-10-CM | POA: Diagnosis not present

## 2018-05-04 DIAGNOSIS — R2681 Unsteadiness on feet: Secondary | ICD-10-CM | POA: Diagnosis not present

## 2018-05-04 DIAGNOSIS — R0602 Shortness of breath: Secondary | ICD-10-CM | POA: Diagnosis not present

## 2018-05-04 DIAGNOSIS — E860 Dehydration: Secondary | ICD-10-CM | POA: Diagnosis not present

## 2018-05-04 DIAGNOSIS — C3492 Malignant neoplasm of unspecified part of left bronchus or lung: Secondary | ICD-10-CM | POA: Insufficient documentation

## 2018-05-04 DIAGNOSIS — Z20828 Contact with and (suspected) exposure to other viral communicable diseases: Secondary | ICD-10-CM | POA: Diagnosis present

## 2018-05-04 DIAGNOSIS — R0902 Hypoxemia: Secondary | ICD-10-CM | POA: Diagnosis present

## 2018-05-04 DIAGNOSIS — I959 Hypotension, unspecified: Secondary | ICD-10-CM | POA: Diagnosis not present

## 2018-05-04 DIAGNOSIS — J181 Lobar pneumonia, unspecified organism: Secondary | ICD-10-CM | POA: Diagnosis not present

## 2018-05-04 DIAGNOSIS — Z79899 Other long term (current) drug therapy: Secondary | ICD-10-CM

## 2018-05-04 DIAGNOSIS — Z681 Body mass index (BMI) 19 or less, adult: Secondary | ICD-10-CM | POA: Diagnosis not present

## 2018-05-04 MED ORDER — FLUDEOXYGLUCOSE F - 18 (FDG) INJECTION
7.7600 | Freq: Once | INTRAVENOUS | Status: AC | PRN
  Administered 2018-05-04: 7.76 via INTRAVENOUS

## 2018-05-05 ENCOUNTER — Other Ambulatory Visit: Payer: Self-pay

## 2018-05-05 ENCOUNTER — Emergency Department (HOSPITAL_COMMUNITY): Payer: Medicare HMO

## 2018-05-05 ENCOUNTER — Inpatient Hospital Stay (HOSPITAL_COMMUNITY)
Admission: EM | Admit: 2018-05-05 | Discharge: 2018-05-08 | DRG: 194 | Disposition: A | Discharge status: Skilled Nursing Facility | Payer: Medicare HMO | Attending: Internal Medicine | Admitting: Internal Medicine

## 2018-05-05 ENCOUNTER — Encounter (HOSPITAL_COMMUNITY): Payer: Self-pay | Admitting: *Deleted

## 2018-05-05 DIAGNOSIS — C3412 Malignant neoplasm of upper lobe, left bronchus or lung: Secondary | ICD-10-CM | POA: Diagnosis not present

## 2018-05-05 DIAGNOSIS — T451X5A Adverse effect of antineoplastic and immunosuppressive drugs, initial encounter: Secondary | ICD-10-CM | POA: Diagnosis not present

## 2018-05-05 DIAGNOSIS — K219 Gastro-esophageal reflux disease without esophagitis: Secondary | ICD-10-CM | POA: Diagnosis not present

## 2018-05-05 DIAGNOSIS — C7951 Secondary malignant neoplasm of bone: Secondary | ICD-10-CM | POA: Diagnosis not present

## 2018-05-05 DIAGNOSIS — C7931 Secondary malignant neoplasm of brain: Secondary | ICD-10-CM | POA: Diagnosis not present

## 2018-05-05 DIAGNOSIS — Z681 Body mass index (BMI) 19 or less, adult: Secondary | ICD-10-CM | POA: Diagnosis not present

## 2018-05-05 DIAGNOSIS — E860 Dehydration: Secondary | ICD-10-CM | POA: Diagnosis not present

## 2018-05-05 DIAGNOSIS — E1142 Type 2 diabetes mellitus with diabetic polyneuropathy: Secondary | ICD-10-CM | POA: Diagnosis not present

## 2018-05-05 DIAGNOSIS — D701 Agranulocytosis secondary to cancer chemotherapy: Secondary | ICD-10-CM | POA: Diagnosis not present

## 2018-05-05 DIAGNOSIS — J189 Pneumonia, unspecified organism: Secondary | ICD-10-CM | POA: Diagnosis not present

## 2018-05-05 DIAGNOSIS — I10 Essential (primary) hypertension: Secondary | ICD-10-CM | POA: Diagnosis not present

## 2018-05-05 DIAGNOSIS — Z66 Do not resuscitate: Secondary | ICD-10-CM | POA: Diagnosis not present

## 2018-05-05 DIAGNOSIS — C801 Malignant (primary) neoplasm, unspecified: Secondary | ICD-10-CM | POA: Diagnosis present

## 2018-05-05 DIAGNOSIS — Z20828 Contact with and (suspected) exposure to other viral communicable diseases: Secondary | ICD-10-CM | POA: Diagnosis not present

## 2018-05-05 DIAGNOSIS — C3490 Malignant neoplasm of unspecified part of unspecified bronchus or lung: Secondary | ICD-10-CM | POA: Diagnosis present

## 2018-05-05 DIAGNOSIS — J181 Lobar pneumonia, unspecified organism: Secondary | ICD-10-CM

## 2018-05-05 LAB — COMPREHENSIVE METABOLIC PANEL
ALBUMIN: 2.6 g/dL — AB (ref 3.5–5.0)
ALT: 14 U/L (ref 0–44)
AST: 27 U/L (ref 15–41)
Alkaline Phosphatase: 90 U/L (ref 38–126)
Anion gap: 11 (ref 5–15)
BUN: 12 mg/dL (ref 8–23)
CO2: 22 mmol/L (ref 22–32)
CREATININE: 0.52 mg/dL (ref 0.44–1.00)
Calcium: 8.3 mg/dL — ABNORMAL LOW (ref 8.9–10.3)
Chloride: 102 mmol/L (ref 98–111)
GFR calc Af Amer: >60 mL/min (ref >60)
GFR calc non Af Amer: >60 mL/min (ref >60)
GLUCOSE: 84 mg/dL (ref 70–99)
Potassium: 3.3 mmol/L — ABNORMAL LOW (ref 3.5–5.1)
Sodium: 135 mmol/L (ref 135–145)
Total Bilirubin: 1.2 mg/dL (ref 0.3–1.2)
Total Protein: 6.9 g/dL (ref 6.5–8.1)

## 2018-05-05 LAB — CBC WITH DIFFERENTIAL/PLATELET
Abs Immature Granulocytes: 5.16 10*3/uL — ABNORMAL HIGH (ref 0.00–0.07)
Basophils Absolute: 0 10*3/uL (ref 0.0–0.1)
Basophils Relative: 0 %
Eosinophils Absolute: 0 10*3/uL (ref 0.0–0.5)
Eosinophils Relative: 0 %
HCT: 29.5 % — ABNORMAL LOW (ref 36.0–46.0)
Hemoglobin: 8.7 g/dL — ABNORMAL LOW (ref 12.0–15.0)
Immature Granulocytes: 18 %
Lymphocytes Relative: 6 %
Lymphs Abs: 1.6 10*3/uL (ref 0.7–4.0)
MCH: 23.6 pg — ABNORMAL LOW (ref 26.0–34.0)
MCHC: 29.5 g/dL — ABNORMAL LOW (ref 30.0–36.0)
MCV: 80.2 fL (ref 80.0–100.0)
Monocytes Absolute: 3.6 10*3/uL — ABNORMAL HIGH (ref 0.1–1.0)
Monocytes Relative: 13 %
NEUTROS PCT: 63 %
Neutro Abs: 18 10*3/uL — ABNORMAL HIGH (ref 1.7–7.7)
PLATELETS: 126 10*3/uL — AB (ref 150–400)
RBC: 3.68 MIL/uL — AB (ref 3.87–5.11)
RDW: 16.2 % — ABNORMAL HIGH (ref 11.5–15.5)
WBC: 28.4 10*3/uL — ABNORMAL HIGH (ref 4.0–10.5)
nRBC: 0.5 % — ABNORMAL HIGH (ref 0.0–0.2)

## 2018-05-05 LAB — URINALYSIS, ROUTINE W REFLEX MICROSCOPIC
Bilirubin Urine: NEGATIVE
Glucose, UA: NEGATIVE mg/dL
Hgb urine dipstick: NEGATIVE
Ketones, ur: 5 mg/dL — AB
Leukocytes,Ua: NEGATIVE
NITRITE: NEGATIVE
PROTEIN: NEGATIVE mg/dL
Specific Gravity, Urine: 1.023 (ref 1.005–1.030)
pH: 7 (ref 5.0–8.0)

## 2018-05-05 LAB — BRAIN NATRIURETIC PEPTIDE: B Natriuretic Peptide: 303 pg/mL — ABNORMAL HIGH (ref 0.0–100.0)

## 2018-05-05 LAB — LACTIC ACID, PLASMA
Lactic Acid, Venous: 0.9 mmol/L (ref 0.5–1.9)
Lactic Acid, Venous: 1 mmol/L (ref 0.5–1.9)

## 2018-05-05 LAB — TROPONIN I: Troponin I: <0.03 ng/mL (ref <0.03)

## 2018-05-05 LAB — MAGNESIUM: Magnesium: 1.2 mg/dL — ABNORMAL LOW (ref 1.7–2.4)

## 2018-05-05 MED ORDER — ONDANSETRON HCL 4 MG PO TABS: 4.0000 mg | ORAL_TABLET | Freq: Four times a day (QID) | ORAL | Status: DC | PRN

## 2018-05-05 MED ORDER — HYDRALAZINE HCL 20 MG/ML IJ SOLN
10.0000 mg | Freq: Once | INTRAMUSCULAR | Status: AC
  Administered 2018-05-05: 10 mg via INTRAVENOUS
  Filled 2018-05-05: qty 1

## 2018-05-05 MED ORDER — IOHEXOL 350 MG/ML SOLN
100.0000 mL | Freq: Once | INTRAVENOUS | Status: AC | PRN
  Administered 2018-05-05: 75 mL via INTRAVENOUS

## 2018-05-05 MED ORDER — ACETAMINOPHEN 325 MG PO TABS: 650.0000 mg | ORAL_TABLET | Freq: Four times a day (QID) | ORAL | Status: DC | PRN

## 2018-05-05 MED ORDER — AMLODIPINE BESYLATE 10 MG PO TABS
10.0000 mg | ORAL_TABLET | Freq: Every day | ORAL | Status: DC
  Administered 2018-05-05 – 2018-05-08 (×4): 10 mg via ORAL
  Filled 2018-05-05: qty 1
  Filled 2018-05-05: qty 2
  Filled 2018-05-05 (×2): qty 1

## 2018-05-05 MED ORDER — ONDANSETRON HCL 4 MG/2ML IJ SOLN: 4.0000 mg | Freq: Four times a day (QID) | INTRAMUSCULAR | Status: DC | PRN

## 2018-05-05 MED ORDER — VANCOMYCIN HCL IN DEXTROSE 1-5 GM/200ML-% IV SOLN: 1000.0000 mg | INTRAVENOUS | Status: DC

## 2018-05-05 MED ORDER — POTASSIUM CHLORIDE IN NACL 40-0.9 MEQ/L-% IV SOLN
INTRAVENOUS | Status: DC
  Administered 2018-05-05: 100 mL/h via INTRAVENOUS
  Filled 2018-05-05 (×2): qty 1000

## 2018-05-05 MED ORDER — LABETALOL HCL 5 MG/ML IV SOLN
10.0000 mg | Freq: Once | INTRAVENOUS | Status: AC
  Administered 2018-05-05: 10 mg via INTRAVENOUS
  Filled 2018-05-05: qty 4

## 2018-05-05 MED ORDER — VANCOMYCIN HCL IN DEXTROSE 1-5 GM/200ML-% IV SOLN
1000.0000 mg | Freq: Once | INTRAVENOUS | Status: DC
  Administered 2018-05-05: 1000 mg via INTRAVENOUS
  Filled 2018-05-05: qty 200

## 2018-05-05 MED ORDER — PIPERACILLIN-TAZOBACTAM 3.375 G IVPB
3.3750 g | Freq: Three times a day (TID) | INTRAVENOUS | Status: DC
  Administered 2018-05-05 – 2018-05-07 (×5): 3.375 g via INTRAVENOUS
  Filled 2018-05-05 (×5): qty 50

## 2018-05-05 MED ORDER — SODIUM CHLORIDE 0.9 % IV SOLN: 1.0000 g | Freq: Three times a day (TID) | INTRAVENOUS | Status: DC

## 2018-05-05 MED ORDER — ENOXAPARIN SODIUM 40 MG/0.4ML ~~LOC~~ SOLN
40.0000 mg | SUBCUTANEOUS | Status: DC
  Administered 2018-05-05 – 2018-05-07 (×3): 40 mg via SUBCUTANEOUS
  Filled 2018-05-05 (×3): qty 0.4

## 2018-05-05 MED ORDER — SODIUM CHLORIDE 0.9 % IV SOLN
2.0000 g | Freq: Once | INTRAVENOUS | Status: AC
  Administered 2018-05-05: 2 g via INTRAVENOUS
  Filled 2018-05-05: qty 2

## 2018-05-05 MED ORDER — LABETALOL HCL 5 MG/ML IV SOLN
10.0000 mg | INTRAVENOUS | Status: DC | PRN
  Administered 2018-05-05: 10 mg via INTRAVENOUS
  Filled 2018-05-05 (×2): qty 4

## 2018-05-05 MED ORDER — SODIUM CHLORIDE 0.9 % IV BOLUS
500.0000 mL | Freq: Once | INTRAVENOUS | Status: AC
  Administered 2018-05-05: 500 mL via INTRAVENOUS

## 2018-05-05 MED ORDER — POLYETHYLENE GLYCOL 3350 17 G PO PACK: 17.0000 g | PACK | Freq: Every day | ORAL | Status: DC | PRN

## 2018-05-05 MED ORDER — ACETAMINOPHEN 650 MG RE SUPP: 650.0000 mg | Freq: Four times a day (QID) | RECTAL | Status: DC | PRN

## 2018-05-05 MED ORDER — LISINOPRIL 5 MG PO TABS
5.0000 mg | ORAL_TABLET | Freq: Every day | ORAL | Status: DC
  Administered 2018-05-05 – 2018-05-08 (×4): 5 mg via ORAL
  Filled 2018-05-05 (×4): qty 1

## 2018-05-05 NOTE — Progress Notes (Signed)
Pharmacy Antibiotic Note  Melinda Larsen is a 78 y.o. female admitted on 05/05/2018 with pneumonia.  Pharmacy has been consulted for Zosyn dosing.  Plan: Zosyn 3.375g IV Q8H infused over 4hrs.  Height: 5\' 10"  (177.8 cm) Weight: 109 lb 9.1 oz (49.7 kg) IBW/kg (Calculated) : 68.5  Temp (24hrs), Avg:97.7 F (36.5 C), Min:97.7 F (36.5 C), Max:97.7 F (36.5 C)  Recent Labs  Lab 05/01/18 2022 05/02/18 0329 05/03/18 0423 05/05/18 1316 05/05/18 1527  WBC 0.8* 1.2* 5.2 28.4*  --   CREATININE 0.60 0.43* 0.44 0.52  --   LATICACIDVEN  --   --   --  0.9 1.0    Estimated Creatinine Clearance: 46.9 mL/min (by C-G formula based on SCr of 0.52 mg/dL).    Allergies  Allergen Reactions  . Codeine Nausea Only and Other (See Comments)    Nervous/sweating    Thank you for allowing pharmacy to be a part of this patient's care.  Gretta Arab PharmD, BCPS 05/05/2018 9:47 PM

## 2018-05-05 NOTE — Progress Notes (Signed)
Pharmacy Antibiotic Note  Melinda Larsen is a 78 y.o. female admitted on 05/05/2018 with pneumonia.  Pharmacy has been consulted for vancomycin and cefepime dosing.  Plan: Vancomycin 1000mg  IV every 24 hours.  Goal trough 15-20 mcg/mL. cefepime 1gm iv q8h   Height: 5\' 10"  (177.8 cm) Weight: 109 lb 9.1 oz (49.7 kg) IBW/kg (Calculated) : 68.5  Temp (24hrs), Avg:97.7 F (36.5 C), Min:97.7 F (36.5 C), Max:97.7 F (36.5 C)  Recent Labs  Lab 05/01/18 2022 05/02/18 0329 05/03/18 0423 05/05/18 1316  WBC 0.8* 1.2* 5.2 28.4*  CREATININE 0.60 0.43* 0.44 0.52  LATICACIDVEN  --   --   --  0.9    Estimated Creatinine Clearance: 46.9 mL/min (by C-G formula based on SCr of 0.52 mg/dL).    Allergies  Allergen Reactions  . Codeine Nausea Only and Other (See Comments)    Nervous/sweating    Antimicrobials this admission: 3/31 cefepime >>  3/31 vancomycin >>   Microbiology results: 3/31 BCx: sent 3/31 MRSA PCR: sent   Thank you for allowing pharmacy to be a part of this patient's care.  Donna Christen Lissette Schenk 05/05/2018 4:18 PM

## 2018-05-05 NOTE — ED Notes (Signed)
[facility] arrived to get patient  

## 2018-05-05 NOTE — ED Provider Notes (Signed)
Emergency Department Provider Note   I have reviewed the triage vital signs and the nursing notes.   HISTORY  Chief Complaint Weakness   HPI Melinda Larsen is a 78 y.o. female with PMH of HLD, HTN, lung cancer, and anemia returns to the emergency department for evaluation of worsening shortness of breath and generalized weakness.   Is coming directly from her PCPs office Dr. Gerarda Fraction.  Patient is actively undergoing chemotherapy.  She was treated and discharged from the hospital after being diagnosed with pneumonia.  Patient was discharged 2 days prior and was seeing Dr. Gerarda Fraction as a follow-up visit.  She describes worsening generalized weakness and continued shortness of breath.  She does not have chest pain.  She is unsure regarding fevers. No known COVID exposure.  Patient does not wear oxygen at home at baseline.  She is not experiencing any abdominal discomfort, vomiting, diarrhea. No recent travel. Patient has been compliant with Augmentin at home.    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   Non-small cell lung cancer (NSCLC) ([place]) 05/02/2018   Post-obstructive pneumonia due to foreign body aspiration 05/02/2018   Postobstructive pneumonia 05/02/2018   Metastasis to bone ([place]) 03/24/2018   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   Lung cancer ([place]) 12/23/2017   Special screening for malignant neoplasms, colon     Past Surgical History:  Procedure Laterality Date   ABDOMINAL HYSTERECTOMY     APPENDECTOMY     COLONOSCOPY N/A 07/16/2017   Procedure: COLONOSCOPY;  Surgeon: Danie Binder,  MD;  Location: [facility];  Service: Endoscopy;  Laterality: N/A;  10:30   IR IMAGING GUIDED PORT INSERTION  12/24/2017   POLYPECTOMY  07/16/2017   Procedure: POLYPECTOMY;  Surgeon: Danie Binder, MD;  Location: [facility];  Service: Endoscopy;;  cecal x2;hepatic flexurex5; splenic x1;descending x2   VIDEO BRONCHOSCOPY WITH ENDOBRONCHIAL ULTRASOUND N/A 12/17/2017   Procedure: VIDEO BRONCHOSCOPY WITH ENDOBRONCHIAL ULTRASOUND;  Surgeon: Melrose Nakayama, MD;  Location: [facility] OR;  Service: Thoracic;  Laterality: N/A;    Allergies Codeine  Family History  Problem Relation Age of Onset   Hypertension Mother    Hypertension Father    Colon cancer Neg Hx    Colon polyps Neg Hx     Social History Social History   Tobacco Use   Smoking status: Former Smoker    Packs/day: 1.00    Years: 40.00    Pack years: 40.00    Types: Cigarettes    Last attempt to quit: 12/09/2012    Years since quitting: 5.4   Smokeless tobacco: Never Used   Tobacco comment: off and on since her teens  Substance Use Topics   Alcohol use: No   Drug use: No    Review of Systems  Constitutional: No fever/chills. Positive generalized weakness.  Eyes: No visual changes. ENT: No sore throat. Cardiovascular: Denies chest pain. Respiratory: Positive shortness of breath and cough.  Gastrointestinal: No abdominal pain.  No nausea, no vomiting.  No diarrhea.  No constipation. Genitourinary: Negative for  dysuria. Musculoskeletal: Negative for back pain. Skin: Negative for rash. Neurological: Negative for headaches, focal weakness or numbness.  10-point ROS otherwise negative.  ____________________________________________   PHYSICAL EXAM:  VITAL SIGNS: ED Triage Vitals  Enc Vitals Group     BP 05/05/18 1249 (!) 164/83     Pulse Rate 05/05/18 1249 82     Resp 05/05/18 1249 18     Temp 05/05/18 1249 97.7 F (36.5 C)     Temp Source 05/05/18 1249 Oral     SpO2 05/05/18 1249 96 %      Weight 05/05/18 1251 109 lb 9.1 oz (49.7 kg)     Height 05/05/18 1251 5\' 10"  (1.778 m)     Pain Score 05/05/18 1251 7   Constitutional: Alert and oriented. Well appearing and in no acute distress. Eyes: Conjunctivae are normal.  Head: Atraumatic. Nose: No congestion/rhinnorhea. Mouth/Throat: Mucous membranes are moist. Neck: No stridor.  Cardiovascular: Normal rate, regular rhythm. Good peripheral circulation. Grossly normal heart sounds.   Respiratory: Normal respiratory effort.  No retractions. Lungs CTAB. Gastrointestinal: Soft and nontender. No distention.  Musculoskeletal: No lower extremity tenderness nor edema. No gross deformities of extremities. Neurologic:  Normal speech and language. No gross focal neurologic deficits are appreciated.  Skin:  Skin is warm, dry and intact. No rash noted.  ____________________________________________   LABS (all labs ordered are listed, but only abnormal results are displayed)  Labs Reviewed  COMPREHENSIVE METABOLIC PANEL - Abnormal; Notable for the following components:      Result Value   Potassium 3.3 (*)    Calcium 8.3 (*)    Albumin 2.6 (*)    All other components within normal limits  BRAIN NATRIURETIC PEPTIDE - Abnormal; Notable for the following components:   B Natriuretic Peptide 303.0 (*)    All other components within normal limits  CBC WITH DIFFERENTIAL/PLATELET - Abnormal; Notable for the following components:   WBC 28.4 (*)    RBC 3.68 (*)    Hemoglobin 8.7 (*)    HCT 29.5 (*)    MCH 23.6 (*)    MCHC 29.5 (*)    RDW 16.2 (*)    Platelets 126 (*)    nRBC 0.5 (*)    Neutro Abs 18.0 (*)    Monocytes Absolute 3.6 (*)    Abs Immature Granulocytes 5.16 (*)    All other components within normal limits  URINALYSIS, ROUTINE W REFLEX MICROSCOPIC - Abnormal; Notable for the following components:   Color, Urine AMBER (*)    Ketones, ur 5 (*)    All other components within normal limits  MAGNESIUM - Abnormal; Notable for  the following components:   Magnesium 1.2 (*)    All other components within normal limits  CULTURE, BLOOD (ROUTINE X 2)  CULTURE, BLOOD (ROUTINE X 2)  MRSA PCR SCREENING  NOVEL CORONAVIRUS, NAA (HOSPITAL ORDER, SEND-OUT TO REF LAB)  TROPONIN I  LACTIC ACID, PLASMA  LACTIC ACID, PLASMA   ____________________________________________  EKG  Rate: 70 QTc: 484  Sinus arrhythmia. RBBB. Narrow QRS. No ST elevation or depression. Similar to prior. No STEMI.  ____________________________________________  RADIOLOGY  Ct Angio Chest Pe W And/or Wo Contrast  Result Date: 05/05/2018 CLINICAL DATA:  78 year old female with history of lung cancer and recent history of pneumonia. Persistent weakness and shortness of breath. EXAM: CT ANGIOGRAPHY CHEST WITH CONTRAST TECHNIQUE: Multidetector CT imaging of the chest was performed using the standard protocol during bolus administration of intravenous contrast. Multiplanar CT image  reconstructions and MIPs were obtained to evaluate the vascular anatomy. CONTRAST:  58mL OMNIPAQUE IOHEXOL 350 MG/ML SOLN COMPARISON:  Chest CT 05/01/2018.  PET-CT 05/04/2018. FINDINGS: Cardiovascular: No filling defects within the pulmonary arterial tree to suggest underlying pulmonary embolism. Heart size is borderline enlarged. There is no significant pericardial fluid, thickening or pericardial calcification. There is aortic atherosclerosis, as well as atherosclerosis of the great vessels of the mediastinum and the coronary arteries, including calcified atherosclerotic plaque in the left anterior descending coronary artery. Thickening calcification of the aortic valve. Right internal jugular single-lumen porta cath with tip terminating in the right atrium. Mediastinum/Nodes: Bulky nodal mass involving portions of the left hilar region as well as the the left mediastinum, overall measuring approximately 7.8 x 5.2 cm. Esophagus is unremarkable in appearance. No axillary  lymphadenopathy. Lungs/Pleura: Persistent airspace consolidation in the left upper lobe, most evident in the posterior aspect of the left upper lobe, most compatible with postobstructive pneumonia. Extensive bronchial obstruction throughout the left upper lobe and left lower lobe related to mucoid impaction. Minimal dependent consolidation, and extensive atelectasis in the basal segments of the left lower lobe. Dependent subsegmental atelectasis in the right lower lobe. Trace right pleural effusion. Small left pleural effusion lying dependently. Diffuse bronchial wall thickening with moderate centrilobular and paraseptal emphysema. Upper Abdomen: Unremarkable. Musculoskeletal: Expansile lytic lesion in the lateral aspect of the right fourth rib (axial image 65 of series 6), and expansile lytic lesion in the posterior aspect of the left eleventh rib (axial image 122 of series 6), similar to prior studies. Review of the MIP images confirms the above findings. IMPRESSION: 1. No evidence of pulmonary embolism. 2. Large mass centered in the left hilar region and left mediastinum currently measuring approximately 5.2 x 7.8 cm, as above. This is associated with extensive postobstructive changes throughout the left lung, compatible with a combination of multilobar pneumonia and atelectasis. Small left pleural effusion lying dependently. 3. Trace right pleural effusion lying dependently with some associated passive subsegmental atelectasis in the right lower lobe. 4. Metastatic lesions in the lateral right fourth rib and posterior left eleventh rib again noted. 5. Diffuse bronchial wall thickening with moderate centrilobular and paraseptal emphysema. 6. There are calcifications of the aortic valve. Echocardiographic correlation for evaluation of potential valvular dysfunction may be warranted if clinically indicated. 7. Aortic atherosclerosis, in addition to left anterior descending coronary artery disease. Assessment for  potential risk factor modification, dietary therapy or pharmacologic therapy may be warranted, if clinically indicated. Aortic Atherosclerosis (ICD10-I70.0) and Emphysema (ICD10-J43.9). Electronically Signed   By: Vinnie Langton M.D.   On: 05/05/2018 15:44   Nm Pet Image Restag (ps) Skull Base To Thigh  Result Date: 05/05/2018 CLINICAL DATA:  Subsequent treatment strategy for non-small cell lung cancer. EXAM: NUCLEAR MEDICINE PET SKULL BASE TO THIGH TECHNIQUE: 7.76 mCi F-18 FDG was injected intravenously. Full-ring PET imaging was performed from the skull base to thigh after the radiotracer. CT data was obtained and used for attenuation correction and anatomic localization. Fasting blood glucose: 86 mg/dl COMPARISON:  03/12/2018 FINDINGS: Mediastinal blood pool activity: SUV max 2.08 NECK: Previous right lobe of thyroid gland nodule has an SUV max of 2.8. Previously 4.4. No hypermetabolic lymph nodes within the soft tissues of the neck. Incidental CT findings: none CHEST: The left upper lobe paramediastinal lung mass which invades the AP window region measures 7.4 cm on today's exam and has an SUV max of free 8.32. Previously this measured 5.5 cm within SUV max 11.12.  Hypermetabolic left hilar lymph node measures 1.8 cm and has an SUV max of 7.1. Previously 1.9 cm within SUV max of 9.9. Increase in postobstructive changes within the posterior left upper lobe, image 83/3. There is a new small to moderate left pleural effusion. Dense airspace consolidation within the left lower lobe is identified with corresponding increased uptake within SUV max of 7.6. New subsegmental atelectasis within the posterior right base. Incidental CT findings: Centrilobular emphysema. Aortic atherosclerosis. Lad coronary artery calcifications. ABDOMEN/PELVIS: Within the left upper quadrant of the abdomen there is a new hypermetabolic nodule along the undersurface of the left hemidiaphragm anteriorly which measures approximately 1.3  cm and has an SUV max of 6.65. No abnormal uptake identified within the liver, pancreas. Similar mild asymmetric hypermetabolism within the left adrenal gland. Diffuse increased uptake identified throughout the liver which is favored to represent. Post treatment changes. No hypermetabolic abdominopelvic adenopathy. Incidental CT findings: none SKELETON: Hypermetabolic lytic lesion involving the right iliac bone is again noted measuring 1.5 cm within SUV max of 12.8. Hypermetabolic right fourth rib metastases appears lytic. This measures 2 cm within SUV max of 4.9. Previously this measured 1 cm with SUV max of 4.68. New hypermetabolic lesion within the right sacrum has an SUV max of 6.04. This measures approximately 9 mm, image 240/3. The hypermetabolic lesion involving the proximal left femur appears increased in size. SUV max is equal to 5.75. Previously 6.3. Lytic lesion involving the greater trochanter measures 1.3 cm and has an SUV max of 4.6. Previously this measured 0.8 cm within SUV max of 4.9. Of potential orthopedic significance is a progressive cortical lesion involving the right femoral neck which measures 1.8 cm and has an SUV max of 4.3. Previously 0.7 cm with SUV max of 3.9. Lytic lesion involving the superior endplate of L2 measures 1.4 cm without significant FDG uptake above background activity. On the previous exam this measured 0.5 cm. Also in the L2 vertebra is a 9 mm lytic lesion without significant uptake above background activity. This lesion was barely visible on previous exam. Lytic lesion involving the posterior aspect of the left eleventh rib measures 9 mm with SUV max of 5.76. Previously 6 mm with SUV max of 5.02. Multifocal hypermetabolic intramuscular metastases. Left gluteal soft tissue metastasis has an SUV max of 5.34. Previously 3.84. Left paraspinous muscle metastasis has an SUV max of 4 point 1 4. New from previous exam. Right gluteal metastasis has an SUV max of 3.19. Previously  2.09 Incidental CT findings: none IMPRESSION: 1. Overall interval progression of disease. 2. Increase in size of left upper lobe lung mass with involvement of the left hilum and mediastinum. There is new postobstructive pneumonitis within the posterior left upper lobe. 3. 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 Compared with previous exam most of these lesions have increased in size in the interval. Variable FDG uptake associated with these metastases are noted, similar to previous study. A few lesions which are larger exhibit decreased FDG uptake from previous study. Other enlarging lytic lesions exhibit increased uptake. Progressive lytic lesion involving the right femoral neck may be of orthopedic significance. 4. Skeletal muscle metastasis appear slightly progressive from previous exam. 5. New hypermetabolic nodule is identified within the left upper quadrant of the abdomen abutting the undersurface of the left hemidiaphragm. Suspicious for metastasis. 6. New left pleural effusion and new bilateral lower lobe airspace consolidation, left greater than right. Findings are favored to represent sequelae of pneumonia and/or aspiration. 7. Aortic Atherosclerosis (  ICD10-I70.0) and Emphysema (ICD10-J43.9). 8. Coronary artery calcifications Electronically Signed   By: Kerby Moors M.D.   On: 05/05/2018 00:50    ____________________________________________   PROCEDURES  Procedure(s) performed:   Procedures  CRITICAL CARE Performed by: Margette Fast Total critical care time: 35 minutes Critical care time was exclusive of separately billable procedures and treating other patients. Critical care was necessary to treat or prevent imminent or life-threatening deterioration. Critical care was time spent personally by me on the following activities: development of treatment plan with patient and/or surrogate as well as nursing, discussions with consultants, evaluation of patient's response to  treatment, examination of patient, obtaining history from patient or surrogate, ordering and performing treatments and interventions, ordering and review of laboratory studies, ordering and review of radiographic studies, pulse oximetry and re-evaluation of patient's condition.  Nanda Quinton, MD Emergency Medicine  ____________________________________________   INITIAL IMPRESSION / ASSESSMENT AND PLAN / ED COURSE  Pertinent labs & imaging results that were available during my care of the patient were reviewed by me and considered in my medical decision making (see chart for details).   Patient presents to the emergency department with worsening shortness of breath and generalized weakness.  She did have a PET scan today.  She has a new oxygen requirement here with oxygen saturation of 88% at rest.  Patient in no acute respiratory distress.  Reviewed CT imaging from her last admission. Does not appear to have been tested for COVID and thought to be low risk overall.  Patient's vitals here other than hypoxemia show elevated blood pressure and slight elevated respiratory rate.  No wheezing on exam.  Will avoid nebulizing treatments and have placed on Contact/Droplet for now. Plan for CTA of the chest along with labs and IVF. Clinically very dry and dehydrated which may explain weakness symptoms.   CTA negative for PE. PNA remains on CT which is noted to be likely post-obstructive. Also considering COVID but decision to test deferred to inpatient team. Starting abx covering for HCAP. With new O2 requirement plan for admit. Labs reviewed. Doubt sepsis.   Discussed patient's case with Hospitalist to request admission. Patient and family (if present) updated with plan. Care transferred to Hospitalist service.  I reviewed all nursing notes, vitals, pertinent old records, EKGs, labs, imaging (as available).  ____________________________________________  FINAL CLINICAL IMPRESSION(S) / ED  DIAGNOSES  Final diagnoses:  HCAP (healthcare-associated pneumonia)     MEDICATIONS GIVEN DURING THIS VISIT:  Medications  amLODipine (NORVASC) tablet 10 mg (10 mg Oral Given 05/05/18 1831)  sodium chloride 0.9 % bolus 500 mL (0 mLs Intravenous Stopped 05/05/18 1407)  hydrALAZINE (APRESOLINE) injection 10 mg (10 mg Intravenous Given 05/05/18 1426)  iohexol (OMNIPAQUE) 350 MG/ML injection 100 mL (75 mLs Intravenous Contrast Given 05/05/18 1504)  ceFEPIme (MAXIPIME) 2 g in sodium chloride 0.9 % 100 mL IVPB (0 g Intravenous Stopped 05/05/18 1707)  labetalol (NORMODYNE,TRANDATE) injection 10 mg (10 mg Intravenous Given 05/05/18 1832)    Note:  This document was prepared using Dragon voice recognition software and may include unintentional dictation errors.  Nanda Quinton, MD Emergency Medicine    [name], [facility], MD 05/05/18 [phone]

## 2018-05-05 NOTE — ED Triage Notes (Signed)
Pt states she was just discharged from [facility] today after spending 4 days with pneumonia; pt states she still feels weak; pt still c/o sob

## 2018-05-05 NOTE — ED Notes (Signed)
Have notified Dr. Laverta Baltimore of increasing BP. Told to wait for Hydralazine to take effect

## 2018-05-05 NOTE — ED Notes (Signed)
Notified Dr. Laverta Baltimore of HTN

## 2018-05-05 NOTE — H&P (Signed)
History and Physical    CHRISTYNA LETENDRE ERX:[id number] DOB: Oct 19, 1940 DOA: 05/05/2018  PCP: Redmond School, MD   Patient coming from: HOME  I have personally briefly reviewed patient's old medical records in [place]  Chief Complaint: SOB, Weakness  HPI: Melinda Larsen is a 78 y.o. 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  Patient denies cough, no fever or chills, no sore throat.  She admits to nasal congestion recently, but no body aches.  No chest pain.  No leg swelling.  Patient reports she has been home except for recent hospitalization, she lives with her son and has seen her sisters in the past 2 weeks.  She denies sick contacts or recent travel.  Patient saw her primary care provider today and because of symptoms she was referred to the ED.  Patient was just discharged from the hospital after hospitalization 3/27- 05/02/18 for similar presentation.  Patient was managed for postobstructive pneumonia with vancomycin and cefepime.  And discharged home on Augmentin.  Patient reports compliance with medication.  During the course of hospitalization patient was also given a dose of Neupogen for neutropenia.  ED Course: EDP reports O2 sats down to 88% on room air placed on 2 L nasal cannula.  Mild tachypnea to 25.  Temperature 97.7.  Elevated blood pressure systolic 950D to 326Z.  Significant leukocytosis of 28.  CTA chest was negative for PE but showed large mass in left hilar region and left mediastinum, with extensive postobstructive changes throughout the left lung compatible with multilobar pneumonia and atelectasis.  Metastatic lesions also present on ribs, also with emphysematous changes.  Patient was started on IV Vanco and cefepime.  Hospitalist to admit for postobstructive  pneumonia.  Review of Systems: As per HPI all other systems reviewed and negative.  Past Medical History:  Diagnosis Date   Anemia    Borderline diabetes    Dyspnea    Hyperlipidemia    Hypertension    Lung cancer ([place]) 12/23/2017   Metastasis to bone ([place]) 03/24/2018   Pneumonia    Pre-diabetes     Past Surgical History:  Procedure Laterality Date   ABDOMINAL HYSTERECTOMY     APPENDECTOMY     COLONOSCOPY N/A 07/16/2017   Procedure: COLONOSCOPY;  Surgeon: Danie Binder, MD;  Location[facility];  Service: Endoscopy;  Laterality: N/A;  10:30   IR IMAGING GUIDED PORT INSERTION  12/24/2017   POLYPECTOMY  07/16/2017   Procedure: POLYPECTOMY;  Surgeon: Danie Binder, MD;  Location: [facility];  Service: Endoscopy;;  cecal x2;hepatic flexurex5; splenic x1;descending x2   VIDEO BRONCHOSCOPY WITH ENDOBRONCHIAL ULTRASOUND N/A 12/17/2017   Procedure: VIDEO BRONCHOSCOPY WITH ENDOBRONCHIAL ULTRASOUND;  Surgeon: Melrose Nakayama, MD;  Location: [facility];  Service: Thoracic;  Laterality: N/A;     reports that she quit smoking about 5 years ago. Her smoking use included cigarettes. She has a 40.00 pack-year smoking history. She has never used smokeless tobacco. She reports that she does not drink alcohol or use drugs.  Allergies  Allergen Reactions   Codeine Nausea Only and Other (See Comments)    Nervous/sweating    Family History  Problem Relation Age of Onset   Hypertension Mother    Hypertension Father    Colon cancer Neg Hx    Colon polyps Neg  Hx     Prior to Admission medications   Medication Sig Start Date End Date Taking? Authorizing Provider  ACCU-CHEK GUIDE test strip  04/17/18  Yes [provider]  albuterol (PROVENTIL HFA;VENTOLIN HFA) 108 (90 Base) MCG/ACT inhaler Inhale 2 puffs into the lungs every 4 (four) hours as needed for wheezing or shortness of breath (or coughing). 11/16/17  Yes Francine Graven, DO  amLODipine (NORVASC)  5 MG tablet Take 5 mg by mouth every evening.    Yes [provider]  amoxicillin-clavulanate (AUGMENTIN) 875-125 MG tablet Take 1 tablet by mouth 2 (two) times daily for 7 days. 05/03/18 05/10/18 Yes Shah, Pratik D, DO  aspirin EC 81 MG tablet Take 81 mg by mouth every evening.    Yes [provider]  benzonatate (TESSALON PERLES) 100 MG capsule Take 1 capsule (100 mg total) by mouth every 6 (six) hours as needed for cough. 05/03/18 05/03/19 Yes Shah, Pratik D, DO  dexamethasone (DECADRON) 4 MG tablet Take 1 tab two times a day the day before Alimta chemo, then take 2 tabs once a day for 3 days starting the day after chemo. 03/30/18  Yes Higgs, Mathis Dad, MD  doxycycline (MONODOX) 100 MG capsule Take 1 capsule by mouth 2 (two) times daily. 05/05/18  Yes [provider]  folic acid (FOLVITE) 1 MG tablet Take 1 tablet (1 mg total) by mouth daily. Start 5-7 days before Alimta chemotherapy. Continue until 21 days after Alimta completed. 12/23/17  Yes Higgs, Mathis Dad, MD  HYDROcodone-acetaminophen (NORCO/VICODIN) 5-325 MG tablet Take 1 tablet by mouth every 4 (four) hours as needed for moderate pain. 1 or 2 tabs PO q4 hours prn pain 03/31/18  Yes Higgs, Mathis Dad, MD  Ipratropium-Albuterol (COMBIVENT) 20-100 MCG/ACT AERS respimat Inhale 1 puff into the lungs every 6 (six) hours as needed for up to 30 days for wheezing or shortness of breath. 05/03/18 06/02/18 Yes Shah, Pratik D, DO  lisinopril (PRINIVIL,ZESTRIL) 5 MG tablet Take 5 mg by mouth every evening.    Yes [provider]  mirtazapine (REMERON) 15 MG tablet Take 1 tablet (15 mg total) by mouth at bedtime. 04/21/18  Yes Derek Jack, MD  Accu-Chek FastClix Lancets [place]  04/17/18   [provider]  Blood Glucose Monitoring Suppl (ACCU-CHEK GUIDE) w/Device KIT  04/17/18   [provider]  feeding supplement, ENSURE ENLIVE, (ENSURE ENLIVE) LIQD Take 237 mLs by mouth 3 (three) times daily between meals. 05/03/18    Manuella Ghazi, Pratik D, DO  simvastatin (ZOCOR) 40 MG tablet Take 40 mg by mouth every evening.     [provider]    Physical Exam: Vitals:   05/05/18 1430 05/05/18 1500 05/05/18 1530 05/05/18 1600  BP: (!) 189/68 (!) 212/98 (!) 191/64 (!) 181/68  Pulse: 72 67 67 73  Resp: (!) 23 (!) 25 (!) 22 (!) 23  Temp:      TempSrc:      SpO2: 93% 98% 99% 91%  Weight:      Height:        Constitutional: NAD, calm, comfortable Vitals:   05/05/18 1430 05/05/18 1500 05/05/18 1530 05/05/18 1600  BP: (!) 189/68 (!) 212/98 (!) 191/64 (!) 181/68  Pulse: 72 67 67 73  Resp: (!) 23 (!) 25 (!) 22 (!) 23  Temp:      TempSrc:      SpO2: 93% 98% 99% 91%  Weight:      Height:       Eyes: PERRL, lids and  conjunctivae normal ENMT: Mucous membranes are dry. Posterior pharynx clear of any exudate or lesions.Normal dentition.  Neck: normal, supple, no masses, no thyromegaly Respiratory: Normal respiratory effort. No accessory muscle use.  Cardiovascular: Regular rate and rhythm, No extremity edema. 2+ pedal pulses.  Abdomen: no tenderness, no masses palpated. No hepatosplenomegaly. Bowel sounds positive.  Musculoskeletal: no clubbing / cyanosis. No joint deformity upper and lower extremities. Good ROM, no contractures. Normal muscle tone.  Skin: skin very dry and scaly, no rashes, lesions, ulcers. No induration Neurologic: CN 2-12 grossly intact. Strength 5/5 in all 4.  Psychiatric: Normal judgment and insight. Alert and oriented x 3. Normal mood.   Labs on Admission: I have personally reviewed following labs and imaging studies  CBC: Recent Labs  Lab 05/01/18 2022 05/02/18 0329 05/03/18 0423 05/05/18 1316  WBC 0.8* 1.2* 5.2 28.4*  NEUTROABS 0.2* 0.3* 2.8 18.0*  HGB 9.7* 8.0* 8.1* 8.7*  HCT 33.7* 27.9* 27.3* 29.5*  MCV 81.0 80.4 79.6* 80.2  PLT 55* 44* 37* 789*   Basic Metabolic Panel: Recent Labs  Lab 05/01/18 2022 05/02/18 0328 05/02/18 0329 05/03/18 0423 05/05/18 1316  NA 136   --  138 137 135  K 3.1*  --  2.9* 3.9 3.3*  CL 100  --  107 108 102  CO2 24  --  '24 22 22  '$ GLUCOSE 147*  --  120* 118* 84  BUN 14  --  '12 14 12  '$ CREATININE 0.60  --  0.43* 0.44 0.52  CALCIUM 8.4*  --  7.7* 8.1* 8.3*  MG  --   --  1.3* 1.6*  --   PHOS  --  1.2*  --   --   --    Liver Function Tests: Recent Labs  Lab 05/01/18 2022 05/05/18 1316  AST 29 27  ALT 17 14  ALKPHOS 67 90  BILITOT 0.8 1.2  PROT 7.4 6.9  ALBUMIN 2.7* 2.6*   Cardiac Enzymes: Recent Labs  Lab 05/05/18 1316  TROPONINI <0.03    Radiological Exams on Admission: Ct Angio Chest Pe W And/or Wo Contrast  Result Date: 05/05/2018 CLINICAL DATA:  78 year old female with history of lung cancer and recent history of pneumonia. Persistent weakness and shortness of breath. EXAM: CT ANGIOGRAPHY CHEST WITH CONTRAST TECHNIQUE: Multidetector CT imaging of the chest was performed using the standard protocol during bolus administration of intravenous contrast. Multiplanar CT image reconstructions and MIPs were obtained to evaluate the vascular anatomy. CONTRAST:  28m OMNIPAQUE IOHEXOL 350 MG/ML SOLN COMPARISON:  Chest CT 05/01/2018.  PET-CT 05/04/2018. FINDINGS: Cardiovascular: No filling defects within the pulmonary arterial tree to suggest underlying pulmonary embolism. Heart size is borderline enlarged. There is no significant pericardial fluid, thickening or pericardial calcification. There is aortic atherosclerosis, as well as atherosclerosis of the great vessels of the mediastinum and the coronary arteries, including calcified atherosclerotic plaque in the left anterior descending coronary artery. Thickening calcification of the aortic valve. Right internal jugular single-lumen porta cath with tip terminating in the right atrium. Mediastinum/Nodes: Bulky nodal mass involving portions of the left hilar region as well as the the left mediastinum, overall measuring approximately 7.8 x 5.2 cm. Esophagus is unremarkable in  appearance. No axillary lymphadenopathy. Lungs/Pleura: Persistent airspace consolidation in the left upper lobe, most evident in the posterior aspect of the left upper lobe, most compatible with postobstructive pneumonia. Extensive bronchial obstruction throughout the left upper lobe and left lower lobe related to mucoid impaction. Minimal dependent consolidation, and extensive atelectasis  in the basal segments of the left lower lobe. Dependent subsegmental atelectasis in the right lower lobe. Trace right pleural effusion. Small left pleural effusion lying dependently. Diffuse bronchial wall thickening with moderate centrilobular and paraseptal emphysema. Upper Abdomen: Unremarkable. Musculoskeletal: Expansile lytic lesion in the lateral aspect of the right fourth rib (axial image 65 of series 6), and expansile lytic lesion in the posterior aspect of the left eleventh rib (axial image 122 of series 6), similar to prior studies. Review of the MIP images confirms the above findings. IMPRESSION: 1. No evidence of pulmonary embolism. 2. Large mass centered in the left hilar region and left mediastinum currently measuring approximately 5.2 x 7.8 cm, as above. This is associated with extensive postobstructive changes throughout the left lung, compatible with a combination of multilobar pneumonia and atelectasis. Small left pleural effusion lying dependently. 3. Trace right pleural effusion lying dependently with some associated passive subsegmental atelectasis in the right lower lobe. 4. Metastatic lesions in the lateral right fourth rib and posterior left eleventh rib again noted. 5. Diffuse bronchial wall thickening with moderate centrilobular and paraseptal emphysema. 6. There are calcifications of the aortic valve. Echocardiographic correlation for evaluation of potential valvular dysfunction may be warranted if clinically indicated. 7. Aortic atherosclerosis, in addition to left anterior descending coronary artery  disease. Assessment for potential risk factor modification, dietary therapy or pharmacologic therapy may be warranted, if clinically indicated. Aortic Atherosclerosis (ICD10-I70.0) and Emphysema (ICD10-J43.9). Electronically Signed   By: Vinnie Langton M.D.   On: 05/05/2018 15:44   Nm Pet Image Restag (ps) Skull Base To Thigh  Result Date: 05/05/2018 CLINICAL DATA:  Subsequent treatment strategy for non-small cell lung cancer. EXAM: NUCLEAR MEDICINE PET SKULL BASE TO THIGH TECHNIQUE: 7.76 mCi F-18 FDG was injected intravenously. Full-ring PET imaging was performed from the skull base to thigh after the radiotracer. CT data was obtained and used for attenuation correction and anatomic localization. Fasting blood glucose: 86 mg/dl COMPARISON:  03/12/2018 FINDINGS: Mediastinal blood pool activity: SUV max 2.08 NECK: Previous right lobe of thyroid gland nodule has an SUV max of 2.8. Previously 4.4. No hypermetabolic lymph nodes within the soft tissues of the neck. Incidental CT findings: none CHEST: The left upper lobe paramediastinal lung mass which invades the AP window region measures 7.4 cm on today's exam and has an SUV max of free 8.32. Previously this measured 5.5 cm within SUV max 11.12. Hypermetabolic left hilar lymph node measures 1.8 cm and has an SUV max of 7.1. Previously 1.9 cm within SUV max of 9.9. Increase in postobstructive changes within the posterior left upper lobe, image 83/3. There is a new small to moderate left pleural effusion. Dense airspace consolidation within the left lower lobe is identified with corresponding increased uptake within SUV max of 7.6. New subsegmental atelectasis within the posterior right base. Incidental CT findings: Centrilobular emphysema. Aortic atherosclerosis. Lad coronary artery calcifications. ABDOMEN/PELVIS: Within the left upper quadrant of the abdomen there is a new hypermetabolic nodule along the undersurface of the left hemidiaphragm anteriorly which  measures approximately 1.3 cm and has an SUV max of 6.65. No abnormal uptake identified within the liver, pancreas. Similar mild asymmetric hypermetabolism within the left adrenal gland. Diffuse increased uptake identified throughout the liver which is favored to represent. Post treatment changes. No hypermetabolic abdominopelvic adenopathy. Incidental CT findings: none SKELETON: Hypermetabolic lytic lesion involving the right iliac bone is again noted measuring 1.5 cm within SUV max of 12.8. Hypermetabolic right fourth rib metastases appears lytic.  This measures 2 cm within SUV max of 4.9. Previously this measured 1 cm with SUV max of 4.68. New hypermetabolic lesion within the right sacrum has an SUV max of 6.04. This measures approximately 9 mm, image 240/3. The hypermetabolic lesion involving the proximal left femur appears increased in size. SUV max is equal to 5.75. Previously 6.3. Lytic lesion involving the greater trochanter measures 1.3 cm and has an SUV max of 4.6. Previously this measured 0.8 cm within SUV max of 4.9. Of potential orthopedic significance is a progressive cortical lesion involving the right femoral neck which measures 1.8 cm and has an SUV max of 4.3. Previously 0.7 cm with SUV max of 3.9. Lytic lesion involving the superior endplate of L2 measures 1.4 cm without significant FDG uptake above background activity. On the previous exam this measured 0.5 cm. Also in the L2 vertebra is a 9 mm lytic lesion without significant uptake above background activity. This lesion was barely visible on previous exam. Lytic lesion involving the posterior aspect of the left eleventh rib measures 9 mm with SUV max of 5.76. Previously 6 mm with SUV max of 5.02. Multifocal hypermetabolic intramuscular metastases. Left gluteal soft tissue metastasis has an SUV max of 5.34. Previously 3.84. Left paraspinous muscle metastasis has an SUV max of 4 point 1 4. New from previous exam. Right gluteal metastasis has an  SUV max of 3.19. Previously 2.09 Incidental CT findings: none IMPRESSION: 1. Overall interval progression of disease. 2. Increase in size of left upper lobe lung mass with involvement of the left hilum and mediastinum. There is new postobstructive pneumonitis within the posterior left upper lobe. 3. 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 Compared with previous exam most of these lesions have increased in size in the interval. Variable FDG uptake associated with these metastases are noted, similar to previous study. A few lesions which are larger exhibit decreased FDG uptake from previous study. Other enlarging lytic lesions exhibit increased uptake. Progressive lytic lesion involving the right femoral neck may be of orthopedic significance. 4. Skeletal muscle metastasis appear slightly progressive from previous exam. 5. New hypermetabolic nodule is identified within the left upper quadrant of the abdomen abutting the undersurface of the left hemidiaphragm. Suspicious for metastasis. 6. New left pleural effusion and new bilateral lower lobe airspace consolidation, left greater than right. Findings are favored to represent sequelae of pneumonia and/or aspiration. 7. Aortic Atherosclerosis (ICD10-I70.0) and Emphysema (ICD10-J43.9). 8. Coronary artery calcifications Electronically Signed   By: Kerby Moors M.D.   On: 05/05/2018 00:50    EKG: Independently reviewed.  Old RBBB.  QTC 484.  EKG unchanged from prior.  Assessment/Plan Active Problems:   Pneumonia   Post obstructive pneumonia-dyspnea, tachypnea, generalized weakness.  CT findings shows progression of pneumonia.  Likely bacterial etiology.  WBC 28, likely reaction from recent Neupogen therapy for initial neutropenia.  Lactic acid.  Minimal new O2 requirements of 2 L.  Recent influenza panel negative. She is low risk for Co-Vid 19.   - D/c IV Vanc and cefepime started in ED -Considering respiratory symptoms and immunocompromise status  will rule out Co-vid. -Droplet and contact precautions -Will start IV Zosyn to add anaerobic coverage in the setting of postobstructive pneumonia. -BMP CBC a.m. - 541m bolus given in ED, cont N/s +40kcl 100cc/hr x 15hrs  Metastatic lung adenocarcinoma - Diagnosed 11/2017.  Mets to brain and bone.  Follows with Dr. KDelton Coombes  Patient completed sixth course of chemotherapy.  PET scan Today shows overall  interval progression of disease.  Appointment with her oncology 05/12/2018. -May benefit from initiation of palliative care, if possible during hospitalization.  Hypertension-blood pressure systolic 224M to 250I.  Chronically elevated. -Increase home Norvasc to 10 mg daily -Resume home lisinopril -PRN labetalol   DVT prophylaxis: Lovenox Code Status: DNR-discussed with patient at bedside. Family Communication:None at bedside Disposition Plan: Per rounding team Consults called: None Admission status: Obs, Med-surg.   Bethena Roys MD [facility]  05/05/2018, 5:54 PM

## 2018-05-06 ENCOUNTER — Other Ambulatory Visit: Payer: Self-pay

## 2018-05-06 DIAGNOSIS — Z87891 Personal history of nicotine dependence: Secondary | ICD-10-CM | POA: Diagnosis not present

## 2018-05-06 DIAGNOSIS — R0902 Hypoxemia: Secondary | ICD-10-CM | POA: Diagnosis present

## 2018-05-06 DIAGNOSIS — D701 Agranulocytosis secondary to cancer chemotherapy: Secondary | ICD-10-CM | POA: Diagnosis present

## 2018-05-06 DIAGNOSIS — Z66 Do not resuscitate: Secondary | ICD-10-CM | POA: Diagnosis present

## 2018-05-06 DIAGNOSIS — C3412 Malignant neoplasm of upper lobe, left bronchus or lung: Secondary | ICD-10-CM | POA: Diagnosis present

## 2018-05-06 DIAGNOSIS — E785 Hyperlipidemia, unspecified: Secondary | ICD-10-CM | POA: Diagnosis present

## 2018-05-06 DIAGNOSIS — Z885 Allergy status to narcotic agent status: Secondary | ICD-10-CM | POA: Diagnosis not present

## 2018-05-06 DIAGNOSIS — Z20828 Contact with and (suspected) exposure to other viral communicable diseases: Secondary | ICD-10-CM | POA: Diagnosis present

## 2018-05-06 DIAGNOSIS — J189 Pneumonia, unspecified organism: Secondary | ICD-10-CM | POA: Diagnosis present

## 2018-05-06 DIAGNOSIS — R0602 Shortness of breath: Secondary | ICD-10-CM | POA: Diagnosis present

## 2018-05-06 DIAGNOSIS — Z8249 Family history of ischemic heart disease and other diseases of the circulatory system: Secondary | ICD-10-CM | POA: Diagnosis not present

## 2018-05-06 DIAGNOSIS — I10 Essential (primary) hypertension: Secondary | ICD-10-CM | POA: Diagnosis present

## 2018-05-06 DIAGNOSIS — Z7982 Long term (current) use of aspirin: Secondary | ICD-10-CM | POA: Diagnosis not present

## 2018-05-06 DIAGNOSIS — C7951 Secondary malignant neoplasm of bone: Secondary | ICD-10-CM | POA: Diagnosis present

## 2018-05-06 DIAGNOSIS — Y95 Nosocomial condition: Secondary | ICD-10-CM | POA: Diagnosis present

## 2018-05-06 DIAGNOSIS — C7931 Secondary malignant neoplasm of brain: Secondary | ICD-10-CM | POA: Diagnosis present

## 2018-05-06 DIAGNOSIS — T451X5A Adverse effect of antineoplastic and immunosuppressive drugs, initial encounter: Secondary | ICD-10-CM | POA: Diagnosis present

## 2018-05-06 DIAGNOSIS — J181 Lobar pneumonia, unspecified organism: Secondary | ICD-10-CM | POA: Diagnosis not present

## 2018-05-06 DIAGNOSIS — Z79899 Other long term (current) drug therapy: Secondary | ICD-10-CM | POA: Diagnosis not present

## 2018-05-06 DIAGNOSIS — I4510 Unspecified right bundle-branch block: Secondary | ICD-10-CM | POA: Diagnosis present

## 2018-05-06 LAB — BASIC METABOLIC PANEL
Anion gap: 11 (ref 5–15)
BUN: 10 mg/dL (ref 8–23)
CO2: 19 mmol/L — ABNORMAL LOW (ref 22–32)
Calcium: 7.7 mg/dL — ABNORMAL LOW (ref 8.9–10.3)
Chloride: 107 mmol/L (ref 98–111)
Creatinine, Ser: 0.47 mg/dL (ref 0.44–1.00)
GFR calc Af Amer: >60 mL/min (ref >60)
Glucose, Bld: 89 mg/dL (ref 70–99)
POTASSIUM: 3.5 mmol/L (ref 3.5–5.1)
Sodium: 137 mmol/L (ref 135–145)

## 2018-05-06 LAB — CBC
HCT: 30 % — ABNORMAL LOW (ref 36.0–46.0)
Hemoglobin: 8.1 g/dL — ABNORMAL LOW (ref 12.0–15.0)
MCH: 23.5 pg — ABNORMAL LOW (ref 26.0–34.0)
MCHC: 27 g/dL — ABNORMAL LOW (ref 30.0–36.0)
MCV: 87 fL (ref 80.0–100.0)
Platelets: 141 10*3/uL — ABNORMAL LOW (ref 150–400)
RBC: 3.45 MIL/uL — ABNORMAL LOW (ref 3.87–5.11)
RDW: 16.6 % — ABNORMAL HIGH (ref 11.5–15.5)
WBC: 27.7 10*3/uL — ABNORMAL HIGH (ref 4.0–10.5)
nRBC: 0.6 % — ABNORMAL HIGH (ref 0.0–0.2)

## 2018-05-06 MED ORDER — ENSURE ENLIVE PO LIQD
237.0000 mL | Freq: Two times a day (BID) | ORAL | Status: DC
  Administered 2018-05-06 – 2018-05-07 (×3): 237 mL via ORAL

## 2018-05-06 MED ORDER — SODIUM CHLORIDE 0.9% FLUSH: 10.0000 mL | INTRAVENOUS | Status: DC | PRN

## 2018-05-06 MED ORDER — ADULT MULTIVITAMIN W/MINERALS CH
1.0000 | ORAL_TABLET | Freq: Every day | ORAL | Status: DC
  Administered 2018-05-07 – 2018-05-08 (×2): 1 via ORAL
  Filled 2018-05-06 (×2): qty 1

## 2018-05-06 NOTE — Consult Note (Signed)
[facility] Nurse wound consult note Remote consult performed with bedside RN in room and [place] nurse outside room due to airborne precautions and conservation of PPE.  Reason for Consult:stage 2 sacrum, present on admission, recent hospitalization and prolonged illness.  Wound type:parital thickness pressure injury Pressure Injury POA: Yes Measurement: not assesed Wound ECX:FQHK and moist Drainage (amount, consistency, odor) scant weeping Periwound:intact Dressing procedure/placement/frequency: Cleanse sacral wound with soap and water and pat dry. Apply silicone border foam dressing.  Encourage to turn and reposition every two hours.  Will not follow at this time.  Please re-consult if needed.  Domenic Moras MSN, RN, FNP-BC [facility]ON Wound, Ostomy, Continence Nurse Pager [phone]

## 2018-05-06 NOTE — Progress Notes (Signed)
Initial Nutrition Assessment  RD working remotely.   DOCUMENTATION CODES:   Underweight  INTERVENTION:  - Will order Ensure Enlive BID, each supplement provides 350 kcal and 20 grams of protein. - Will order Magic Cup BID with meals, each supplement provides 290 kcal and 9 grams of protein. - Will order daily multivitamin with minerals. - Continue to encourage PO intakes.    NUTRITION DIAGNOSIS:   Increased nutrient needs related to acute illness, chronic illness, catabolic illness, cancer and cancer related treatments as evidenced by estimated needs.  GOAL:   Patient will meet greater than or equal to 90% of their needs  MONITOR:   PO intake, Supplement acceptance, Weight trends, Labs, I & O's  REASON FOR ASSESSMENT:   Malnutrition Screening Tool  ASSESSMENT:   78 year old female with history of stage IV lung cancer with mets to brain and bone, HTN, dyslipidemia, chronic anemia, recently hospitalized from 3/27-3/28 for post structures PNA. She presented to the ED with cough, SOB, and weakness persistent and ongoing for 2 weeks. Next line CTA chest was negative for PE but showed large L hilar mass, L mediastinum, extensive post-obstructive changes throughout L lung compatible with multilobar pneumonia and atelectasis, also metastases present on ribs.  Review of RN flow sheet indicates no intakes documented since admission. Patient being followed by a RD through [facility] and was also seen by that RD when she was hospitalized a few days ago (note on 3/28).   Per chart review, current weight is 109 lb and weight on 04/21/18 was 118 lb. This indicates 9 lb weight loss (7.6% body weight) in the past 2 weeks; significant for time frame. Highly suspect patient meets criteria for severe malnutrition but unable to confirm at this time d/t inability to perform NFPE.   Per Dr. Delene Loll note this AM: L lung PNA, s/p COVID-19 testing in the ED--pending results, very  debilitated in the setting of metastatic cancer with oncology follow-up scheduled 4/7, DNR status. Note states disposition plan of "home tomorrow if stable."   Medications reviewed. Labs reviewed; Ca: 7.7 mg/dl.    NUTRITION - FOCUSED PHYSICAL EXAM:  Unable to complete at this time.   Diet Order:   Diet Order            Diet Heart Room service appropriate? Yes; Fluid consistency: Thin  Diet effective now              EDUCATION NEEDS:   Not appropriate for education at this time  Skin:  Skin Assessment: Skin Integrity Issues: Skin Integrity Issues:: Stage II, Stage III Stage II: sacrum Stage III: sacrum, R buttocks  Last BM:  PTA/unknown  Height:   Ht Readings from Last 1 Encounters:  05/05/18 5\' 10"  (1.778 m)    Weight:   Wt Readings from Last 1 Encounters:  05/05/18 49.7 kg    Ideal Body Weight:  68.18 kg  BMI:  Body mass index is 15.72 kg/m.  Estimated Nutritional Needs:   Kcal:  7622-6333 kcal  Protein:  100-115 grams  Fluid:  >/= 2 L/day     Jarome Matin, MS, RD, LDN, [facility] Inpatient Clinical Dietitian Pager # [phone] After hours/weekend pager # [phone]

## 2018-05-06 NOTE — Progress Notes (Deleted)
CRITICAL VALUE ALERT  Critical Value: COVID positive  Date & Time Notied:  05/06/18 0400  Provider Notified: AC and ID made aware  Orders Received/Actions taken: pending

## 2018-05-06 NOTE — Progress Notes (Signed)
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 The emergency room patient was mildly hypoxic requiring 2 L nasal cannula, afebrile hypertensive with significant cytosis of 28,000. Next line CTA chest was negative for PE but showed large left hilar mass, left mediastinum, extensive postobstructive changes throughout left lung compatible with multilobar pneumonia and atelectasis, also metastasis present on ribs.  Assessment & Plan:  Left lung pneumonia -Most likely postobstructive, however given her immunocompromised state, ongoing cough/shortness of breath and current pandemic she was tested for Covid 19 in the ED yesterday -Results pending at this time -Currently on IV Zosyn day 2 we'll continue this -Continue IV fluids -weaned off O2 -Discharge planning -Currently very debilitated in the setting of metastatic cancer and chemotherapy, she is DO NOT RESUSCITATE  Widely metastatic adenocarcinoma lungs -Diagnosed in October of/2019, with metastasis to bone, brain, history of SRS in February, currently on chemotherapy follows at [facility] -Discussed with oncologist, patient had a palliative consult recently and [facility] and declined palliative/hospice services, this will likely need to be brought up sooner, she has an upcoming appointment with her oncologist on 4/7  Hypertension -continue Norvasc and lisinopril  Profound leukocytosis -white count 28,000, I suspect this is secondary  to recent Neupogen therapy for neutropenia from chemotherapy   DVT prophylaxis:Lovenox Code Status: DO NOT RESUSCITATE Family Communication:no family at bedside Disposition Plan: home tomorrow if stable  Consultants:      Procedures:   Antimicrobials:    Subjective: - feels a little better today, still some cough and shortness of breath reported  Objective: Vitals:   05/05/18 2025 05/05/18 2128 05/05/18 2340 05/06/18 0534  BP: (!) 189/76 (!) 180/71 (!) 181/70 (!) 155/59  Pulse: 89 76 72 62  Resp: 17 14  20   Temp:  97.7 F (36.5 C)  (!) 97.5 F (36.4 C)  TempSrc:  Oral  Oral  SpO2: 94% 97%  100%  Weight:      Height:        Intake/Output Summary (Last 24 hours) at 05/06/2018 1126 Last data filed at 05/06/2018 0644 Gross per 24 hour  Intake 716.74 ml  Output --  Net 716.74 ml   Filed Weights   05/05/18 1251  Weight: 49.7 kg    Examination:  Gen: Awake, Alert, Oriented X 3, no distress HEENT: PERRLA, Neck supple, no JVD Lungs:good air movement, scattered rhonchi CVS: RRR,No Gallops,Rubs or new Murmurs Abd: soft, Non tender, non distended, BS present Extremities: no edema Skin: no new rashes Psychiatry: Judgement and insight appear normal. Mood & affect appropriate.     Data Reviewed:   CBC: Recent Labs  Lab 05/01/18 2022 05/02/18 0329 05/03/18 0423 05/05/18 1316 05/06/18 0358  WBC 0.8* 1.2* 5.2 28.4* 27.7*  NEUTROABS 0.2* 0.3* 2.8 18.0*  --   HGB 9.7* 8.0* 8.1* 8.7* 8.1*  HCT 33.7* 27.9* 27.3* 29.5* 30.0*  MCV 81.0 80.4 79.6* 80.2 87.0  PLT 55* 44* 37* 126* 829*   Basic Metabolic Panel: Recent Labs  Lab 05/01/18 2022 05/02/18 0328 05/02/18 0329 05/03/18 0423 05/05/18 1316  05/06/18 0358  NA 136  --  138 137 135 137  K 3.1*  --  2.9* 3.9 3.3* 3.5  CL 100  --  107 108 102 107  CO2 24  --  24 22 22  19*  GLUCOSE 147*  --  120* 118* 84 89  BUN 14  --  12 14 12 10   CREATININE 0.60  --  0.43* 0.44 0.52 0.47  CALCIUM 8.4*  --  7.7* 8.1*  8.3* 7.7*  MG  --   --  1.3* 1.6* 1.2*  --   PHOS  --  1.2*  --   --   --   --    GFR: Estimated Creatinine Clearance: 46.9 mL/min (by C-G formula based on SCr of 0.47 mg/dL). Liver Function Tests: Recent Labs  Lab 05/01/18 2022 05/05/18 1316  AST 29 27  ALT 17 14  ALKPHOS 67 90  BILITOT 0.8 1.2  PROT 7.4 6.9  ALBUMIN 2.7* 2.6*   No results for input(s): LIPASE, AMYLASE in the last 168 hours. No results for input(s): AMMONIA in the last 168 hours. Coagulation Profile: No results for input(s): INR, PROTIME in the last 168 hours. Cardiac Enzymes: Recent Labs  Lab 05/05/18 1316  TROPONINI <0.03   BNP (last 3 results) No results for input(s): PROBNP in the last 8760 hours. HbA1C: No results for input(s): HGBA1C in the last 72 hours. CBG: No results for input(s): GLUCAP in the last 168 hours. Lipid Profile: No results for input(s): CHOL, HDL, LDLCALC, TRIG, CHOLHDL, LDLDIRECT in the last 72 hours. Thyroid Function Tests: No results for input(s): TSH, T4TOTAL, FREET4, T3FREE, THYROIDAB in the last 72 hours. Anemia Panel: No results for input(s): VITAMINB12, FOLATE, FERRITIN, TIBC, IRON, RETICCTPCT in the last 72 hours. Urine analysis:    Component Value Date/Time   COLORURINE AMBER (A) 05/05/2018 1320   APPEARANCEUR CLEAR 05/05/2018 1320   LABSPEC 1.023 05/05/2018 1320   PHURINE 7.0 05/05/2018 1320   GLUCOSEU NEGATIVE 05/05/2018 1320   HGBUR NEGATIVE 05/05/2018 1320   BILIRUBINUR NEGATIVE 05/05/2018 1320   KETONESUR 5 (A) 05/05/2018 1320   PROTEINUR NEGATIVE 05/05/2018 1320   NITRITE NEGATIVE 05/05/2018 1320   LEUKOCYTESUR NEGATIVE 05/05/2018 1320   Sepsis Labs: @LABRCNTIP (procalcitonin:4,lacticidven:4)  ) Recent Results (from the past 240 hour(s))  Culture, blood (routine x 2) Call MD if unable to obtain prior to antibiotics being given     Status: None (Preliminary result)   Collection Time: 05/02/18  3:28 AM  Result Value Ref Range Status   Specimen  Description RIGHT ANTECUBITAL  Final   Special Requests   Final    BOTTLES DRAWN AEROBIC AND ANAEROBIC Blood Culture adequate volume   Culture   Final    NO GROWTH 4 DAYS Performed at [facility], [address], [place], [place] [address]    Report Status PENDING  Incomplete  Culture, blood (routine x 2) Call MD if unable to obtain prior to antibiotics being given     Status: None (Preliminary result)   Collection Time: 05/02/18  3:34 AM  Result Value Ref Range Status   Specimen Description BLOOD RIGHT HAND  Final   Special Requests   Final    BOTTLES DRAWN AEROBIC AND ANAEROBIC Blood Culture adequate volume   Culture   Final    NO GROWTH 4 DAYS Performed at [facility], [address] [place], [place] [address]    Report Status PENDING  Incomplete  MRSA PCR Screening     Status:  None   Collection Time: 05/02/18  6:26 AM  Result Value Ref Range Status   MRSA by PCR NEGATIVE NEGATIVE Final    Comment:        The GeneXpert MRSA Assay (FDA approved for NASAL specimens only), is one component of a comprehensive MRSA colonization surveillance program. It is not intended to diagnose MRSA infection nor to guide or monitor treatment for MRSA infections. Performed at [facility], [address], [place], [place] [address]   Culture, blood (routine x 2)     Status: None (Preliminary result)   Collection Time: 05/05/18  1:37 PM  Result Value Ref Range Status   Specimen Description BLOOD RIGHT HAND  Final   Special Requests   Final    BOTTLES DRAWN AEROBIC ONLY Blood Culture adequate volume   Culture   Final    NO GROWTH < 24 HOURS Performed at [facility], [address] [place], [place] [address]    Report Status PENDING  Incomplete  Culture, blood (routine x 2)     Status: None (Preliminary result)   Collection Time: 05/05/18  1:37 PM  Result Value Ref Range Status   Specimen Description LEFT ANTECUBITAL  Final   Special Requests   Final    BOTTLES DRAWN AEROBIC ONLY  Blood Culture adequate volume   Culture   Final    NO GROWTH < 24 HOURS Performed at [facility], [address] [place], [place] [address]    Report Status PENDING  Incomplete         Radiology Studies: Ct Angio Chest Pe W And/or Wo Contrast  Result Date: 05/05/2018 CLINICAL DATA:  78 year old female with history of lung cancer and recent history of pneumonia. Persistent weakness and shortness of breath. EXAM: CT ANGIOGRAPHY CHEST WITH CONTRAST TECHNIQUE: Multidetector CT imaging of the chest was performed using the standard protocol during bolus administration of intravenous contrast. Multiplanar CT image reconstructions and MIPs were obtained to evaluate the vascular anatomy. CONTRAST:  49mL OMNIPAQUE IOHEXOL 350 MG/ML SOLN COMPARISON:  Chest CT 05/01/2018.  PET-CT 05/04/2018. FINDINGS: Cardiovascular: No filling defects within the pulmonary arterial tree to suggest underlying pulmonary embolism. Heart size is borderline enlarged. There is no significant pericardial fluid, thickening or pericardial calcification. There is aortic atherosclerosis, as well as atherosclerosis of the great vessels of the mediastinum and the coronary arteries, including calcified atherosclerotic plaque in the left anterior descending coronary artery. Thickening calcification of the aortic valve. Right internal jugular single-lumen porta cath with tip terminating in the right atrium. Mediastinum/Nodes: Bulky nodal mass involving portions of the left hilar region as well as the the left mediastinum, overall measuring approximately 7.8 x 5.2 cm. Esophagus is unremarkable in appearance. No axillary lymphadenopathy. Lungs/Pleura: Persistent airspace consolidation in the left upper lobe, most evident in the posterior aspect of the left upper lobe, most compatible with postobstructive pneumonia. Extensive bronchial obstruction throughout the left upper lobe and left lower lobe related to mucoid impaction. Minimal dependent  consolidation, and extensive atelectasis in the basal segments of the left lower lobe. Dependent subsegmental atelectasis in the right lower lobe. Trace right pleural effusion. Small left pleural effusion lying dependently. Diffuse bronchial wall thickening with moderate centrilobular and paraseptal emphysema. Upper Abdomen: Unremarkable. Musculoskeletal: Expansile lytic lesion in the lateral aspect of the right fourth rib (axial image 65 of series 6), and expansile lytic lesion in the posterior aspect of the left eleventh rib (axial image 122 of series 6), similar to prior studies. Review of  the MIP images confirms the above findings. IMPRESSION: 1. No evidence of pulmonary embolism. 2. Large mass centered in the left hilar region and left mediastinum currently measuring approximately 5.2 x 7.8 cm, as above. This is associated with extensive postobstructive changes throughout the left lung, compatible with a combination of multilobar pneumonia and atelectasis. Small left pleural effusion lying dependently. 3. Trace right pleural effusion lying dependently with some associated passive subsegmental atelectasis in the right lower lobe. 4. Metastatic lesions in the lateral right fourth rib and posterior left eleventh rib again noted. 5. Diffuse bronchial wall thickening with moderate centrilobular and paraseptal emphysema. 6. There are calcifications of the aortic valve. Echocardiographic correlation for evaluation of potential valvular dysfunction may be warranted if clinically indicated. 7. Aortic atherosclerosis, in addition to left anterior descending coronary artery disease. Assessment for potential risk factor modification, dietary therapy or pharmacologic therapy may be warranted, if clinically indicated. Aortic Atherosclerosis (ICD10-I70.0) and Emphysema (ICD10-J43.9). Electronically Signed   By: Vinnie Langton M.D.   On: 05/05/2018 15:44   Nm Pet Image Restag (ps) Skull Base To Thigh  Result Date:  05/05/2018 CLINICAL DATA:  Subsequent treatment strategy for non-small cell lung cancer. EXAM: NUCLEAR MEDICINE PET SKULL BASE TO THIGH TECHNIQUE: 7.76 mCi F-18 FDG was injected intravenously. Full-ring PET imaging was performed from the skull base to thigh after the radiotracer. CT data was obtained and used for attenuation correction and anatomic localization. Fasting blood glucose: 86 mg/dl COMPARISON:  03/12/2018 FINDINGS: Mediastinal blood pool activity: SUV max 2.08 NECK: Previous right lobe of thyroid gland nodule has an SUV max of 2.8. Previously 4.4. No hypermetabolic lymph nodes within the soft tissues of the neck. Incidental CT findings: none CHEST: The left upper lobe paramediastinal lung mass which invades the AP window region measures 7.4 cm on today's exam and has an SUV max of free 8.32. Previously this measured 5.5 cm within SUV max 11.12. Hypermetabolic left hilar lymph node measures 1.8 cm and has an SUV max of 7.1. Previously 1.9 cm within SUV max of 9.9. Increase in postobstructive changes within the posterior left upper lobe, image 83/3. There is a new small to moderate left pleural effusion. Dense airspace consolidation within the left lower lobe is identified with corresponding increased uptake within SUV max of 7.6. New subsegmental atelectasis within the posterior right base. Incidental CT findings: Centrilobular emphysema. Aortic atherosclerosis. Lad coronary artery calcifications. ABDOMEN/PELVIS: Within the left upper quadrant of the abdomen there is a new hypermetabolic nodule along the undersurface of the left hemidiaphragm anteriorly which measures approximately 1.3 cm and has an SUV max of 6.65. No abnormal uptake identified within the liver, pancreas. Similar mild asymmetric hypermetabolism within the left adrenal gland. Diffuse increased uptake identified throughout the liver which is favored to represent. Post treatment changes. No hypermetabolic abdominopelvic adenopathy.  Incidental CT findings: none SKELETON: Hypermetabolic lytic lesion involving the right iliac bone is again noted measuring 1.5 cm within SUV max of 12.8. Hypermetabolic right fourth rib metastases appears lytic. This measures 2 cm within SUV max of 4.9. Previously this measured 1 cm with SUV max of 4.68. New hypermetabolic lesion within the right sacrum has an SUV max of 6.04. This measures approximately 9 mm, image 240/3. The hypermetabolic lesion involving the proximal left femur appears increased in size. SUV max is equal to 5.75. Previously 6.3. Lytic lesion involving the greater trochanter measures 1.3 cm and has an SUV max of 4.6. Previously this measured 0.8 cm within SUV max of  4.9. Of potential orthopedic significance is a progressive cortical lesion involving the right femoral neck which measures 1.8 cm and has an SUV max of 4.3. Previously 0.7 cm with SUV max of 3.9. Lytic lesion involving the superior endplate of L2 measures 1.4 cm without significant FDG uptake above background activity. On the previous exam this measured 0.5 cm. Also in the L2 vertebra is a 9 mm lytic lesion without significant uptake above background activity. This lesion was barely visible on previous exam. Lytic lesion involving the posterior aspect of the left eleventh rib measures 9 mm with SUV max of 5.76. Previously 6 mm with SUV max of 5.02. Multifocal hypermetabolic intramuscular metastases. Left gluteal soft tissue metastasis has an SUV max of 5.34. Previously 3.84. Left paraspinous muscle metastasis has an SUV max of 4 point 1 4. New from previous exam. Right gluteal metastasis has an SUV max of 3.19. Previously 2.09 Incidental CT findings: none IMPRESSION: 1. Overall interval progression of disease. 2. Increase in size of left upper lobe lung mass with involvement of the left hilum and mediastinum. There is new postobstructive pneumonitis within the posterior left upper lobe. 3. Multifocal lytic bone metastases are again  noted. Compared with previous exam most of these lesions have increased in size in the interval. Variable FDG uptake associated with these metastases are noted, similar to previous study. A few lesions which are larger exhibit decreased FDG uptake from previous study. Other enlarging lytic lesions exhibit increased uptake. Progressive lytic lesion involving the right femoral neck may be of orthopedic significance. 4. Skeletal muscle metastasis appear slightly progressive from previous exam. 5. New hypermetabolic nodule is identified within the left upper quadrant of the abdomen abutting the undersurface of the left hemidiaphragm. Suspicious for metastasis. 6. New left pleural effusion and new bilateral lower lobe airspace consolidation, left greater than right. Findings are favored to represent sequelae of pneumonia and/or aspiration. 7. Aortic Atherosclerosis (ICD10-I70.0) and Emphysema (ICD10-J43.9). 8. Coronary artery calcifications Electronically Signed   By: Kerby Moors M.D.   On: 05/05/2018 00:50        Scheduled Meds:  amLODipine  10 mg Oral Daily   enoxaparin (LOVENOX) injection  40 mg Subcutaneous Q24H   lisinopril  5 mg Oral Daily   Continuous Infusions:  0.9 % NaCl with KCl 40 mEq / L 100 mL/hr (05/05/18 2351)   piperacillin-tazobactam (ZOSYN)  IV 3.375 g (05/06/18 0554)     LOS: 0 days    Time spent: 1min  Domenic Polite, MD [facility]  05/06/2018, 11:26 AM

## 2018-05-07 DIAGNOSIS — C7951 Secondary malignant neoplasm of bone: Secondary | ICD-10-CM

## 2018-05-07 DIAGNOSIS — C7931 Secondary malignant neoplasm of brain: Secondary | ICD-10-CM

## 2018-05-07 LAB — CULTURE, BLOOD (ROUTINE X 2)
Culture: NO GROWTH
Culture: NO GROWTH
Special Requests: ADEQUATE
Special Requests: ADEQUATE

## 2018-05-07 LAB — CBC
HCT: 30 % — ABNORMAL LOW (ref 36.0–46.0)
Hemoglobin: 8.8 g/dL — ABNORMAL LOW (ref 12.0–15.0)
MCH: 24.1 pg — ABNORMAL LOW (ref 26.0–34.0)
MCHC: 29.3 g/dL — ABNORMAL LOW (ref 30.0–36.0)
MCV: 82.2 fL (ref 80.0–100.0)
Platelets: 219 10*3/uL (ref 150–400)
RBC: 3.65 MIL/uL — ABNORMAL LOW (ref 3.87–5.11)
RDW: 16.8 % — ABNORMAL HIGH (ref 11.5–15.5)
WBC: 32.9 10*3/uL — ABNORMAL HIGH (ref 4.0–10.5)
nRBC: 0.5 % — ABNORMAL HIGH (ref 0.0–0.2)

## 2018-05-07 MED ORDER — ALTEPLASE 2 MG IJ SOLR
2.0000 mg | Freq: Once | INTRAMUSCULAR | Status: DC
  Filled 2018-05-07: qty 2

## 2018-05-07 MED ORDER — AMOXICILLIN-POT CLAVULANATE 875-125 MG PO TABS
1.0000 | ORAL_TABLET | Freq: Two times a day (BID) | ORAL | Status: DC
  Administered 2018-05-07 – 2018-05-08 (×3): 1 via ORAL
  Filled 2018-05-07 (×3): qty 1

## 2018-05-07 NOTE — Discharge Summary (Addendum)
Physician Discharge Summary  Melinda Larsen UDJ:[id number] DOB: January 12, 1941 DOA: 05/05/2018  PCP: Redmond School, MD  Admit date: 05/05/2018 Discharge date: 05/08/2018  Time spent: 35 minutes  Recommendations for Outpatient Follow-up:  1. SNF for Rehabilitation 2. 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    Non-small cell lung cancer (NSCLC) ([place])   HCAP (healthcare-associated pneumonia)   Discharge Condition: stable, long term prognosis is poor  Diet recommendation: regular  Filed Weights   05/05/18 1251  Weight: 49.7 kg    History of present illness:  Melinda Larsen is a 78 year old female with history of stage IV lung cancer with metastasis to brain and bone, hypertension, dyslipidemia, chronic anemia, recently hospitalized from 3/27-3/28 for post structures pneumonia, treated with broad-spectrum biotics the hospital and discharged home on Augmentin, presented to the emergency room with cough, shortness of breath and weakness persistent and ongoing for 2 weeks. The emergency room patient was mildly hypoxic requiring 2 L nasal cannula, afebrile hypertensive with significant cytosis of 28,000. Next line CTA chest was negative for PE but showed large left hilar mass, left mediastinum, extensive postobstructive changes throughout left lung compatible with multilobar pneumonia and atelectasis, also metastasis present on ribs.  Hospital Course:   Left lung pneumonia -Most likely postobstructive, however given her immunocompromised state, ongoing cough/shortness of breath and current pandemic she was tested for Covid 19 in the ED 3/31 -Covid 19 PCR test was completed and was negative -treated with IV Zosyn, changed to Po augmentin to complete 7day course  of Abx -weaned off O2 -clinically improved and discharged to SNF for rehab due to overall debility related to advanced cancer/chemotherapy -she is DO NOT RESUSCITATE  Widely metastatic adenocarcinoma lungs -Diagnosed in October of/2019, with metastasis to bone, brain, history of SRS in February, was getting chemotherapy follows at [facility] -Discussed with oncologist Dr.Katragadda, patient had a palliative consult recently and [facility] and declined palliative/hospice services, this will likely need to be brought up soon again she has an upcoming appointment with her oncologist on 4/7  Hypertension -continue Norvasc and lisinopril  Profound leukocytosis -white count 28-32K,  I suspect this is secondary to recent Neupogen therapy for neutropenia from chemotherapy  Code Status: DO NOT RESUSCITATE  Discharge Exam: Vitals:   05/07/18 2116 05/08/18 0616  BP: (!) 164/63 (!) 145/58  Pulse: 75 80  Resp:  20  Temp: 98.1 F (36.7 C) 98.2 F (36.8 C)  SpO2: 100% 96%    General: AAOx3 Cardiovascular: S1S2/RRR Respiratory: CTAB  Discharge Instructions   Discharge Instructions    Diet - low sodium heart healthy   Complete by:  As directed    Increase activity slowly   Complete by:  As directed      Allergies as of 05/08/2018      Reactions   Codeine Nausea Only, Other (See Comments)   Nervous/sweating      Medication List    STOP taking these medications   doxycycline 100 MG capsule Commonly known as:  MONODOX     TAKE these medications   Accu-Chek Guide test strip Generic drug:  glucose blood   Accu-Chek Guide w/Device Kit   albuterol 108 (90 Base) MCG/ACT inhaler Commonly known as:  PROVENTIL HFA;VENTOLIN HFA  Inhale 2 puffs into the lungs every 4 (four) hours as needed for wheezing or shortness of breath (or coughing).   amLODipine 5 MG tablet Commonly known as:  NORVASC Take 5 mg by mouth every evening.   amoxicillin-clavulanate  875-125 MG tablet Commonly known as:  Augmentin Take 1 tablet by mouth 2 (two) times daily for 4 days.   aspirin EC 81 MG tablet Take 81 mg by mouth every evening.   benzonatate 100 MG capsule Commonly known as:  Tessalon Perles Take 1 capsule (100 mg total) by mouth every 6 (six) hours as needed for cough.   dexamethasone 4 MG tablet Commonly known as:  DECADRON Take 1 tab two times a day the day before Alimta chemo, then take 2 tabs once a day for 3 days starting the day after chemo.   feeding supplement (ENSURE ENLIVE) Liqd Take 237 mLs by mouth 3 (three) times daily between meals.   folic acid 1 MG tablet Commonly known as:  FOLVITE Take 1 tablet (1 mg total) by mouth daily. Start 5-7 days before Alimta chemotherapy. Continue until 21 days after Alimta completed.   HYDROcodone-acetaminophen 5-325 MG tablet Commonly known as:  NORCO/VICODIN Take 1 tablet by mouth every 6 (six) hours as needed for moderate pain. 1 or 2 tabs PO q4 hours prn pain What changed:  when to take this   Ipratropium-Albuterol 20-100 MCG/ACT Aers respimat Commonly known as:  COMBIVENT Inhale 1 puff into the lungs every 6 (six) hours as needed for up to 30 days for wheezing or shortness of breath.   lisinopril 5 MG tablet Commonly known as:  PRINIVIL,ZESTRIL Take 5 mg by mouth every evening.   mirtazapine 15 MG tablet Commonly known as:  Remeron Take 1 tablet (15 mg total) by mouth at bedtime.   simvastatin 40 MG tablet Commonly known as:  ZOCOR Take 40 mg by mouth every evening.      Allergies  Allergen Reactions  . Codeine Nausea Only and Other (See Comments)    Nervous/sweating      The results of significant diagnostics from this hospitalization (including imaging, microbiology, ancillary and laboratory) are listed below for reference.    Significant Diagnostic Studies: Ct Chest Wo Contrast  Result Date: 05/01/2018 CLINICAL DATA:  Dyspnea, chronic, neg or nondiagnostic xray. Lung  cancer, last chemotherapy last week. EXAM: CT CHEST WITHOUT CONTRAST TECHNIQUE: Multidetector CT imaging of the chest was performed following the standard protocol without IV contrast. COMPARISON:  Chest radiograph earlier this day.  PET-CT 03/12/2018 FINDINGS: Cardiovascular: Right chest port with tip in the lower SVC. Atherosclerosis of the thoracic aorta. Vascular structures not well assessed in the absence of IV contrast. Cardiomegaly. Trace pericardial effusion. Mediastinum/Nodes: Left suprahilar mass contiguous with the mediastinum. This appears subjectively larger than on prior exam measuring 5.6 x 4.0 cm, previously 4.5 x 3.9 cm. This is contiguous with left AP window and periaortic adenopathy, extent not well-defined in the absence of IV contrast. The esophagus is decompressed. Lungs/Pleura: Breathing motion artifact limits assessment. Emphysema. Left suprahilar mass contiguous with the mediastinum, subject ovary larger measuring 5.6 x 4.0 cm, previously 4.5 x 3.9 cm. Nodule anteriorly measuring 1.2 x 1.0 cm is unchanged. There is progressive bronchial filling in the left lung with bronchial filling/mucous plugging and associated patchy and ground-glass opacities in the left upper and lower lobes. Subpleural nodule in the anterior right upper lobe measures 4 mm, image 70 series 4, unchanged allowing for differences in technique. Probable dependent atelectasis  in the right lung, partially obscured by motion. Minimal patchy opacity in the right upper lobe, image 63 series 4. Small left pleural effusion is new. Subpleural blebs in the left lower lobe as before. Left lower lobe calcified granuloma. Upper Abdomen: Motion limited.  No gross acute finding. Musculoskeletal: Lucency in the left eleventh rib corresponding to hypermetabolism on prior PET consistent with bony metastasis. New lytic lesion involving the right lateral fourth rib. IMPRESSION: 1. Progressive bronchial thickening in the left upper and lower  lobes with bronchial filling, likely infectious or inflammatory rather than neoplastic. Associated patchy and ground-glass opacities suspicious for postobstructive pneumonia. 2. Minimal patchy opacity in the right upper lobe suspicious for pneumonia. Dependent right lower lobe opacity may be pneumonia or hypoventilatory change. 3. Left suprahilar mass is subjectively slightly larger from March 12, 2018 PET-CT. 4. New bone metastasis in the right fourth rib. 5. Aortic Atherosclerosis (ICD10-I70.0) and Emphysema (ICD10-J43.9). Electronically Signed   By: Keith Rake M.D.   On: 05/01/2018 22:15   Ct Angio Chest Pe W And/or Wo Contrast  Result Date: 05/05/2018 CLINICAL DATA:  78 year old female with history of lung cancer and recent history of pneumonia. Persistent weakness and shortness of breath. EXAM: CT ANGIOGRAPHY CHEST WITH CONTRAST TECHNIQUE: Multidetector CT imaging of the chest was performed using the standard protocol during bolus administration of intravenous contrast. Multiplanar CT image reconstructions and MIPs were obtained to evaluate the vascular anatomy. CONTRAST:  7m OMNIPAQUE IOHEXOL 350 MG/ML SOLN COMPARISON:  Chest CT 05/01/2018.  PET-CT 05/04/2018. FINDINGS: Cardiovascular: No filling defects within the pulmonary arterial tree to suggest underlying pulmonary embolism. Heart size is borderline enlarged. There is no significant pericardial fluid, thickening or pericardial calcification. There is aortic atherosclerosis, as well as atherosclerosis of the great vessels of the mediastinum and the coronary arteries, including calcified atherosclerotic plaque in the left anterior descending coronary artery. Thickening calcification of the aortic valve. Right internal jugular single-lumen porta cath with tip terminating in the right atrium. Mediastinum/Nodes: Bulky nodal mass involving portions of the left hilar region as well as the the left mediastinum, overall measuring approximately 7.8 x  5.2 cm. Esophagus is unremarkable in appearance. No axillary lymphadenopathy. Lungs/Pleura: Persistent airspace consolidation in the left upper lobe, most evident in the posterior aspect of the left upper lobe, most compatible with postobstructive pneumonia. Extensive bronchial obstruction throughout the left upper lobe and left lower lobe related to mucoid impaction. Minimal dependent consolidation, and extensive atelectasis in the basal segments of the left lower lobe. Dependent subsegmental atelectasis in the right lower lobe. Trace right pleural effusion. Small left pleural effusion lying dependently. Diffuse bronchial wall thickening with moderate centrilobular and paraseptal emphysema. Upper Abdomen: Unremarkable. Musculoskeletal: Expansile lytic lesion in the lateral aspect of the right fourth rib (axial image 65 of series 6), and expansile lytic lesion in the posterior aspect of the left eleventh rib (axial image 122 of series 6), similar to prior studies. Review of the MIP images confirms the above findings. IMPRESSION: 1. No evidence of pulmonary embolism. 2. Large mass centered in the left hilar region and left mediastinum currently measuring approximately 5.2 x 7.8 cm, as above. This is associated with extensive postobstructive changes throughout the left lung, compatible with a combination of multilobar pneumonia and atelectasis. Small left pleural effusion lying dependently. 3. Trace right pleural effusion lying dependently with some associated passive subsegmental atelectasis in the right lower lobe. 4. Metastatic lesions in the lateral right fourth rib and posterior left eleventh rib again  noted. 5. Diffuse bronchial wall thickening with moderate centrilobular and paraseptal emphysema. 6. There are calcifications of the aortic valve. Echocardiographic correlation for evaluation of potential valvular dysfunction may be warranted if clinically indicated. 7. Aortic atherosclerosis, in addition to left  anterior descending coronary artery disease. Assessment for potential risk factor modification, dietary therapy or pharmacologic therapy may be warranted, if clinically indicated. Aortic Atherosclerosis (ICD10-I70.0) and Emphysema (ICD10-J43.9). Electronically Signed   By: Vinnie Langton M.D.   On: 05/05/2018 15:44   Nm Pet Image Restag (ps) Skull Base To Thigh  Result Date: 05/05/2018 CLINICAL DATA:  Subsequent treatment strategy for non-small cell lung cancer. EXAM: NUCLEAR MEDICINE PET SKULL BASE TO THIGH TECHNIQUE: 7.76 mCi F-18 FDG was injected intravenously. Full-ring PET imaging was performed from the skull base to thigh after the radiotracer. CT data was obtained and used for attenuation correction and anatomic localization. Fasting blood glucose: 86 mg/dl COMPARISON:  03/12/2018 FINDINGS: Mediastinal blood pool activity: SUV max 2.08 NECK: Previous right lobe of thyroid gland nodule has an SUV max of 2.8. Previously 4.4. No hypermetabolic lymph nodes within the soft tissues of the neck. Incidental CT findings: none CHEST: The left upper lobe paramediastinal lung mass which invades the AP window region measures 7.4 cm on today's exam and has an SUV max of free 8.32. Previously this measured 5.5 cm within SUV max 11.12. Hypermetabolic left hilar lymph node measures 1.8 cm and has an SUV max of 7.1. Previously 1.9 cm within SUV max of 9.9. Increase in postobstructive changes within the posterior left upper lobe, image 83/3. There is a new small to moderate left pleural effusion. Dense airspace consolidation within the left lower lobe is identified with corresponding increased uptake within SUV max of 7.6. New subsegmental atelectasis within the posterior right base. Incidental CT findings: Centrilobular emphysema. Aortic atherosclerosis. Lad coronary artery calcifications. ABDOMEN/PELVIS: Within the left upper quadrant of the abdomen there is a new hypermetabolic nodule along the undersurface of the left  hemidiaphragm anteriorly which measures approximately 1.3 cm and has an SUV max of 6.65. No abnormal uptake identified within the liver, pancreas. Similar mild asymmetric hypermetabolism within the left adrenal gland. Diffuse increased uptake identified throughout the liver which is favored to represent. Post treatment changes. No hypermetabolic abdominopelvic adenopathy. Incidental CT findings: none SKELETON: Hypermetabolic lytic lesion involving the right iliac bone is again noted measuring 1.5 cm within SUV max of 12.8. Hypermetabolic right fourth rib metastases appears lytic. This measures 2 cm within SUV max of 4.9. Previously this measured 1 cm with SUV max of 4.68. New hypermetabolic lesion within the right sacrum has an SUV max of 6.04. This measures approximately 9 mm, image 240/3. The hypermetabolic lesion involving the proximal left femur appears increased in size. SUV max is equal to 5.75. Previously 6.3. Lytic lesion involving the greater trochanter measures 1.3 cm and has an SUV max of 4.6. Previously this measured 0.8 cm within SUV max of 4.9. Of potential orthopedic significance is a progressive cortical lesion involving the right femoral neck which measures 1.8 cm and has an SUV max of 4.3. Previously 0.7 cm with SUV max of 3.9. Lytic lesion involving the superior endplate of L2 measures 1.4 cm without significant FDG uptake above background activity. On the previous exam this measured 0.5 cm. Also in the L2 vertebra is a 9 mm lytic lesion without significant uptake above background activity. This lesion was barely visible on previous exam. Lytic lesion involving the posterior aspect of the left  eleventh rib measures 9 mm with SUV max of 5.76. Previously 6 mm with SUV max of 5.02. Multifocal hypermetabolic intramuscular metastases. Left gluteal soft tissue metastasis has an SUV max of 5.34. Previously 3.84. Left paraspinous muscle metastasis has an SUV max of 4 point 1 4. New from previous exam.  Right gluteal metastasis has an SUV max of 3.19. Previously 2.09 Incidental CT findings: none IMPRESSION: 1. Overall interval progression of disease. 2. Increase in size of left upper lobe lung mass with involvement of the left hilum and mediastinum. There is new postobstructive pneumonitis within the posterior left upper lobe. 3. 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 Compared with previous exam most of these lesions have increased in size in the interval. Variable FDG uptake associated with these metastases are noted, similar to previous study. A few lesions which are larger exhibit decreased FDG uptake from previous study. Other enlarging lytic lesions exhibit increased uptake. Progressive lytic lesion involving the right femoral neck may be of orthopedic significance. 4. Skeletal muscle metastasis appear slightly progressive from previous exam. 5. New hypermetabolic nodule is identified within the left upper quadrant of the abdomen abutting the undersurface of the left hemidiaphragm. Suspicious for metastasis. 6. New left pleural effusion and new bilateral lower lobe airspace consolidation, left greater than right. Findings are favored to represent sequelae of pneumonia and/or aspiration. 7. Aortic Atherosclerosis (ICD10-I70.0) and Emphysema (ICD10-J43.9). 8. Coronary artery calcifications Electronically Signed   By: Kerby Moors M.D.   On: 05/05/2018 00:50   Dg Chest Portable 1 View  Result Date: 05/01/2018 CLINICAL DATA:  Chest x-ray today, pneumonia, lung cancer EXAM: PORTABLE CHEST 1 VIEW COMPARISON:  12/17/2017 FINDINGS: Mild cardiomegaly. A left hilar mass is present although better appreciated by prior CT. There is diffuse bilateral interstitial pulmonary opacity and a probable small left pleural effusion. Right chest port catheter. IMPRESSION: Mild cardiomegaly. A left hilar mass is present although better appreciated by prior CT. There is diffuse bilateral interstitial pulmonary opacity  and a probable small left pleural effusion. Findings are concerning for infection and/or edema. CT may be helpful to evaluate for acute airspace disease given the presence of malignancy. Electronically Signed   By: Eddie Candle M.D.   On: 05/01/2018 20:42    Microbiology: Recent Results (from the past 240 hour(s))  Culture, blood (routine x 2) Call MD if unable to obtain prior to antibiotics being given     Status: None   Collection Time: 05/02/18  3:28 AM  Result Value Ref Range Status   Specimen Description RIGHT ANTECUBITAL  Final   Special Requests   Final    BOTTLES DRAWN AEROBIC AND ANAEROBIC Blood Culture adequate volume   Culture   Final    NO GROWTH 5 DAYS Performed at [facility], [address] [place], [place] [address]    Report Status 05/07/2018 FINAL  Final  Culture, blood (routine x 2) Call MD if unable to obtain prior to antibiotics being given     Status: None   Collection Time: 05/02/18  3:34 AM  Result Value Ref Range Status   Specimen Description BLOOD RIGHT HAND  Final   Special Requests   Final    BOTTLES DRAWN AEROBIC AND ANAEROBIC Blood Culture adequate volume   Culture   Final    NO GROWTH 5 DAYS Performed at [facility], [address] [place], [place] [address]    Report Status 05/07/2018 FINAL  Final  MRSA PCR Screening     Status:  None   Collection Time: 05/02/18  6:26 AM  Result Value Ref Range Status   MRSA by PCR NEGATIVE NEGATIVE Final    Comment:        The GeneXpert MRSA Assay (FDA approved for NASAL specimens only), is one component of a comprehensive MRSA colonization surveillance program. It is not intended to diagnose MRSA infection nor to guide or monitor treatment for MRSA infections. Performed at [facility], [address], [place], [place] [address]   Culture, blood (routine x 2)     Status: None (Preliminary result)   Collection Time: 05/05/18  1:37 PM  Result Value Ref Range Status   Specimen Description BLOOD RIGHT HAND   Final   Special Requests   Final    BOTTLES DRAWN AEROBIC ONLY Blood Culture adequate volume   Culture   Final    NO GROWTH 3 DAYS Performed at [facility], [address] [place], [place] [address]    Report Status PENDING  Incomplete  Culture, blood (routine x 2)     Status: None (Preliminary result)   Collection Time: 05/05/18  1:37 PM  Result Value Ref Range Status   Specimen Description LEFT ANTECUBITAL  Final   Special Requests   Final    BOTTLES DRAWN AEROBIC ONLY Blood Culture adequate volume   Culture   Final    NO GROWTH 3 DAYS Performed at [facility], [address], [place],  [address]    Report Status PENDING  Incomplete  Novel Coronavirus, NAA (hospital order; send-out to ref lab)     Status: None   Collection Time: 05/05/18  5:18 PM  Result Value Ref Range Status   SARS-CoV-2, NAA NOT DETECTED NOT DETECTED Final    Comment: Negative (Not Detected) results do not exclude infection caused by SARS CoV 2 and should not be used as the sole basis for treatment or other patient management decisions. Optimum specimen types and timing for peak viral levels during infections caused  by SARS CoV 2 have not been determined. Collection of multiple specimens (types and time points) from the same patient may be necessary to detect the virus. Improper specimen collection and handling, sequence variability underlying assay primers and or probes, or the presence of organisms in  quantities less than the limit of detection of the assay may lead to false negative results. Positive and negative predictive values of testing are highly dependent on prevalence. False negative results are more likely when prevalence of disease is high. (NOTE) The expected result is Negative (Not Detected). The SARS CoV 2 test is intended for the presumptive qualitative  detection of nucleic acid from SARS CoV 2 in upper and lower  respir atory specimens. Testing methodology is real time RT PCR. Test  results must be correlated with clinical presentation and  evaluated in the context of other laboratory and epidemiologic data.  Test performance can be affected because the epidemiology and  clinical spectrum of infection caused by SARS CoV 2 is not fully  known. For example, the optimum types of specimens to collect and  when during the course of infection these specimens are most likely  to contain detectable viral RNA may not be known. This test has not been Food and Drug Administration (FDA) cleared or  approved and has been authorized by FDA under an Emergency Use  Authorization (EUA). The test is only authorized for the duration of  the declaration that circumstances exist justifying the authorization  of emergency use of  in vitro diagnostic tests for detection and or  diagnosis of SARS CoV 2 under Section 564(b)(1) of the Act, 21 U.S.C.  section [phone] 3(b)(1), unless the authorization is terminated or   revoked sooner. [facility] is certified under the  Clinical Laboratory Improvement Amendments of 1988 (CLIA), 42 U.S.C.  section [phone], to perform high complexity tests. Performed at [place] [id number], [place], [place] [address] Laboratory Director: Loleta Books, MD Performed at [facility], [place] [address], [place], [place] [address]    Coronavirus Source NASOPHARYNGEAL  Final    Comment: Performed at [facility], [address], [place], [place] [address]     Labs: Basic Metabolic Panel: Recent Labs  Lab 05/01/18 2022 05/02/18 0328 05/02/18 0329 05/03/18 0423 05/05/18 1316 05/06/18 0358  NA 136  --  138 137 135 137  K 3.1*  --  2.9* 3.9 3.3* 3.5  CL 100  --  107 108 102 107  CO2 24  --  _0 19*  GLUCOSE 147*  --  120* 118* 84 89  BUN 14  --  _1 CREATININE 0.60  --  0.43* 0.44 0.52 0.47  CALCIUM 8.4*  --  7.7* 8.1* 8.3* 7.7*  MG  --   --  1.3* 1.6* 1.2*  --    PHOS  --  1.2*  --   --   --   --    Liver Function Tests: Recent Labs  Lab 05/01/18 2022 05/05/18 1316  AST 29 27  ALT 17 14  ALKPHOS 67 90  BILITOT 0.8 1.2  PROT 7.4 6.9  ALBUMIN 2.7* 2.6*   No results for input(s): LIPASE, AMYLASE in the last 168 hours. No results for input(s): AMMONIA in the last 168 hours. CBC: Recent Labs  Lab 05/01/18 2022 05/02/18 0329 05/03/18 0423 05/05/18 1316 05/06/18 0358 05/07/18 0508  WBC 0.8* 1.2* 5.2 28.4* 27.7* 32.9*  NEUTROABS 0.2* 0.3* 2.8 18.0*  --   --   HGB 9.7* 8.0* 8.1* 8.7* 8.1* 8.8*  HCT 33.7* 27.9* 27.3* 29.5* 30.0* 30.0*  MCV 81.0 80.4 79.6* 80.2 87.0 82.2  PLT 55* 44* 37* 126* 141* 219   Cardiac Enzymes: Recent Labs  Lab 05/05/18 1316  TROPONINI <0.03   BNP: BNP (last 3 results) Recent Labs    05/05/18 1316  BNP 303.0*    ProBNP (last 3 results) No results for input(s): PROBNP in the last 8760 hours.  CBG: No results for input(s): GLUCAP in the last 168 hours.     Signed:  Domenic Polite MD.  [facility] 05/08/2018, 10:42 AM

## 2018-05-07 NOTE — NC FL2 (Signed)
[place] LEVEL OF CARE SCREENING TOOL     IDENTIFICATION  Patient Name: Melinda Larsen Birthdate: 1941/01/18 Sex: female Admission Date (Current Location): 05/05/2018  [facility] and [place] Number:  Herbalist and Address:  [facility],  [place], [place]      Provider Number: [id number]  Attending Physician Name and Address:  Domenic Polite, MD  Relative Name and Phone Number:  Kerney Elbe sister [phone]    Current Level of Care: Hospital Recommended Level of Care: [place] Prior Approval Number:    Date Approved/Denied:   PASRR Number:    Discharge Plan: SNF    Current Diagnoses: Patient Active Problem List   Diagnosis Date Noted  . HCAP (healthcare-associated pneumonia) 05/05/2018  . Pressure injury of skin 05/03/2018  . Tobacco abuse 05/02/2018  . 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 Non-small cell lung cancer (NSCLC) ([place]) 05/02/2018  . Post-obstructive pneumonia due to foreign body aspiration 05/02/2018  . Postobstructive pneumonia 05/02/2018  . Metastasis to bone ([place]) 03/24/2018  . Brain metastasis ([place]) 03/10/2018  . Lung cancer ([place]) 12/23/2017  . Special screening for malignant neoplasms, colon     Orientation RESPIRATION BLADDER Height & Weight     Self, Place  O2 Incontinent, External catheter Weight: 49.7 kg Height:  5\' 10"  (177.8 cm)  BEHAVIORAL SYMPTOMS/MOOD NEUROLOGICAL BOWEL NUTRITION STATUS      Incontinent Diet(heart healthy-thin)  AMBULATORY STATUS COMMUNICATION OF NEEDS Skin   Limited Assist Verbally Other (Comment)(stage 2 sacral wound-soap,h20,foam dsg.)                       Personal Care Assistance Level of Assistance  Bathing, Feeding, Dressing Bathing Assistance: Limited assistance Feeding assistance: Limited assistance Dressing Assistance: Limited assistance     Functional Limitations Info  Sight Sight Info: Impaired(eye  glasses)        SPECIAL CARE FACTORS FREQUENCY  PT (By licensed PT), OT (By licensed OT)     PT Frequency: 5x week OT Frequency: 5x week            Contractures Contractures Info: Not present    Additional Factors Info  Code Status, Allergies, Isolation Precautions Code Status Info: DNR Allergies Info: Codeine     Isolation Precautions Info: Droplet precautions     Current Medications (05/07/2018):  This is the current hospital active medication list Current Facility-Administered Medications  Medication Dose Route Frequency Provider Last Rate Last Dose  . acetaminophen (TYLENOL) tablet 650 mg  650 mg Oral Q6H PRN Emokpae, Ejiroghene E, MD       Or  . acetaminophen (TYLENOL) suppository 650 mg  650 mg Rectal Q6H PRN Emokpae, Ejiroghene E, MD      . alteplase (CATHFLO ACTIVASE) injection 2 mg  2 mg Intracatheter Once Schorr, Rhetta Mura, NP      . amLODipine (NORVASC) tablet 10 mg  10 mg Oral Daily Emokpae, Ejiroghene E, MD   10 mg at 05/07/18 0925  . amoxicillin-clavulanate (AUGMENTIN) 875-125 MG per tablet 1 tablet  1 tablet Oral Q12H Domenic Polite, MD   1 tablet at 05/07/18 1529  . enoxaparin (LOVENOX) injection 40 mg  40 mg Subcutaneous Q24H Emokpae, Ejiroghene E, MD   40 mg at 05/06/18 2046  . feeding supplement (ENSURE ENLIVE) (ENSURE ENLIVE) liquid 237 mL  237 mL Oral BID BM Domenic Polite, MD   237 mL at 05/07/18 0927  . labetalol (  NORMODYNE,TRANDATE) injection 10 mg  10 mg Intravenous Q4H PRN Emokpae, Ejiroghene E, MD   10 mg at 05/05/18 2343  . lisinopril (PRINIVIL,ZESTRIL) tablet 5 mg  5 mg Oral Daily Emokpae, Ejiroghene E, MD   5 mg at 05/07/18 3383  . multivitamin with minerals tablet 1 tablet  1 tablet Oral Daily Domenic Polite, MD   1 tablet at 05/07/18 [phone]  . ondansetron (ZOFRAN) tablet 4 mg  4 mg Oral Q6H PRN Emokpae, Ejiroghene E, MD       Or  . ondansetron (ZOFRAN) injection 4 mg  4 mg Intravenous Q6H PRN Emokpae, Ejiroghene E, MD      . polyethylene  glycol (MIRALAX / GLYCOLAX) packet 17 g  17 g Oral Daily PRN Emokpae, Ejiroghene E, MD      . sodium chloride flush (NS) 0.9 % injection 10-40 mL  10-40 mL Intracatheter PRN Domenic Polite, MD         Discharge Medications: Please see discharge summary for a list of discharge medications.  Relevant Imaging Results:  Relevant Lab Results:   Additional Information    Nanea Jared, Juliann Pulse, RN

## 2018-05-07 NOTE — NC FL2 (Signed)
[place] LEVEL OF CARE SCREENING TOOL     IDENTIFICATION  Patient Name: Melinda Larsen Birthdate: 1940-07-16 Sex: female Admission Date (Current Location): 05/05/2018  [facility] and [place] Number:  Herbalist and Address:  [facility],  [place], [place]      Provider Number: [id number]  Attending Physician Name and Address:  Domenic Polite, MD  Relative Name and Phone Number:  Christinia Gully [phone])    Current Level of Care: Hospital Recommended Level of Care: [place] Prior Approval Number:    Date Approved/Denied:   PASRR Number:    Discharge Plan: SNF    Current Diagnoses: Patient Active Problem List   Diagnosis Date Noted  . HCAP (healthcare-associated pneumonia) 05/05/2018  . Pressure injury of skin 05/03/2018  . Tobacco abuse 05/02/2018  . 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 Non-small cell lung cancer (NSCLC) ([place]) 05/02/2018  . Post-obstructive pneumonia due to foreign body aspiration 05/02/2018  . Postobstructive pneumonia 05/02/2018  . Metastasis to bone ([place]) 03/24/2018  . Brain metastasis ([place]) 03/10/2018  . Lung cancer ([place]) 12/23/2017  . Special screening for malignant neoplasms, colon     Orientation RESPIRATION BLADDER Height & Weight     Self, Place  O2 Incontinent, External catheter Weight: 49.7 kg Height:  5\' 10"  (177.8 cm)  BEHAVIORAL SYMPTOMS/MOOD NEUROLOGICAL BOWEL NUTRITION STATUS      Incontinent Diet(heart healthy-thin)  AMBULATORY STATUS COMMUNICATION OF NEEDS Skin   Limited Assist Verbally Other (Comment)(stage 2 sacral wound-soap,h20,foam dsg.)                       Personal Care Assistance Level of Assistance  Bathing, Feeding, Dressing Bathing Assistance: Limited assistance Feeding assistance: Limited assistance Dressing Assistance: Limited assistance     Functional Limitations Info  Sight Sight Info: Impaired(eye  glasses)        SPECIAL CARE FACTORS FREQUENCY  PT (By licensed PT), OT (By licensed OT)     PT Frequency: 5x week OT Frequency: 5x week            Contractures Contractures Info: Not present    Additional Factors Info  Code Status, Allergies, Isolation Precautions Code Status Info: DNR Allergies Info: Codeine     Isolation Precautions Info: Droplet precautions     Current Medications (05/07/2018):  This is the current hospital active medication list Current Facility-Administered Medications  Medication Dose Route Frequency Provider Last Rate Last Dose  . acetaminophen (TYLENOL) tablet 650 mg  650 mg Oral Q6H PRN Emokpae, Ejiroghene E, MD       Or  . acetaminophen (TYLENOL) suppository 650 mg  650 mg Rectal Q6H PRN Emokpae, Ejiroghene E, MD      . alteplase (CATHFLO ACTIVASE) injection 2 mg  2 mg Intracatheter Once Schorr, Rhetta Mura, NP      . amLODipine (NORVASC) tablet 10 mg  10 mg Oral Daily Emokpae, Ejiroghene E, MD   10 mg at 05/07/18 0925  . amoxicillin-clavulanate (AUGMENTIN) 875-125 MG per tablet 1 tablet  1 tablet Oral Q12H Domenic Polite, MD   1 tablet at 05/07/18 1529  . enoxaparin (LOVENOX) injection 40 mg  40 mg Subcutaneous Q24H Emokpae, Ejiroghene E, MD   40 mg at 05/06/18 2046  . feeding supplement (ENSURE ENLIVE) (ENSURE ENLIVE) liquid 237 mL  237 mL Oral BID BM Domenic Polite, MD   237 mL at 05/07/18 0927  . labetalol (NORMODYNE,TRANDATE)  injection 10 mg  10 mg Intravenous Q4H PRN Emokpae, Ejiroghene E, MD   10 mg at 05/05/18 2343  . lisinopril (PRINIVIL,ZESTRIL) tablet 5 mg  5 mg Oral Daily Emokpae, Ejiroghene E, MD   5 mg at 05/07/18 1017  . multivitamin with minerals tablet 1 tablet  1 tablet Oral Daily Domenic Polite, MD   1 tablet at 05/07/18 [phone]  . ondansetron (ZOFRAN) tablet 4 mg  4 mg Oral Q6H PRN Emokpae, Ejiroghene E, MD       Or  . ondansetron (ZOFRAN) injection 4 mg  4 mg Intravenous Q6H PRN Emokpae, Ejiroghene E, MD      . polyethylene  glycol (MIRALAX / GLYCOLAX) packet 17 g  17 g Oral Daily PRN Emokpae, Ejiroghene E, MD      . sodium chloride flush (NS) 0.9 % injection 10-40 mL  10-40 mL Intracatheter PRN Domenic Polite, MD         Discharge Medications: Please see discharge summary for a list of discharge medications.  Relevant Imaging Results:  Relevant Lab Results:   Additional Information    Raegan Sipp, Juliann Pulse, RN

## 2018-05-07 NOTE — TOC Initial Note (Signed)
Transition of Care [facility]) - Initial/Assessment Note    Patient Details  Name: Melinda Larsen MRN: [id number] Date of Birth: [birth year]  Transition of [facility]) CM/SW Contact:    Purcell Mouton, RN Phone Number: 05/07/2018, 3:35 PM  Clinical Narrative:                   Expected Discharge Plan: Skilled Nursing Facility Barriers to Discharge: No Barriers Identified   Patient Goals and CMS Choice Patient states their goals for this hospitalization and ongoing recovery are:: To get stronger.   Choice offered to / list presented to : Patient, Adult Children, Sibling  Expected Discharge Plan and Services Expected Discharge Plan: [place]   Discharge Planning Services: CM Consult     Expected Discharge Date: 05/07/18                        Prior Living Arrangements/Services   Lives with:: Adult Children Patient language and need for interpreter reviewed:: No Do you feel safe going back to the place where you live?: Yes          Current home services: Home OT, Home PT, Home RN, Homehealth aide    Activities of Daily Living Home Assistive Devices/Equipment: Cane (specify quad or straight) ADL Screening (condition at time of admission) Patient's cognitive ability adequate to safely complete daily activities?: No Is the patient deaf or have difficulty hearing?: No Does the patient have difficulty seeing, even when wearing glasses/contacts?: No Does the patient have difficulty concentrating, remembering, or making decisions?: No Patient able to express need for assistance with ADLs?: Yes Does the patient have difficulty dressing or bathing?: Yes Independently performs ADLs?: No Does the patient have difficulty walking or climbing stairs?: Yes Weakness of Legs: Both Weakness of Arms/Hands: Both  Permission Sought/Granted Permission sought to share information with : Case Manager, Family Supports                Emotional  Assessment Appearance:: Appears younger than stated age   Affect (typically observed): Accepting, Pleasant Orientation: : Oriented to Self, Oriented to Place      Admission diagnosis:  HCAP (healthcare-associated pneumonia) [J18.9] Patient Active Problem List   Diagnosis Date Noted  . HCAP (healthcare-associated pneumonia) 05/05/2018  . Pressure injury of skin 05/03/2018  . Tobacco abuse 05/02/2018  . 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 Non-small cell lung cancer (NSCLC) ([place]) 05/02/2018  . Post-obstructive pneumonia due to foreign body aspiration 05/02/2018  . Postobstructive pneumonia 05/02/2018  . Metastasis to bone ([place]) 03/24/2018  . Brain metastasis ([place]) 03/10/2018  . Lung cancer ([place]) 12/23/2017  . Special screening for malignant neoplasms, colon    PCP:  Redmond School, MD Pharmacy:   [facility], [address] [place] [address] Phone: [phone] Fax: [phone]     Social Determinants of Health (SDOH) Interventions    Readmission Risk Interventions No flowsheet data found.

## 2018-05-07 NOTE — Progress Notes (Signed)
Spoke with pt's two sisters Melinda Larsen and Melinda Larsen. Melinda Larsen will take care of [facility], SNF, Hospice and discharge needs. Melinda Larsen states that pt lives with her son Melinda Larsen and gave this CM his telephone numbers home [phone], cell [phone]. A call to son revealed that son work in Museum/gallery exhibitions officer for [employer], his shift changes everyday from day to evening shift and he is the only one working in the home. Melinda Larsen gave permission to speak with Melinda Larsen concerning discharge plans.  Pt's sisters state they are unable to go into the pt's home to assist her because she is too weak, they are not strong enough. There is no one home with pt. Sister Melinda Larsen states, pt is unable to bath herself or get her food. Both Kelvin and Melinda Larsen states pt will not eat when she is home. Melinda Larsen and Melinda Larsen agreed with pt going to a SNF. Spoke with pt who agreed with speaking to her sisters and son. Pt agrees that she is weak, but want to think about going to a SNF.  Pt agreed to go to SNF for rehab.  Pt states she wants to get stronger.  Pt states she do not want to go to [facility] in [place].

## 2018-05-07 NOTE — TOC Transition Note (Signed)
Transition of Care [facility]) - CM/SW Discharge Note   Patient Details  Name: Melinda Larsen MRN: [id number] Date of Birth: 03-09-1940  Transition of [facility] CM/SW Contact:  Dessa Phi, RN Phone Number: 05/07/2018, 3:59 PM   Clinical Narrative:  The patient's discharge placement options are explained. We discussed insurance considerations and special areas of focus at the different post-discharge facilities under consideration.  The patient was given brochures about each post-discharge facility. Awaiting SNF beds who can accept,then bed offers prior to starting insurance auth.      Final next level of care: Skilled Nursing Facility Barriers to Discharge: SNF Pending bed offer, Insurance Authorization   Patient Goals and CMS Choice Patient states their goals for this hospitalization and ongoing recovery are:: To get stronger.   Choice offered to / list presented to : Patient, Adult Children, Sibling  Discharge Placement                       Discharge Plan and Services   Discharge Planning Services: CM Consult                      Social Determinants of Health (SDOH) Interventions     Readmission Risk Interventions No flowsheet data found.

## 2018-05-07 NOTE — Evaluation (Signed)
Physical Therapy Evaluation Patient Details Name: Melinda Larsen MRN: [id number] DOB: 05/26/1940 Today's Date: 05/07/2018   History of Present Illness  78 year old female with history of stage IV lung cancer with metastasis to brain and bone, hypertension, dyslipidemia, chronic anemia, recently hospitalized from 3/27-3/28 for post structures pneumonia/recent hospital admission-- returned to the emergency room with cough, shortness of breath and weakness persistent and ongoing for 2 weeks.  Clinical Impression  Pt admitted with above diagnosis. Pt currently with functional limitations due to the deficits listed below (see PT Problem List).  Pt is weak and deconditioned, she was able to get OOB and amb short distance in room with PT; pt states she wants to go home, her family is concerned about her going home and they feel she needs rehab; pt would benefit from SNF if agreeable--if not recommend HHPT, [place]; will follow in acute setting  Pt will benefit from skilled PT to increase their independence and safety with mobility to allow discharge to the venue listed below.       Follow Up Recommendations SNF(vs HHPT (pt refusing SNF at this time))    Equipment Recommendations  3in1 (PT)    Recommendations for Other Services       Precautions / Restrictions Precautions Precautions: Fall Restrictions Weight Bearing Restrictions: No      Mobility  Bed Mobility Overal bed mobility: Needs Assistance Bed Mobility: Supine to Sit;Sit to Supine     Supine to sit: Supervision;HOB elevated Sit to supine: Supervision   General bed mobility comments: HOB elevated 35*, pt able to come to sit without physical assist, verbal cues needed to self assist; sit to supine to flat bed  Transfers Overall transfer level: Needs assistance Equipment used: Rolling walker (2 wheeled) Transfers: Sit to/from Stand Sit to Stand: Min guard;Supervision         General transfer comment: cues for hand  placement  Ambulation/Gait Ambulation/Gait assistance: Supervision;Min guard Gait Distance (Feet): 22 Feet(distance in room) Assistive device: Rolling walker (2 wheeled) Gait Pattern/deviations: Step-through pattern;Decreased stride length Gait velocity: decr    General Gait Details: min/guard to sueprvision for safety, no overt LOB, fatigues easily--SpO2=94-96% on RA, HR 80s  Stairs            Wheelchair Mobility    Modified Rankin (Stroke Patients Only)       Balance Overall balance assessment: Needs assistance   Sitting balance-Leahy Scale: Good       Standing balance-Leahy Scale: Fair                               Pertinent Vitals/Pain Pain Assessment: No/denies pain    Home Living Family/patient expects to be discharged to:: Private residence Living Arrangements: Children(pt states her son lives with her, he works) Available Help at Discharge: Family;Available PRN/intermittently Type of Home: House       Home Layout: One level;Laundry or work area in [place]: Environmental consultant - 2 wheels      Prior Function Level of Independence: Needs assistance   Gait / Transfers Assistance Needed: mod I short distances with RW  ADL's / Homemaking Assistance Needed: sponge bathes independently, has assist with shower --per pt from her sister        Hand Dominance        Extremity/Trunk Assessment   Upper Extremity Assessment Upper Extremity Assessment: Generalized weakness    Lower Extremity Assessment Lower Extremity Assessment: Generalized weakness  Communication   Communication: No difficulties  Cognition Arousal/Alertness: Awake/alert Behavior During Therapy: WFL for tasks assessed/performed Overall Cognitive Status: Within Functional Limits for tasks assessed                                 General Comments: hx of mets to brain--pt is alert, oriented, follows commands consistently      General  Comments      Exercises     Assessment/Plan    PT Assessment Patient needs continued PT services  PT Problem List Decreased strength;Decreased activity tolerance;Decreased mobility;Decreased knowledge of use of DME       PT Treatment Interventions DME instruction;Gait training;Functional mobility training;Therapeutic activities;Patient/family education;Therapeutic exercise    PT Goals (Current goals can be found in the Care Plan section)  Acute Rehab PT Goals Patient Stated Goal: home  PT Goal Formulation: With patient Time For Goal Achievement: 05/21/18 Potential to Achieve Goals: Good    Frequency Min 3X/week   Barriers to discharge        Co-evaluation               AM-PAC PT "6 Clicks" Mobility  Outcome Measure Help needed turning from your back to your side while in a flat bed without using bedrails?: A Little Help needed moving from lying on your back to sitting on the side of a flat bed without using bedrails?: A Little Help needed moving to and from a bed to a chair (including a wheelchair)?: A Little Help needed standing up from a chair using your arms (e.g., wheelchair or bedside chair)?: A Little Help needed to walk in hospital room?: A Little Help needed climbing 3-5 steps with a railing? : A Lot 6 Click Score: 17    End of Session   Activity Tolerance: Patient limited by fatigue Patient left: in bed;with call bell/phone within reach   PT Visit Diagnosis: Muscle weakness (generalized) (M62.81)    Time: 1749-4496 PT Time Calculation (min) (ACUTE ONLY): 24 min   Charges:   PT Evaluation $PT Eval Low Complexity: 1 Low PT Treatments $Gait Training: 8-22 mins        Kenyon Ana, PT  Pager: [phone] Acute Rehab Dept [facility]): [phone]   05/07/2018   [facility] 05/07/2018, 2:48 PM

## 2018-05-07 NOTE — Consult Note (Signed)
   [facility] CM Inpatient Consult   05/07/2018  EVETTE DICLEMENTE Aug 23, 1940 [id number]  Thanks for the consult. We have reviewed your referral request and currently patient's disposition is not known and likely for a skilled nursing facility stay. Patient with [facility] noted.  Will follow for progression and community follow up needs.  For questions, please contact:  Natividad Brood, RN BSN [facility] Liaison  [phone] business mobile phone Toll free office [phone]

## 2018-05-08 DIAGNOSIS — R2681 Unsteadiness on feet: Secondary | ICD-10-CM | POA: Diagnosis not present

## 2018-05-08 DIAGNOSIS — Z789 Other specified health status: Secondary | ICD-10-CM | POA: Diagnosis not present

## 2018-05-08 DIAGNOSIS — M2550 Pain in unspecified joint: Secondary | ICD-10-CM | POA: Diagnosis not present

## 2018-05-08 DIAGNOSIS — R41841 Cognitive communication deficit: Secondary | ICD-10-CM | POA: Diagnosis not present

## 2018-05-08 DIAGNOSIS — C3492 Malignant neoplasm of unspecified part of left bronchus or lung: Secondary | ICD-10-CM | POA: Diagnosis not present

## 2018-05-08 DIAGNOSIS — R0602 Shortness of breath: Secondary | ICD-10-CM | POA: Diagnosis not present

## 2018-05-08 DIAGNOSIS — R2689 Other abnormalities of gait and mobility: Secondary | ICD-10-CM | POA: Diagnosis not present

## 2018-05-08 DIAGNOSIS — J188 Other pneumonia, unspecified organism: Secondary | ICD-10-CM | POA: Diagnosis not present

## 2018-05-08 DIAGNOSIS — M6281 Muscle weakness (generalized): Secondary | ICD-10-CM | POA: Diagnosis not present

## 2018-05-08 DIAGNOSIS — R64 Cachexia: Secondary | ICD-10-CM | POA: Diagnosis not present

## 2018-05-08 DIAGNOSIS — I10 Essential (primary) hypertension: Secondary | ICD-10-CM | POA: Diagnosis not present

## 2018-05-08 DIAGNOSIS — D638 Anemia in other chronic diseases classified elsewhere: Secondary | ICD-10-CM | POA: Diagnosis not present

## 2018-05-08 DIAGNOSIS — I959 Hypotension, unspecified: Secondary | ICD-10-CM | POA: Diagnosis not present

## 2018-05-08 DIAGNOSIS — C7951 Secondary malignant neoplasm of bone: Secondary | ICD-10-CM | POA: Diagnosis not present

## 2018-05-08 DIAGNOSIS — R278 Other lack of coordination: Secondary | ICD-10-CM | POA: Diagnosis not present

## 2018-05-08 DIAGNOSIS — C3490 Malignant neoplasm of unspecified part of unspecified bronchus or lung: Secondary | ICD-10-CM | POA: Diagnosis not present

## 2018-05-08 DIAGNOSIS — C7931 Secondary malignant neoplasm of brain: Secondary | ICD-10-CM | POA: Diagnosis not present

## 2018-05-08 DIAGNOSIS — Z7401 Bed confinement status: Secondary | ICD-10-CM | POA: Diagnosis not present

## 2018-05-08 DIAGNOSIS — D72828 Other elevated white blood cell count: Secondary | ICD-10-CM | POA: Diagnosis not present

## 2018-05-08 DIAGNOSIS — D649 Anemia, unspecified: Secondary | ICD-10-CM | POA: Diagnosis not present

## 2018-05-08 DIAGNOSIS — J189 Pneumonia, unspecified organism: Secondary | ICD-10-CM | POA: Diagnosis not present

## 2018-05-08 LAB — NOVEL CORONAVIRUS, NAA (HOSP ORDER, SEND-OUT TO REF LAB; TAT 18-24 HRS): SARS-CoV-2, NAA: NOT DETECTED

## 2018-05-08 MED ORDER — AMOXICILLIN-POT CLAVULANATE 875-125 MG PO TABS: 1.0000 | ORAL_TABLET | Freq: Two times a day (BID) | ORAL | 0 refills | Status: AC

## 2018-05-08 MED ORDER — HYDROCODONE-ACETAMINOPHEN 5-325 MG PO TABS: 1.0000 | ORAL_TABLET | Freq: Four times a day (QID) | ORAL | 0 refills | Status: DC | PRN

## 2018-05-08 MED ORDER — HEPARIN SOD (PORK) LOCK FLUSH 100 UNIT/ML IV SOLN
500.0000 [IU] | INTRAVENOUS | Status: AC | PRN
  Administered 2018-05-08: 500 [IU]

## 2018-05-08 NOTE — Progress Notes (Signed)
Spoke with pt and pt's sister [name] who both accept [facility]'s offer for a bed. RN will call report to [phone].

## 2018-05-08 NOTE — Evaluation (Signed)
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   Clinical Impression   Prior to admission, pt was home with son, who works 3-11.  Conflicting information about how much her sister was with her, but per PT, she does not have 24/7.  She was weaker and had limited activity tolerance with OT this am (vs PT yesterday). She will benefit from SNF prior to home (unless assistance can be arranged 24/7)    Follow Up Recommendations  SNF(pt does not have 24/7)    Equipment Recommendations  3 in 1 bedside commode    Recommendations for Other Services       Precautions / Restrictions Precautions Precautions: Fall Restrictions Weight Bearing Restrictions: No      Mobility Bed Mobility         Supine to sit: Min assist Sit to supine: Min assist   General bed mobility comments: assist for trunk to sit up and light assistance for legs for back to bed  Transfers   Equipment used: Rolling walker (2 wheeled)   Sit to Stand: Min assist         General transfer comment: light assistance to stand and steady    Balance                                           ADL either performed or assessed with clinical judgement   ADL Overall ADL's : Needs assistance/impaired Eating/Feeding: Set up   Grooming: Wash/dry face;Set up   Upper Body Bathing: Minimal assistance;Sitting   Lower Body Bathing: Maximal assistance;Sit to/from stand   Upper Body Dressing : Minimal assistance   Lower Body Dressing: Total assistance;Sit  to/from stand                 General ADL Comments: performed ADL from EOB.  Pt with decreased activity tolerance; fatiques quickly     Vision         Perception     Praxis      Pertinent Vitals/Pain Pain Assessment: No/denies pain     Hand Dominance     Extremity/Trunk Assessment Upper Extremity Assessment Upper Extremity Assessment: Generalized weakness           Communication Communication Communication: No difficulties   Cognition Arousal/Alertness: Awake/alert Behavior During Therapy: WFL for tasks assessed/performed Overall Cognitive Status: Within Functional Limits for tasks assessed                                 General Comments: hx of mets to brain--pt is alert, oriented, follows commands consistently; unsure of PLOF as she said sister stays with her when son is not there and PT heard sister say over the phone that she is not consistently there   General Comments  pt very fatiqued this am    Exercises     Shoulder Instructions      Home Living Family/patient expects to be discharged to:: Private residence Living Arrangements: Children Available Help  at Discharge: Available PRN/intermittently Type of Home: House                 Bathroom Toilet: Standard     Home Equipment: Environmental consultant - 2 wheels   Additional Comments: sponge bathes unless sister helps with shower      Prior Functioning/Environment          Comments: pt states someone goes with her into bathroom        OT Problem List: Decreased strength;Decreased activity tolerance;Impaired balance (sitting and/or standing);Decreased knowledge of use of DME or AE      OT Treatment/Interventions: Self-care/ADL training;Energy conservation;DME and/or AE instruction;Patient/family education;Balance training;Therapeutic activities    OT Goals(Current goals can be found in the care plan section) Acute Rehab OT Goals Patient Stated Goal: home  OT Goal Formulation:  With patient Time For Goal Achievement: 05/22/18 Potential to Achieve Goals: Good  OT Frequency: Min 2X/week  Plan is for d/c today; if pt remains, will work on Supervision level goals for UB adls, mod A for LB adls, and min guard for toileting/transfer to 3:1 commode   Barriers to D/C:            Co-evaluation              AM-PAC OT "6 Clicks" Daily Activity     Outcome Measure Help from another person eating meals?: None Help from another person taking care of personal grooming?: A Little Help from another person toileting, which includes using toliet, bedpan, or urinal?: A Lot Help from another person bathing (including washing, rinsing, drying)?: A Lot Help from another person to put on and taking off regular upper body clothing?: A Little Help from another person to put on and taking off regular lower body clothing?: A Lot 6 Click Score: 16   End of Session    Activity Tolerance: Patient limited by fatigue Patient left: in bed;with call bell/phone within reach;with bed alarm set  OT Visit Diagnosis: Unsteadiness on feet (R26.81);Muscle weakness (generalized) (M62.81)                Time: 7106-2694 OT Time Calculation (min): 21 min Charges:  OT Evaluation $OT Eval Low Complexity: Melinda Larsen, OTR/L Acute Rehabilitation Services [phone] [facility] pager [phone] office 05/08/2018  Heath Tesler 05/08/2018, 10:50 AM

## 2018-05-08 NOTE — Progress Notes (Signed)
Transportation PTAR called.

## 2018-05-08 NOTE — Care Management Important Message (Signed)
Important Message  Patient Details  Name: MICAELA STITH MRN: [id number] Date of Birth: December 13, 1940   Medicare Important Message Given:  Yes    Kerin Salen 05/08/2018, 11:38 AMImportant Message  Patient Details  Name: RONIYAH LLORENS MRN: [id number] Date of Birth: 1940/10/28   Medicare Important Message Given:  Yes    Kerin Salen 05/08/2018, 11:38 AM

## 2018-05-08 NOTE — Consult Note (Signed)
   [facility] CM Inpatient Consult   05/08/2018  Melinda Larsen May 18, 1940 [id number]    [facility] CM Follow up:  Patient chart has been reviewed for readmissions less than 30 days and for high risk score, 23%, for unplanned readmissions. Referral had been placed for assessment for community needs.  Chart review reveals current disposition plan is for SNF, [name]. No [facility] CM needs at this time.  [name], MSN, [facility] Liaison Nurse Mobile Phone [phone]  Toll free office [phone]

## 2018-05-08 NOTE — Progress Notes (Signed)
I have spoken with pt's PCP, nursing staff, reviewed patient's chart. They have a clear alternative diagnosis and COVID precautions are being d/c.  covid testing negative

## 2018-05-08 NOTE — Progress Notes (Signed)
Report called to Blanch Media at [facility]. Awaiting IV team to deaccess PAC. Patient stable and awaiting discharge.   [place] 101P.

## 2018-05-08 NOTE — Progress Notes (Signed)
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    PT Comments    Pt c/o not feeling well on today. She is weak and fatigues easily. Continue to recommend SNF rehab.    Follow Up Recommendations  SNF     Equipment Recommendations  3in1 (PT)    Recommendations for Other Services       Precautions / Restrictions Precautions Precautions: Fall Restrictions Weight Bearing Restrictions: No    Mobility  Bed Mobility Overal bed mobility: Needs Assistance Bed Mobility: Supine to Sit;Sit to Supine     Supine to sit: Min assist Sit to supine: Min assist   General bed mobility comments: assist for trunk to sit up and light assistance for legs for back to bed  Transfers Overall transfer level: Needs assistance Equipment used: Rolling walker (2 wheeled) Transfers: Sit to/from Stand Sit to Stand: Min assist         General transfer comment: Assist to rise, stabilize, control descent. VCs safety, technique, hand placement.   Ambulation/Gait             General Gait Details: NT-pt too fatigued to attemtp on today   Stairs             Wheelchair Mobility    Modified Rankin (Stroke Patients Only)       Balance Overall balance assessment: Needs assistance         Standing balance support: Bilateral upper extremity supported Standing balance-Leahy Scale: Poor                              Cognition Arousal/Alertness: Awake/alert Behavior During Therapy: WFL for tasks assessed/performed                                    General Comments: hx of mets to brain--pt is alert, oriented, follows commands consistently; unsure of PLOF as she said sister stays with her when son is not there and PT heard sister say over the phone that she is not consistently there      Exercises      General Comments        Pertinent Vitals/Pain Pain Assessment: No/denies pain    Home Living                      Prior Function            PT Goals (current goals can now be found in the care plan section) Progress towards PT goals: Progressing toward goals    Frequency    Min 3X/week      PT Plan Current plan remains appropriate    Co-evaluation              AM-PAC PT "6 Clicks" Mobility   Outcome Measure  Help needed turning from your back to your side while in a flat bed without using bedrails?: A Little Help needed moving from lying on your back to sitting on the side of a flat bed  without using bedrails?: A Little Help needed moving to and from a bed to a chair (including a wheelchair)?: A Little Help needed standing up from a chair using your arms (e.g., wheelchair or bedside chair)?: A Little Help needed to walk in hospital room?: A Little Help needed climbing 3-5 steps with a railing? : A Lot 6 Click Score: 17    End of Session   Activity Tolerance: Patient limited by fatigue Patient left: in bed;with call bell/phone within reach;with bed alarm set   PT Visit Diagnosis: Muscle weakness (generalized) (M62.81)     Time: 1100-1115 PT Time Calculation (min) (ACUTE ONLY): 15 min  Charges:  $Therapeutic Activity: 8-22 mins                       Weston Anna, [place] Pager: [phone] Office: [phone]

## 2018-05-10 LAB — CULTURE, BLOOD (ROUTINE X 2)
Culture: NO GROWTH
Culture: NO GROWTH
Special Requests: ADEQUATE
Special Requests: ADEQUATE

## 2018-05-12 ENCOUNTER — Encounter (HOSPITAL_COMMUNITY): Payer: Medicare HMO | Admitting: Dietician

## 2018-05-12 ENCOUNTER — Ambulatory Visit (HOSPITAL_COMMUNITY): Payer: Medicare HMO | Admitting: Hematology

## 2018-05-12 ENCOUNTER — Other Ambulatory Visit (HOSPITAL_COMMUNITY): Payer: Medicare HMO

## 2018-05-12 DIAGNOSIS — I10 Essential (primary) hypertension: Secondary | ICD-10-CM | POA: Diagnosis not present

## 2018-05-12 DIAGNOSIS — Z789 Other specified health status: Secondary | ICD-10-CM | POA: Diagnosis not present

## 2018-05-12 DIAGNOSIS — C3492 Malignant neoplasm of unspecified part of left bronchus or lung: Secondary | ICD-10-CM | POA: Diagnosis not present

## 2018-05-12 DIAGNOSIS — R64 Cachexia: Secondary | ICD-10-CM | POA: Diagnosis not present

## 2018-05-12 DIAGNOSIS — J188 Other pneumonia, unspecified organism: Secondary | ICD-10-CM | POA: Diagnosis not present

## 2018-05-12 DIAGNOSIS — D638 Anemia in other chronic diseases classified elsewhere: Secondary | ICD-10-CM | POA: Diagnosis not present

## 2018-05-12 DIAGNOSIS — D72828 Other elevated white blood cell count: Secondary | ICD-10-CM | POA: Diagnosis not present

## 2018-05-12 DIAGNOSIS — C7931 Secondary malignant neoplasm of brain: Secondary | ICD-10-CM | POA: Diagnosis not present

## 2018-05-12 DIAGNOSIS — C7951 Secondary malignant neoplasm of bone: Secondary | ICD-10-CM | POA: Diagnosis not present

## 2018-05-28 DIAGNOSIS — C7931 Secondary malignant neoplasm of brain: Secondary | ICD-10-CM | POA: Diagnosis not present

## 2018-05-28 DIAGNOSIS — C3492 Malignant neoplasm of unspecified part of left bronchus or lung: Secondary | ICD-10-CM | POA: Diagnosis not present

## 2018-05-28 DIAGNOSIS — I10 Essential (primary) hypertension: Secondary | ICD-10-CM | POA: Diagnosis not present

## 2018-05-28 DIAGNOSIS — D72828 Other elevated white blood cell count: Secondary | ICD-10-CM | POA: Diagnosis not present

## 2018-05-28 DIAGNOSIS — C7951 Secondary malignant neoplasm of bone: Secondary | ICD-10-CM | POA: Diagnosis not present

## 2018-05-28 DIAGNOSIS — J188 Other pneumonia, unspecified organism: Secondary | ICD-10-CM | POA: Diagnosis not present

## 2018-05-28 DIAGNOSIS — D638 Anemia in other chronic diseases classified elsewhere: Secondary | ICD-10-CM | POA: Diagnosis not present

## 2018-06-01 ENCOUNTER — Other Ambulatory Visit: Payer: Self-pay | Admitting: *Deleted

## 2018-06-01 ENCOUNTER — Other Ambulatory Visit: Payer: Self-pay | Admitting: Radiation Therapy

## 2018-06-01 ENCOUNTER — Telehealth: Payer: Self-pay | Admitting: Radiation Therapy

## 2018-06-01 DIAGNOSIS — C7931 Secondary malignant neoplasm of brain: Secondary | ICD-10-CM

## 2018-06-01 DIAGNOSIS — C7949 Secondary malignant neoplasm of other parts of nervous system: Principal | ICD-10-CM

## 2018-06-01 NOTE — Patient Outreach (Signed)
[facility]) Care Management  06/01/2018  Melinda Larsen [birth year] [id number]   Transition of Care Referral   Referral Date: 06/01/18 Referral Source:  [facility] inpatient referral  Date of Admission: 05/07/18 Diagnosis: suspected postobstructive PNA, stage IV lung cancer with brain and bone mets Date of Discharge: Facility:  [facility] on 05/28/18 Insurance: [employer]  Primary care Provider [place] attempt # 1 successful at the home number  Patient is able to verify HIPAA with some assistance. She repeated her date of birth for her address. She informs CM " I forget things: (Memory deficits) Reviewed and addressed Transitional of care referral with patient She reports her appetite is good  Transition of care assessment reviewed    [facility] RN CM called the primary Care provider's office to discuss need for snf f/u appointment  After multiple attempts without success at reaching someone at the MD office CM sent emails to MD, NPs and   CM forwarded this note to primary MD    Social: Melinda Larsen lives at home with her son, Melinda Larsen and has her sisters, Melinda Larsen and Melinda Larsen, who visit and provide transportation. 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, hypertension, DM, dyslipidemia, memory concerns, chronic anemia, recently hospitalized from 3/27-3/28 for post structures pneumonia, tobacco abuse   Falls x 2 since d/c from snf denies injury to head or other body part, reports fell when not using cane  CM encouraged her to seek medical services for any fall with head injury or fainting She voiced understanding    Medications: She denies concerns with taking medications as prescribed, affording medications, side effects of medications and questions about medications DME  Cane and glucose meter  Appointments: not f/u appointment and agrees to allow CM to  call the MD office to get one scheduled for her, prefers to be called vs visiting the office   Advance directives:  Consent: [facility] RN CM reviewed [facility] services with patient. Patient gave verbal consent for services. Advised patient that other post discharge calls may occur to assess how the patient is doing following the recent hospitalization. Patient voiced understanding and was appreciative of f/u call.  Plan: [facility] RN CM will close case at this time as patient has been assessed and no needs identified/needs resolved.   Pt encouraged to return a call to [place] CM prn  [facility] RN CM sent a successful outreach letter as discussed with [facility] brochure enclosed for review  Routed note to MD    [place]. Lavina Hamman, RN, BSN, [place] Management Care Coordinator Direct Number [phone] Mobile number [phone]  Main [facility] number [phone] Fax number [phone]

## 2018-06-01 NOTE — Telephone Encounter (Signed)
Left voicemail with the upcoming brain MRI and follow-up appointment to see Bryson Ha in May. I mentioned that the follow-up to review the scan results will probably be a phone visit rather than in person. I left my contact information for Ms. Rebman to call me back with any questions or concerns.   Mont Dutton R.T.(R)(T) Special Procedures Navigator

## 2018-06-02 ENCOUNTER — Other Ambulatory Visit: Payer: Self-pay | Admitting: *Deleted

## 2018-06-02 DIAGNOSIS — I10 Essential (primary) hypertension: Secondary | ICD-10-CM | POA: Diagnosis not present

## 2018-06-02 DIAGNOSIS — R2681 Unsteadiness on feet: Secondary | ICD-10-CM | POA: Diagnosis not present

## 2018-06-02 DIAGNOSIS — C7931 Secondary malignant neoplasm of brain: Secondary | ICD-10-CM | POA: Diagnosis not present

## 2018-06-02 DIAGNOSIS — E441 Mild protein-calorie malnutrition: Secondary | ICD-10-CM | POA: Diagnosis not present

## 2018-06-02 DIAGNOSIS — F419 Anxiety disorder, unspecified: Secondary | ICD-10-CM | POA: Diagnosis not present

## 2018-06-02 DIAGNOSIS — F17210 Nicotine dependence, cigarettes, uncomplicated: Secondary | ICD-10-CM | POA: Diagnosis not present

## 2018-06-02 DIAGNOSIS — E1149 Type 2 diabetes mellitus with other diabetic neurological complication: Secondary | ICD-10-CM | POA: Diagnosis not present

## 2018-06-02 DIAGNOSIS — C7951 Secondary malignant neoplasm of bone: Secondary | ICD-10-CM | POA: Diagnosis not present

## 2018-06-02 DIAGNOSIS — C3491 Malignant neoplasm of unspecified part of right bronchus or lung: Secondary | ICD-10-CM | POA: Diagnosis not present

## 2018-06-02 NOTE — Patient Outreach (Addendum)
[facility]) Care Management  06/02/2018  CHERONDA ERCK [birth year] [id number]   Care coordination  [facility] RN CM spoke with Dr Nolon Rod receptionist to attempt to request a teleconference appointment for this patient. CM informed pt needs to have a computer or to come in the office to sit in the car and have someone come out to evaluate her.  CM encouraged the staff to speak with one of the nurses at the office for a teleconference or call the patient to schedule the appointment as this pt would need to get assistance from her sister to get to the appointment Pt with dx of stage IV lung cancer with mets to brain and bones. Receptionist confirms she will call the patient  Plan case clousre  Torez Beauregard L. Lavina Hamman, RN, BSN, [place] Coordinator Office number [phone] Mobile number [phone]  Main [facility] number [phone] Fax number [phone]

## 2018-06-03 DIAGNOSIS — C7951 Secondary malignant neoplasm of bone: Secondary | ICD-10-CM | POA: Diagnosis not present

## 2018-06-03 DIAGNOSIS — R630 Anorexia: Secondary | ICD-10-CM | POA: Diagnosis not present

## 2018-06-03 DIAGNOSIS — R2681 Unsteadiness on feet: Secondary | ICD-10-CM | POA: Diagnosis not present

## 2018-06-03 DIAGNOSIS — G8929 Other chronic pain: Secondary | ICD-10-CM | POA: Diagnosis not present

## 2018-06-03 DIAGNOSIS — E1149 Type 2 diabetes mellitus with other diabetic neurological complication: Secondary | ICD-10-CM | POA: Diagnosis not present

## 2018-06-03 DIAGNOSIS — C7931 Secondary malignant neoplasm of brain: Secondary | ICD-10-CM | POA: Diagnosis not present

## 2018-06-03 DIAGNOSIS — E441 Mild protein-calorie malnutrition: Secondary | ICD-10-CM | POA: Diagnosis not present

## 2018-06-03 DIAGNOSIS — I10 Essential (primary) hypertension: Secondary | ICD-10-CM | POA: Diagnosis not present

## 2018-06-03 DIAGNOSIS — C3491 Malignant neoplasm of unspecified part of right bronchus or lung: Secondary | ICD-10-CM | POA: Diagnosis not present

## 2018-06-03 DIAGNOSIS — F17210 Nicotine dependence, cigarettes, uncomplicated: Secondary | ICD-10-CM | POA: Diagnosis not present

## 2018-06-03 DIAGNOSIS — F419 Anxiety disorder, unspecified: Secondary | ICD-10-CM | POA: Diagnosis not present

## 2018-06-03 DIAGNOSIS — Z681 Body mass index (BMI) 19 or less, adult: Secondary | ICD-10-CM | POA: Diagnosis not present

## 2018-06-05 DIAGNOSIS — C3491 Malignant neoplasm of unspecified part of right bronchus or lung: Secondary | ICD-10-CM | POA: Diagnosis not present

## 2018-06-05 DIAGNOSIS — R2681 Unsteadiness on feet: Secondary | ICD-10-CM | POA: Diagnosis not present

## 2018-06-05 DIAGNOSIS — C7931 Secondary malignant neoplasm of brain: Secondary | ICD-10-CM | POA: Diagnosis not present

## 2018-06-05 DIAGNOSIS — F419 Anxiety disorder, unspecified: Secondary | ICD-10-CM | POA: Diagnosis not present

## 2018-06-05 DIAGNOSIS — C7951 Secondary malignant neoplasm of bone: Secondary | ICD-10-CM | POA: Diagnosis not present

## 2018-06-05 DIAGNOSIS — F17210 Nicotine dependence, cigarettes, uncomplicated: Secondary | ICD-10-CM | POA: Diagnosis not present

## 2018-06-05 DIAGNOSIS — E1149 Type 2 diabetes mellitus with other diabetic neurological complication: Secondary | ICD-10-CM | POA: Diagnosis not present

## 2018-06-05 DIAGNOSIS — E441 Mild protein-calorie malnutrition: Secondary | ICD-10-CM | POA: Diagnosis not present

## 2018-06-05 DIAGNOSIS — I10 Essential (primary) hypertension: Secondary | ICD-10-CM | POA: Diagnosis not present

## 2018-06-06 ENCOUNTER — Emergency Department (HOSPITAL_COMMUNITY): Payer: Medicare HMO

## 2018-06-06 ENCOUNTER — Other Ambulatory Visit: Payer: Self-pay

## 2018-06-06 ENCOUNTER — Encounter (HOSPITAL_COMMUNITY): Payer: Self-pay | Admitting: Emergency Medicine

## 2018-06-06 ENCOUNTER — Inpatient Hospital Stay (HOSPITAL_COMMUNITY)
Admission: EM | Admit: 2018-06-06 | Discharge: 2018-06-11 | DRG: 193 | Disposition: A | Discharge status: Hospice | Payer: Medicare HMO | Attending: Internal Medicine | Admitting: Internal Medicine

## 2018-06-06 DIAGNOSIS — R0902 Hypoxemia: Secondary | ICD-10-CM | POA: Diagnosis not present

## 2018-06-06 DIAGNOSIS — C779 Secondary and unspecified malignant neoplasm of lymph node, unspecified: Secondary | ICD-10-CM | POA: Diagnosis present

## 2018-06-06 DIAGNOSIS — J189 Pneumonia, unspecified organism: Principal | ICD-10-CM | POA: Diagnosis present

## 2018-06-06 DIAGNOSIS — T402X5A Adverse effect of other opioids, initial encounter: Secondary | ICD-10-CM | POA: Diagnosis present

## 2018-06-06 DIAGNOSIS — R531 Weakness: Secondary | ICD-10-CM | POA: Diagnosis not present

## 2018-06-06 DIAGNOSIS — R079 Chest pain, unspecified: Secondary | ICD-10-CM | POA: Diagnosis not present

## 2018-06-06 DIAGNOSIS — E43 Unspecified severe protein-calorie malnutrition: Secondary | ICD-10-CM | POA: Diagnosis not present

## 2018-06-06 DIAGNOSIS — L89152 Pressure ulcer of sacral region, stage 2: Secondary | ICD-10-CM | POA: Diagnosis present

## 2018-06-06 DIAGNOSIS — C7951 Secondary malignant neoplasm of bone: Secondary | ICD-10-CM | POA: Diagnosis present

## 2018-06-06 DIAGNOSIS — I10 Essential (primary) hypertension: Secondary | ICD-10-CM | POA: Diagnosis not present

## 2018-06-06 DIAGNOSIS — Z90710 Acquired absence of both cervix and uterus: Secondary | ICD-10-CM

## 2018-06-06 DIAGNOSIS — Z720 Tobacco use: Secondary | ICD-10-CM | POA: Diagnosis not present

## 2018-06-06 DIAGNOSIS — D630 Anemia in neoplastic disease: Secondary | ICD-10-CM | POA: Diagnosis present

## 2018-06-06 DIAGNOSIS — C801 Malignant (primary) neoplasm, unspecified: Secondary | ICD-10-CM

## 2018-06-06 DIAGNOSIS — E876 Hypokalemia: Secondary | ICD-10-CM

## 2018-06-06 DIAGNOSIS — Z9221 Personal history of antineoplastic chemotherapy: Secondary | ICD-10-CM

## 2018-06-06 DIAGNOSIS — K5900 Constipation, unspecified: Secondary | ICD-10-CM | POA: Diagnosis not present

## 2018-06-06 DIAGNOSIS — C3480 Malignant neoplasm of overlapping sites of unspecified bronchus and lung: Secondary | ICD-10-CM | POA: Diagnosis not present

## 2018-06-06 DIAGNOSIS — R404 Transient alteration of awareness: Secondary | ICD-10-CM | POA: Diagnosis not present

## 2018-06-06 DIAGNOSIS — Z515 Encounter for palliative care: Secondary | ICD-10-CM | POA: Diagnosis not present

## 2018-06-06 DIAGNOSIS — Z20828 Contact with and (suspected) exposure to other viral communicable diseases: Secondary | ICD-10-CM | POA: Diagnosis not present

## 2018-06-06 DIAGNOSIS — Z7401 Bed confinement status: Secondary | ICD-10-CM | POA: Diagnosis not present

## 2018-06-06 DIAGNOSIS — R197 Diarrhea, unspecified: Secondary | ICD-10-CM | POA: Diagnosis present

## 2018-06-06 DIAGNOSIS — E875 Hyperkalemia: Secondary | ICD-10-CM | POA: Diagnosis not present

## 2018-06-06 DIAGNOSIS — E782 Mixed hyperlipidemia: Secondary | ICD-10-CM | POA: Diagnosis not present

## 2018-06-06 DIAGNOSIS — R112 Nausea with vomiting, unspecified: Secondary | ICD-10-CM | POA: Diagnosis present

## 2018-06-06 DIAGNOSIS — J90 Pleural effusion, not elsewhere classified: Secondary | ICD-10-CM | POA: Diagnosis not present

## 2018-06-06 DIAGNOSIS — C7931 Secondary malignant neoplasm of brain: Secondary | ICD-10-CM | POA: Diagnosis present

## 2018-06-06 DIAGNOSIS — Z79899 Other long term (current) drug therapy: Secondary | ICD-10-CM

## 2018-06-06 DIAGNOSIS — Z7982 Long term (current) use of aspirin: Secondary | ICD-10-CM

## 2018-06-06 DIAGNOSIS — Z66 Do not resuscitate: Secondary | ICD-10-CM | POA: Diagnosis present

## 2018-06-06 DIAGNOSIS — K5903 Drug induced constipation: Secondary | ICD-10-CM | POA: Diagnosis present

## 2018-06-06 DIAGNOSIS — Z87891 Personal history of nicotine dependence: Secondary | ICD-10-CM

## 2018-06-06 DIAGNOSIS — R0789 Other chest pain: Secondary | ICD-10-CM | POA: Diagnosis present

## 2018-06-06 DIAGNOSIS — C3490 Malignant neoplasm of unspecified part of unspecified bronchus or lung: Secondary | ICD-10-CM | POA: Diagnosis not present

## 2018-06-06 DIAGNOSIS — Z7189 Other specified counseling: Secondary | ICD-10-CM

## 2018-06-06 DIAGNOSIS — R1084 Generalized abdominal pain: Secondary | ICD-10-CM

## 2018-06-06 DIAGNOSIS — R109 Unspecified abdominal pain: Secondary | ICD-10-CM | POA: Diagnosis present

## 2018-06-06 DIAGNOSIS — R5381 Other malaise: Secondary | ICD-10-CM | POA: Diagnosis not present

## 2018-06-06 DIAGNOSIS — J9601 Acute respiratory failure with hypoxia: Secondary | ICD-10-CM | POA: Diagnosis not present

## 2018-06-06 DIAGNOSIS — C7989 Secondary malignant neoplasm of other specified sites: Secondary | ICD-10-CM | POA: Diagnosis not present

## 2018-06-06 DIAGNOSIS — R7303 Prediabetes: Secondary | ICD-10-CM | POA: Diagnosis present

## 2018-06-06 DIAGNOSIS — Z8249 Family history of ischemic heart disease and other diseases of the circulatory system: Secondary | ICD-10-CM

## 2018-06-06 DIAGNOSIS — C3492 Malignant neoplasm of unspecified part of left bronchus or lung: Secondary | ICD-10-CM | POA: Diagnosis present

## 2018-06-06 DIAGNOSIS — Z8701 Personal history of pneumonia (recurrent): Secondary | ICD-10-CM

## 2018-06-06 DIAGNOSIS — Z681 Body mass index (BMI) 19 or less, adult: Secondary | ICD-10-CM | POA: Diagnosis not present

## 2018-06-06 DIAGNOSIS — Z885 Allergy status to narcotic agent status: Secondary | ICD-10-CM

## 2018-06-06 DIAGNOSIS — J432 Centrilobular emphysema: Secondary | ICD-10-CM | POA: Diagnosis present

## 2018-06-06 DIAGNOSIS — K5909 Other constipation: Secondary | ICD-10-CM | POA: Diagnosis not present

## 2018-06-06 LAB — COMPREHENSIVE METABOLIC PANEL
ALT: 9 U/L (ref 0–44)
AST: 18 U/L (ref 15–41)
Albumin: 2.1 g/dL — ABNORMAL LOW (ref 3.5–5.0)
Alkaline Phosphatase: 64 U/L (ref 38–126)
Anion gap: 11 (ref 5–15)
BUN: 11 mg/dL (ref 8–23)
CO2: 27 mmol/L (ref 22–32)
Calcium: 7.6 mg/dL — ABNORMAL LOW (ref 8.9–10.3)
Chloride: 101 mmol/L (ref 98–111)
Creatinine, Ser: 0.41 mg/dL — ABNORMAL LOW (ref 0.44–1.00)
GFR calc Af Amer: >60 mL/min (ref >60)
GFR calc non Af Amer: >60 mL/min (ref >60)
Glucose, Bld: 150 mg/dL — ABNORMAL HIGH (ref 70–99)
Potassium: 2.8 mmol/L — ABNORMAL LOW (ref 3.5–5.1)
Sodium: 139 mmol/L (ref 135–145)
Total Bilirubin: 0.8 mg/dL (ref 0.3–1.2)
Total Protein: 6.6 g/dL (ref 6.5–8.1)

## 2018-06-06 LAB — CBC
HCT: 33.3 % — ABNORMAL LOW (ref 36.0–46.0)
Hemoglobin: 9.3 g/dL — ABNORMAL LOW (ref 12.0–15.0)
MCH: 24.4 pg — ABNORMAL LOW (ref 26.0–34.0)
MCHC: 27.9 g/dL — ABNORMAL LOW (ref 30.0–36.0)
MCV: 87.4 fL (ref 80.0–100.0)
Platelets: 262 10*3/uL (ref 150–400)
RBC: 3.81 MIL/uL — ABNORMAL LOW (ref 3.87–5.11)
RDW: 18.7 % — ABNORMAL HIGH (ref 11.5–15.5)
WBC: 16.1 10*3/uL — ABNORMAL HIGH (ref 4.0–10.5)
nRBC: 0 % (ref 0.0–0.2)

## 2018-06-06 LAB — TROPONIN I: Troponin I: 0.03 ng/mL (ref <0.03)

## 2018-06-06 LAB — LIPASE, BLOOD: Lipase: 25 U/L (ref 11–51)

## 2018-06-06 LAB — MAGNESIUM: Magnesium: 1.3 mg/dL — ABNORMAL LOW (ref 1.7–2.4)

## 2018-06-06 LAB — SARS CORONAVIRUS 2 BY RT PCR (HOSPITAL ORDER, PERFORMED IN ~~LOC~~ HOSPITAL LAB): SARS Coronavirus 2: NEGATIVE

## 2018-06-06 MED ORDER — POTASSIUM CHLORIDE 10 MEQ/100ML IV SOLN
10.0000 meq | Freq: Once | INTRAVENOUS | Status: AC
  Administered 2018-06-06: 12:00:00 10 meq via INTRAVENOUS
  Filled 2018-06-06: qty 100

## 2018-06-06 MED ORDER — TRAZODONE HCL 50 MG PO TABS
25.0000 mg | ORAL_TABLET | Freq: Every evening | ORAL | Status: DC | PRN
  Administered 2018-06-08 – 2018-06-09 (×2): 25 mg via ORAL
  Filled 2018-06-06 (×2): qty 1

## 2018-06-06 MED ORDER — AMOXICILLIN-POT CLAVULANATE 875-125 MG PO TABS
1.0000 | ORAL_TABLET | Freq: Two times a day (BID) | ORAL | Status: AC
  Administered 2018-06-06 – 2018-06-07 (×2): 1 via ORAL
  Filled 2018-06-06 (×2): qty 1

## 2018-06-06 MED ORDER — ENSURE ENLIVE PO LIQD
237.0000 mL | Freq: Three times a day (TID) | ORAL | Status: DC
  Administered 2018-06-06 – 2018-06-09 (×7): 237 mL via ORAL

## 2018-06-06 MED ORDER — ACETAMINOPHEN 325 MG PO TABS: 650.0000 mg | ORAL_TABLET | Freq: Four times a day (QID) | ORAL | Status: DC | PRN

## 2018-06-06 MED ORDER — AMLODIPINE BESYLATE 5 MG PO TABS
5.0000 mg | ORAL_TABLET | Freq: Every evening | ORAL | Status: DC
  Administered 2018-06-07 – 2018-06-08 (×2): 5 mg via ORAL
  Filled 2018-06-06 (×2): qty 1

## 2018-06-06 MED ORDER — OXYCODONE HCL 5 MG PO TABS
5.0000 mg | ORAL_TABLET | ORAL | Status: DC | PRN
  Administered 2018-06-08 – 2018-06-09 (×2): 5 mg via ORAL
  Filled 2018-06-06 (×4): qty 1

## 2018-06-06 MED ORDER — FLEET ENEMA 7-19 GM/118ML RE ENEM: 1.0000 | ENEMA | Freq: Once | RECTAL | Status: DC | PRN

## 2018-06-06 MED ORDER — ONDANSETRON HCL 4 MG PO TABS: 4.0000 mg | ORAL_TABLET | Freq: Four times a day (QID) | ORAL | Status: DC | PRN

## 2018-06-06 MED ORDER — MAGNESIUM SULFATE 4 GM/100ML IV SOLN
4.0000 g | Freq: Once | INTRAVENOUS | Status: DC
  Filled 2018-06-06: qty 100

## 2018-06-06 MED ORDER — DRONABINOL 5 MG PO CAPS
5.0000 mg | ORAL_CAPSULE | Freq: Two times a day (BID) | ORAL | Status: DC
  Administered 2018-06-07 – 2018-06-09 (×4): 5 mg via ORAL
  Filled 2018-06-06 (×3): qty 1
  Filled 2018-06-06: qty 2

## 2018-06-06 MED ORDER — HYDROCODONE-ACETAMINOPHEN 5-325 MG PO TABS
2.0000 | ORAL_TABLET | Freq: Four times a day (QID) | ORAL | Status: DC | PRN
  Administered 2018-06-06 – 2018-06-08 (×5): 2 via ORAL
  Filled 2018-06-06 (×5): qty 2

## 2018-06-06 MED ORDER — IPRATROPIUM-ALBUTEROL 20-100 MCG/ACT IN AERS: 1.0000 | INHALATION_SPRAY | Freq: Four times a day (QID) | RESPIRATORY_TRACT | Status: DC | PRN

## 2018-06-06 MED ORDER — ACETAMINOPHEN 650 MG RE SUPP: 650.0000 mg | Freq: Four times a day (QID) | RECTAL | Status: DC | PRN

## 2018-06-06 MED ORDER — PANTOPRAZOLE SODIUM 40 MG PO TBEC
40.0000 mg | DELAYED_RELEASE_TABLET | Freq: Every day | ORAL | Status: DC
  Administered 2018-06-07 – 2018-06-10 (×4): 40 mg via ORAL
  Filled 2018-06-06 (×4): qty 1

## 2018-06-06 MED ORDER — MIRTAZAPINE 15 MG PO TABS
15.0000 mg | ORAL_TABLET | Freq: Every day | ORAL | Status: DC
  Administered 2018-06-06 – 2018-06-10 (×5): 15 mg via ORAL
  Filled 2018-06-06 (×5): qty 1

## 2018-06-06 MED ORDER — HYDROCODONE-ACETAMINOPHEN 5-325 MG PO TABS: 1.0000 | ORAL_TABLET | Freq: Four times a day (QID) | ORAL | Status: DC | PRN

## 2018-06-06 MED ORDER — IPRATROPIUM-ALBUTEROL 0.5-2.5 (3) MG/3ML IN SOLN
3.0000 mL | Freq: Four times a day (QID) | RESPIRATORY_TRACT | Status: DC | PRN
  Administered 2018-06-08: 3 mL via RESPIRATORY_TRACT
  Filled 2018-06-06: qty 3

## 2018-06-06 MED ORDER — POTASSIUM CHLORIDE IN NACL 40-0.9 MEQ/L-% IV SOLN
INTRAVENOUS | Status: DC
  Administered 2018-06-07: 13:00:00 60 mL/h via INTRAVENOUS
  Administered 2018-06-08: 21:00:00 50 mL/h via INTRAVENOUS
  Administered 2018-06-08: 06:00:00 60 mL/h via INTRAVENOUS
  Filled 2018-06-06 (×4): qty 1000

## 2018-06-06 MED ORDER — POTASSIUM CHLORIDE CRYS ER 20 MEQ PO TBCR
40.0000 meq | EXTENDED_RELEASE_TABLET | Freq: Once | ORAL | Status: DC
  Filled 2018-06-06: qty 2

## 2018-06-06 MED ORDER — ENOXAPARIN SODIUM 40 MG/0.4ML ~~LOC~~ SOLN
40.0000 mg | SUBCUTANEOUS | Status: DC
  Administered 2018-06-06 – 2018-06-08 (×3): 40 mg via SUBCUTANEOUS
  Filled 2018-06-06 (×3): qty 0.4

## 2018-06-06 MED ORDER — ONDANSETRON HCL 4 MG/2ML IJ SOLN: 4.0000 mg | Freq: Four times a day (QID) | INTRAMUSCULAR | Status: DC | PRN

## 2018-06-06 MED ORDER — POLYETHYLENE GLYCOL 3350 17 G PO PACK
17.0000 g | PACK | Freq: Two times a day (BID) | ORAL | Status: DC
  Administered 2018-06-06 – 2018-06-10 (×7): 17 g via ORAL
  Filled 2018-06-06 (×8): qty 1

## 2018-06-06 MED ORDER — ASPIRIN EC 81 MG PO TBEC
81.0000 mg | DELAYED_RELEASE_TABLET | Freq: Every evening | ORAL | Status: DC
  Administered 2018-06-07 – 2018-06-08 (×2): 81 mg via ORAL
  Filled 2018-06-06 (×2): qty 1

## 2018-06-06 NOTE — Progress Notes (Signed)
06/06/2018 2:15 PM  I spoke with patient's sister Bertram Millard about patient refusing care in the hospital.  She says that son has gone to work but will let him know.  Pt apparently has appt with Dr. Delton Coombes on Monday.  Will make him aware that she is here.  Because patient is refusing treatments, will try to obtain palliative consult on Monday if available.   Murvin Natal MD

## 2018-06-06 NOTE — ED Provider Notes (Signed)
[facility] EMERGENCY DEPARTMENT Provider Note   CSN: [id number] Arrival date & time: 06/06/18  4401    History   Chief Complaint Chief Complaint  Patient presents with   Chest Pain   Abdominal Pain    HPI Melinda Larsen is a 78 y.o. female.     HPI Patient presents with pain.  States it is in her abdomen and chest.  Recently got out of [facility].  States it is just dull pain.  She is on some pain medicines at home.  Has a history of recent healthcare associated pneumonia and known metastatic cancer.  Cancer is a lung primary but is stage IV.  Has had some nausea and vomiting and diarrhea.  Last bowel movement was around 2 to 3 days ago. Past Medical History:  Diagnosis Date   Anemia    Borderline diabetes    Dyspnea    Hyperlipidemia    Hypertension    Lung cancer ([place]) 12/23/2017   Metastasis to bone ([place]) 03/24/2018   Pneumonia    Pre-diabetes     Patient Active Problem List   Diagnosis Date Noted   HCAP (healthcare-associated pneumonia) 05/05/2018   Pressure injury of skin 05/03/2018   Tobacco abuse 05/02/2018   Adenocarcinoma ([place]) metastatic of lung 05/02/2018   Non-small cell lung cancer (NSCLC) ([place]) 05/02/2018   Post-obstructive pneumonia due to foreign body aspiration 05/02/2018   Postobstructive pneumonia 05/02/2018   Metastasis to bone ([place]) 03/24/2018   Brain metastasis ([place]) 03/10/2018   Lung cancer ([place]) 12/23/2017   Special screening for malignant neoplasms, colon     Past Surgical History:  Procedure Laterality Date   ABDOMINAL HYSTERECTOMY     APPENDECTOMY     COLONOSCOPY N/A 07/16/2017   Procedure: COLONOSCOPY;  Surgeon: Danie Binder, MD;  [facility];  Service: Endoscopy;  Laterality: N/A;  10:30   IR IMAGING GUIDED PORT INSERTION  12/24/2017   POLYPECTOMY  07/16/2017   Procedure: POLYPECTOMY;  Surgeon: Danie Binder, MD;  Location: [facility];  Service: Endoscopy;;  cecal  x2;hepatic flexurex5; splenic x1;descending x2   VIDEO BRONCHOSCOPY WITH ENDOBRONCHIAL ULTRASOUND N/A 12/17/2017   Procedure: VIDEO BRONCHOSCOPY WITH ENDOBRONCHIAL ULTRASOUND;  Surgeon: Melrose Nakayama, MD;  Location: [facility];  Service: Thoracic;  Laterality: N/A;     OB History   No obstetric history on file.      Home Medications    Prior to Admission medications   Medication Sig Start Date End Date Taking? Authorizing Provider  amLODipine (NORVASC) 5 MG tablet Take 5 mg by mouth every evening.    Yes [provider]  aspirin EC 81 MG tablet Take 81 mg by mouth every evening.    Yes [provider]  dexamethasone (DECADRON) 4 MG tablet Take 1 tab two times a day the day before [place] chemo, then take 2 tabs once a day for 3 days starting the day after chemo. 03/30/18  Yes Higgs, Mathis Dad, MD  dronabinol (MARINOL) 5 MG capsule Take 5 mg by mouth 2 (two) times daily before a meal.   Yes [provider]  HYDROcodone-acetaminophen (NORCO/VICODIN) 5-325 MG tablet Take 1 tablet by mouth every 6 (six) hours as needed for moderate pain. 1 or 2 tabs PO q4 hours prn pain 05/08/18  Yes Domenic Polite, MD  mirtazapine (REMERON) 15 MG tablet Take 1 tablet (15 mg total) by mouth at bedtime. 04/21/18  Yes Derek Jack, MD  benzonatate (TESSALON PERLES) 100  MG capsule Take 1 capsule (100 mg total) by mouth every 6 (six) hours as needed for cough. Patient not taking: Reported on 06/06/2018 05/03/18 05/03/19  Heath Lark D, DO  feeding supplement, ENSURE ENLIVE, (ENSURE ENLIVE) LIQD Take 237 mLs by mouth 3 (three) times daily between meals. Patient not taking: Reported on 06/06/2018 05/03/18   Heath Lark D, DO  Ipratropium-Albuterol (COMBIVENT) 20-100 MCG/ACT AERS respimat Inhale 1 puff into the lungs every 6 (six) hours as needed for up to 30 days for wheezing or shortness of breath. 05/03/18 06/02/18  Heath Lark D, DO    Family History Family History  Problem Relation  Age of Onset   Hypertension Mother    Hypertension Father    Colon cancer Neg Hx    Colon polyps Neg Hx     Social History Social History   Tobacco Use   Smoking status: Former Smoker    Packs/day: 1.00    Years: 40.00    Pack years: 40.00    Types: Cigarettes    Last attempt to quit: 12/09/2012    Years since quitting: 5.4   Smokeless tobacco: Never Used   Tobacco comment: off and on since her teens  Substance Use Topics   Alcohol use: No   Drug use: No     Allergies   Codeine   Review of Systems Review of Systems  Constitutional: Positive for appetite change. Negative for chills and fever.  Respiratory: Negative for shortness of breath.   Cardiovascular: Positive for chest pain.  Gastrointestinal: Positive for abdominal pain, constipation, diarrhea, nausea and vomiting.  Genitourinary: Positive for flank pain.  Musculoskeletal: Negative for back pain.  Skin: Negative for rash.  Neurological: Positive for weakness.  Psychiatric/Behavioral: Negative for confusion.     Physical Exam Updated Vital Signs BP (!) 146/73    Pulse 73    Temp 97.9 F (36.6 C) (Oral)    Resp 16    Wt 48.9 kg    SpO2 96%    BMI 15.47 kg/m   Physical Exam Vitals signs and nursing note reviewed.  Constitutional:      Comments: Patient appears chronically ill.  HENT:     Head: Normocephalic.  Cardiovascular:     Rate and Rhythm: Normal rate.  Pulmonary:     Breath sounds: No wheezing, rhonchi or rales.  Chest:     Chest wall: No tenderness.  Abdominal:     Comments: No real tenderness but may feel little full.  Musculoskeletal:     Right lower leg: No edema.     Left lower leg: No edema.  Skin:    General: Skin is warm.     Capillary Refill: Capillary refill takes less than 2 seconds.  Neurological:     Comments: Patient is awake and appears appropriate      ED Treatments / Results  Labs (all labs ordered are listed, but only abnormal results are  displayed) Labs Reviewed  COMPREHENSIVE METABOLIC PANEL - Abnormal; Notable for the following components:      Result Value   Potassium 2.8 (*)    Glucose, Bld 150 (*)    Creatinine, Ser 0.41 (*)    Calcium 7.6 (*)    Albumin 2.1 (*)    All other components within normal limits  CBC - Abnormal; Notable for the following components:   WBC 16.1 (*)    RBC 3.81 (*)    Hemoglobin 9.3 (*)    HCT 33.3 (*)  MCH 24.4 (*)    MCHC 27.9 (*)    RDW 18.7 (*)    All other components within normal limits  TROPONIN I - Abnormal; Notable for the following components:   Troponin I 0.03 (*)    All other components within normal limits  SARS CORONAVIRUS 2 (HOSPITAL ORDER, [place])  LIPASE, BLOOD  URINALYSIS, ROUTINE W REFLEX MICROSCOPIC    EKG EKG Interpretation  Date/Time:  Saturday Jun 06 2018 09:06:16 EDT Ventricular Rate:  82 PR Interval:    QRS Duration: 141 QT Interval:  423 QTC Calculation: 495 R Axis:   116 Text Interpretation:  Sinus rhythm RBBB and LPFB Baseline wander in lead(s) II III aVR aVF V1 Confirmed by Davonna Belling [phone]) on 06/06/2018 10:04:07 AM   Radiology Dg Abd Acute 2+v W 1v Chest  Result Date: 06/06/2018 CLINICAL DATA:  Abdominal pain.  Chest pain.  Lung cancer. EXAM: DG ABDOMEN ACUTE W/ 1V CHEST COMPARISON:  05/01/2018 chest radiograph. FINDINGS: Right internal jugular Port-A-Cath terminates at the cavoatrial junction. Stable cardiomediastinal silhouette with mild cardiomegaly. No pneumothorax. No right pleural effusion. Possible small left pleural effusion. Worsening patchy consolidation throughout the left lung with associated volume loss. Clear right lung. No disproportionately dilated small bowel loops. Prominent stool and gas throughout the colon. No evidence of pneumatosis or pneumoperitoneum. No radiopaque nephrolithiasis. IMPRESSION: 1. Worsening patchy opacity throughout the left lung with associated volume loss, which could  be due to any combination of postobstructive pneumonia, atelectasis and/or tumor. 2. Possible small left pleural effusion. 3. Nonobstructive bowel gas pattern. 4. Prominent stool and gas throughout the colon, which may indicate constipation. Electronically Signed   By: Ilona Sorrel M.D.   On: 06/06/2018 10:20    Procedures Procedures (including critical care time)  Medications Ordered in ED Medications - No data to display   Initial Impression / Assessment and Plan / ED Course  I have reviewed the triage vital signs and the nursing notes.  Pertinent labs & imaging results that were available during my care of the patient were reviewed by me and considered in my medical decision making (see chart for details).        Patient presents with a few different complaints.  Chest abdominal pain.  Appears to be somewhat worse in the left side.  X-ray shows some constipation.  White count is elevated but improved from recent visit to hospital.  Has a hypokalemia and hypocalcemia.  X-ray shows worsening findings on her left lung.  Is really not had worsening cough or sputum production.  Does have a known obstructive mass on that side.  Could just be symptomatic from that obstruction.  However now has a new oxygen requirement.  Sats will go into the 80s at rest without oxygen.  Does not have oxygen at home.  With worsening symptoms and now at home will discuss with hospitalist about admission to hospital.  With new hospital policy will also add covid testing.  Final Clinical Impressions(s) / ED Diagnoses   Final diagnoses:  Malignant neoplasm of lung, unspecified laterality, unspecified part of lung ([place])  Hypoxia  Hypokalemia  Hypocalcemia  Constipation, unspecified constipation type    ED Discharge Orders    None       Davonna Belling, MD 06/06/18 1106

## 2018-06-06 NOTE — Progress Notes (Signed)
Spoke with patient's sister Melinda Larsen with an updated plan of care. Pt still refusing care at the moment. Only thing she wanted was a sip of water and a soda. Will continue to monitor.

## 2018-06-06 NOTE — ED Notes (Signed)
ED TO INPATIENT HANDOFF REPORT  ED Nurse Name and Phone #: Gaspar Bidding RN  S Name/Age/Gender Melinda Larsen 78 y.o. female Room/Bed: APA14/APA14  Code Status   Code Status: DNR  Home/SNF/Other Home Patient oriented to: self, place, time and situation Is this baseline? Yes   Triage Complete: Triage complete  Chief Complaint abdominal pain  Triage Note Pt started to have abd and chest pains this morning she has not had a bm in 2 weeks. She just got out of [facility].   Allergies Allergies  Allergen Reactions  . Codeine Nausea Only and Other (See Comments)    Nervous/sweating    Level of Care/Admitting Diagnosis ED Disposition    ED Disposition Condition [place] [facility]: [facility] [[id number]]  Level of Care: Med-Surg [16]  Covid Evaluation: N/A  Diagnosis: Constipation [[id number]]  Admitting Physician: [place], [place]  Attending Physician: Murlean Iba [[id number]]  Estimated length of stay: past midnight tomorrow  Certification:: I certify this patient will need inpatient services for at least 2 midnights  PT Class (Do Not Modify): Inpatient [101]  PT Acc Code (Do Not Modify): Private [1]       B Medical/Surgery History Past Medical History:  Diagnosis Date  . Anemia   . Borderline diabetes   . Dyspnea   . Hyperlipidemia   . Hypertension   . Lung cancer ([place]) 12/23/2017  . 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 Pneumonia   . Pre-diabetes    Past Surgical History:  Procedure Laterality Date  . ABDOMINAL HYSTERECTOMY    . APPENDECTOMY    . COLONOSCOPY N/A 07/16/2017   Procedure: COLONOSCOPY;  Surgeon: Danie Binder, MD;  [facility];  Service: Endoscopy;  Laterality: N/A;  10:30  . IR IMAGING GUIDED PORT INSERTION  12/24/2017  . POLYPECTOMY  07/16/2017   Procedure: POLYPECTOMY;  Surgeon: Danie Binder, MD;  [facility];  Service: Endoscopy;;  cecal x2;hepatic flexurex5; splenic x1;descending x2   . VIDEO BRONCHOSCOPY WITH ENDOBRONCHIAL ULTRASOUND N/A 12/17/2017   Procedure: VIDEO BRONCHOSCOPY WITH ENDOBRONCHIAL ULTRASOUND;  Surgeon: Melrose Nakayama, MD;  Location: [facility];  Service: Thoracic;  Laterality: N/A;     A IV Location/Drains/Wounds Patient Lines/Drains/Airways Status   Active Line/Drains/Airways    Name:   Placement date:   Placement time:   Site:   Days:   Implanted Port 12/24/17 Left   12/24/17    1227    -   164   Peripheral IV 06/06/18 Right Antecubital   06/06/18    0913    Antecubital   less than 1   Pressure Injury 05/02/18 Stage II -  Partial thickness loss of dermis presenting as a shallow open ulcer with a red, pink wound bed without slough. pink/red open wound with shallow open bed. approimately .5"x.5"   05/02/18    2300     35   Pressure Injury 05/05/18 Stage III -  Full thickness tissue loss. Subcutaneous fat may be visible but bone, tendon or muscle are NOT exposed.   05/05/18    2143     32   Pressure Injury 05/05/18 Stage III -  Full thickness tissue loss. Subcutaneous fat may be visible but bone, tendon or muscle are NOT exposed.   05/05/18    2143     32          Intake/Output Last 24 hours No intake or output data in the  24 hours ending 06/06/18 1211  Labs/Imaging Results for orders placed or performed during the hospital encounter of 06/06/18 (from the past 48 hour(s))  Comprehensive metabolic panel     Status: Abnormal   Collection Time: 06/06/18  9:32 AM  Result Value Ref Range   Sodium 139 135 - 145 mmol/L   Potassium 2.8 (L) 3.5 - 5.1 mmol/L   Chloride 101 98 - 111 mmol/L   CO2 27 22 - 32 mmol/L   Glucose, Bld 150 (H) 70 - 99 mg/dL   BUN 11 8 - 23 mg/dL   Creatinine, Ser 0.41 (L) 0.44 - 1.00 mg/dL   Calcium 7.6 (L) 8.9 - 10.3 mg/dL   Total Protein 6.6 6.5 - 8.1 g/dL   Albumin 2.1 (L) 3.5 - 5.0 g/dL   AST 18 15 - 41 U/L   ALT 9 0 - 44 U/L   Alkaline Phosphatase 64 38 - 126 U/L   Total Bilirubin 0.8 0.3 - 1.2 mg/dL   GFR calc non  Af Amer >60 >60 mL/min   GFR calc Af Amer >60 >60 mL/min   Anion gap 11 5 - 15    Comment: Performed at [facility], [address] [place], [place] [address]  Lipase, blood     Status: None   Collection Time: 06/06/18  9:32 AM  Result Value Ref Range   Lipase 25 11 - 51 U/L    Comment: Performed at [facility], [address], [place], [place] [address]  CBC     Status: Abnormal   Collection Time: 06/06/18  9:32 AM  Result Value Ref Range   WBC 16.1 (H) 4.0 - 10.5 K/uL   RBC 3.81 (L) 3.87 - 5.11 MIL/uL   Hemoglobin 9.3 (L) 12.0 - 15.0 g/dL   HCT 33.3 (L) 36.0 - 46.0 %   MCV 87.4 80.0 - 100.0 fL   MCH 24.4 (L) 26.0 - 34.0 pg   MCHC 27.9 (L) 30.0 - 36.0 g/dL   RDW 18.7 (H) 11.5 - 15.5 %   Platelets 262 150 - 400 K/uL   nRBC 0.0 0.0 - 0.2 %    Comment: Performed at [facility], [address], [place], [place] [address]  Troponin I - Once     Status: Abnormal   Collection Time: 06/06/18  9:32 AM  Result Value Ref Range   Troponin I 0.03 (HH) <0.03 ng/mL    Comment: CRITICAL RESULT CALLED TO, READ BACK BY AND VERIFIED WITH: Ayyan Sites,B AT 1021 ON 06/06/2018 BY JPM Performed at [facility], [address], [place], [place] [address]   Magnesium     Status: Abnormal   Collection Time: 06/06/18  9:32 AM  Result Value Ref Range   Magnesium 1.3 (L) 1.7 - 2.4 mg/dL    Comment: Performed at [facility], [address] [place], [place] [address]   Dg Abd Acute 2+v W 1v Chest  Result Date: 06/06/2018 CLINICAL DATA:  Abdominal pain.  Chest pain.  Lung cancer. EXAM: DG ABDOMEN ACUTE W/ 1V CHEST COMPARISON:  05/01/2018 chest radiograph. FINDINGS: Right internal jugular Port-A-Cath terminates at the cavoatrial junction. Stable cardiomediastinal silhouette with mild cardiomegaly. No pneumothorax. No right pleural effusion. Possible small left pleural effusion. Worsening patchy consolidation throughout the left lung with associated volume loss. Clear right lung. No disproportionately dilated small  bowel loops. Prominent stool and gas throughout the colon. No evidence of pneumatosis or pneumoperitoneum. No radiopaque nephrolithiasis. IMPRESSION: 1. Worsening patchy opacity throughout the left lung with  associated volume loss, which could be due to any combination of postobstructive pneumonia, atelectasis and/or tumor. 2. Possible small left pleural effusion. 3. Nonobstructive bowel gas pattern. 4. Prominent stool and gas throughout the colon, which may indicate constipation. Electronically Signed   By: Ilona Sorrel M.D.   On: 06/06/2018 10:20    Pending Labs Unresulted Labs (From admission, onward)    Start     Ordered   06/26/18 0500  Creatinine, serum  (enoxaparin (LOVENOX)    CrCl >/= 30 ml/min)  Weekly,   R    Comments:  while on enoxaparin therapy    06/06/18 1135   06/07/18 0500  Comprehensive metabolic panel  Tomorrow morning,   R     06/06/18 1135   06/07/18 0500  CBC WITH DIFFERENTIAL  Tomorrow morning,   R     06/06/18 1135   06/07/18 0500  Magnesium  Tomorrow morning,   R     06/06/18 1135   06/06/18 1104  SARS Coronavirus 2 (CEPHEID- Performed in [facility] hospital lab), Hosp Order  (Symptomatic Patients Labs with Precautions )  Once,   R     06/06/18 1104   06/06/18 0911  Urinalysis, Routine w reflex microscopic  ONCE - STAT,   STAT     06/06/18 0910          Vitals/Pain Today's Vitals   06/06/18 1027 06/06/18 1030 06/06/18 1100 06/06/18 1130  BP: (!) 146/73 134/65 (!) 141/63 140/66  Pulse: 73 76  77  Resp: 16 17 (!) 21 18  Temp:      TempSrc:      SpO2: 96% 98%  98%  Weight:      PainSc:        Isolation Precautions No active isolations  Medications Medications  potassium chloride 10 mEq in 100 mL IVPB (10 mEq Intravenous New Bag/Given 06/06/18 1135)  feeding supplement (ENSURE ENLIVE) (ENSURE ENLIVE) liquid 237 mL (has no administration in time range)  aspirin EC tablet 81 mg (has no administration in time range)  amLODipine (NORVASC) tablet 5 mg  (has no administration in time range)  mirtazapine (REMERON) tablet 15 mg (has no administration in time range)  dronabinol (MARINOL) capsule 5 mg (has no administration in time range)  Ipratropium-Albuterol (COMBIVENT) respimat 1 puff (has no administration in time range)  enoxaparin (LOVENOX) injection 40 mg (has no administration in time range)  0.9 % NaCl with KCl 40 mEq / L  infusion (has no administration in time range)  acetaminophen (TYLENOL) tablet 650 mg (has no administration in time range)    Or  acetaminophen (TYLENOL) suppository 650 mg (has no administration in time range)  traZODone (DESYREL) tablet 25 mg (has no administration in time range)  sodium phosphate (FLEET) 7-19 GM/118ML enema 1 enema (has no administration in time range)  polyethylene glycol (MIRALAX / GLYCOLAX) packet 17 g (has no administration in time range)  ondansetron (ZOFRAN) tablet 4 mg (has no administration in time range)    Or  ondansetron (ZOFRAN) injection 4 mg (has no administration in time range)  pantoprazole (PROTONIX) EC tablet 40 mg (has no administration in time range)  amoxicillin-clavulanate (AUGMENTIN) 875-125 MG per tablet 1 tablet (has no administration in time range)  potassium chloride SA (K-DUR) CR tablet 40 mEq (has no administration in time range)  HYDROcodone-acetaminophen (NORCO/VICODIN) 5-325 MG per tablet 2 tablet (has no administration in time range)  oxyCODONE (Oxy IR/ROXICODONE) immediate release tablet 5 mg (has no administration in  time range)    Mobility walks with person assist Moderate fall risk   Focused Assessments Cardiac Assessment Handoff:  Cardiac Rhythm: Normal sinus rhythm Lab Results  Component Value Date   TROPONINI 0.03 ([place]) 06/06/2018   No results found for: DDIMER Does the Patient currently have chest pain? No     R Recommendations: See Admitting Provider Note  Report given to:   Additional Notes: History of lung cancer with masts to  skeletal system. Currently being treated with chemo. Implanted power port left.

## 2018-06-06 NOTE — H&P (Signed)
History and Physical  Melinda Larsen FYB:[id number] DOB: September 15, 1940 DOA: 06/06/2018  Referring physician: Alvino Chapel  PCP: Redmond School, MD   Chief Complaint: chest and abdominal pain  HPI: Melinda Larsen is a 78 y.o. female who is currently being treated for stage IV metastatic lung cancer who was recently hospitalized approximately 1 month ago with a postobstructive pneumonia and was subsequently discharged to [facility] skilled nursing facility has been discharged from the facility and has been home.  She presented to the emergency department today complaining of left-sided chest wall pain and abdominal pain.  She reports that she has not had a formed bowel movement in more than 2 weeks.  She is also had progressive weakness and shortness of breath.  She has had nausea vomiting and diarrhea.  The patient reports that she has been having flank pain.  She has had poor appetite and general malaise.  X-rays of the abdomen reveal constipation.  The chest x-ray shows worsening left lung findings.  She was noted to be hypoxic and her O2 sats remained in the 80s without oxygen at rest.  She was tested for COVID-19 which tested negative.  She was found to have some electrolyte abnormalities with hypokalemia and hypocalcemia.  She is being admitted for further management.  Review of Systems: All systems reviewed and apart from history of presenting illness, are negative.  Past Medical History:  Diagnosis Date  . Anemia   . Borderline diabetes   . Dyspnea   . Hyperlipidemia   . Hypertension   . Lung cancer ([place]) 12/23/2017  . 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 Pneumonia   . Pre-diabetes    Past Surgical History:  Procedure Laterality Date  . ABDOMINAL HYSTERECTOMY    . APPENDECTOMY    . COLONOSCOPY N/A 07/16/2017   Procedure: COLONOSCOPY;  Surgeon: Danie Binder, MD;  [facility];  Service: Endoscopy;  Laterality: N/A;  10:30  . IR IMAGING GUIDED PORT INSERTION   12/24/2017  . POLYPECTOMY  07/16/2017   Procedure: POLYPECTOMY;  Surgeon: Danie Binder, MD;  Location: [facility];  Service: Endoscopy;;  cecal x2;hepatic flexurex5; splenic x1;descending x2  . VIDEO BRONCHOSCOPY WITH ENDOBRONCHIAL ULTRASOUND N/A 12/17/2017   Procedure: VIDEO BRONCHOSCOPY WITH ENDOBRONCHIAL ULTRASOUND;  Surgeon: Melrose Nakayama, MD;  Location: [facility];  Service: Thoracic;  Laterality: N/A;   Social History:  reports that she quit smoking about 5 years ago. Her smoking use included cigarettes. She has a 40.00 pack-year smoking history. She has never used smokeless tobacco. She reports that she does not drink alcohol or use drugs.  Allergies  Allergen Reactions  . Codeine Nausea Only and Other (See Comments)    Nervous/sweating    Family History  Problem Relation Age of Onset  . Hypertension Mother   . Hypertension Father   . Colon cancer Neg Hx   . Colon polyps Neg Hx     Prior to Admission medications   Medication Sig Start Date End Date Taking? Authorizing Provider  amLODipine (NORVASC) 5 MG tablet Take 5 mg by mouth every evening.    Yes [provider]  aspirin EC 81 MG tablet Take 81 mg by mouth every evening.    Yes [provider]  dexamethasone (DECADRON) 4 MG tablet Take 1 tab two times a day the day before [place] chemo, then take 2 tabs once a day for 3 days starting the day after chemo. 03/30/18  Yes Higgs, Mathis Dad,  MD  dronabinol (MARINOL) 5 MG capsule Take 5 mg by mouth 2 (two) times daily before a meal.   Yes [provider]  HYDROcodone-acetaminophen (NORCO/VICODIN) 5-325 MG tablet Take 1 tablet by mouth every 6 (six) hours as needed for moderate pain. 1 or 2 tabs PO q4 hours prn pain 05/08/18  Yes Domenic Polite, MD  mirtazapine (REMERON) 15 MG tablet Take 1 tablet (15 mg total) by mouth at bedtime. 04/21/18  Yes Derek Jack, MD  benzonatate (TESSALON PERLES) 100 MG capsule Take 1 capsule (100 mg total) by mouth  every 6 (six) hours as needed for cough. Patient not taking: Reported on 06/06/2018 05/03/18 05/03/19  Heath Lark D, DO  feeding supplement, ENSURE ENLIVE, (ENSURE ENLIVE) LIQD Take 237 mLs by mouth 3 (three) times daily between meals. Patient not taking: Reported on 06/06/2018 05/03/18   Heath Lark D, DO  Ipratropium-Albuterol (COMBIVENT) 20-100 MCG/ACT AERS respimat Inhale 1 puff into the lungs every 6 (six) hours as needed for up to 30 days for wheezing or shortness of breath. 05/03/18 06/02/18  Heath Lark D, DO   Physical Exam: Vitals:   06/06/18 0900 06/06/18 0930 06/06/18 1000 06/06/18 1027  BP: 129/61 (!) 117/58  (!) 146/73  Pulse: 80 77 75 73  Resp: 14 16 17 16   Temp:      TempSrc:      SpO2: 91% (!) 89% 90% 96%  Weight:         General exam: Patient appears chronically ill emaciated, lying comfortably supine on the gurney in no obvious distress.  Head, eyes and ENT: Nontraumatic and normocephalic. Pupils equally reacting to light and accommodation. Oral mucosa dry.  Neck: Supple. No JVD, carotid bruit or thyromegaly.  Lymphatics: No lymphadenopathy.  Respiratory system: Diminished breath sounds left upper lobe, no increased work of breathing.  Cardiovascular system: S1 and S2 heard, RRR. No JVD, murmurs, gallops, clicks or pedal edema.  Gastrointestinal system: Abdomen is mildly distended, soft and with nonspecific tenderness. Normal bowel sounds heard. No organomegaly or masses appreciated.  Central nervous system: Alert and oriented. No focal neurological deficits.  Extremities: Symmetric 5 x 5 power. Peripheral pulses symmetrically felt.   Skin: No rashes or acute findings.  Musculoskeletal system: Negative exam.  Psychiatry: Pleasant and cooperative.  Labs on Admission:  Basic Metabolic Panel: Recent Labs  Lab 06/06/18 0932  NA 139  K 2.8*  CL 101  CO2 27  GLUCOSE 150*  BUN 11  CREATININE 0.41*  CALCIUM 7.6*   Liver Function Tests: Recent Labs  Lab  06/06/18 0932  AST 18  ALT 9  ALKPHOS 64  BILITOT 0.8  PROT 6.6  ALBUMIN 2.1*   Recent Labs  Lab 06/06/18 0932  LIPASE 25   No results for input(s): AMMONIA in the last 168 hours. CBC: Recent Labs  Lab 06/06/18 0932  WBC 16.1*  HGB 9.3*  HCT 33.3*  MCV 87.4  PLT 262   Cardiac Enzymes: Recent Labs  Lab 06/06/18 0932  TROPONINI 0.03*    BNP (last 3 results) No results for input(s): PROBNP in the last 8760 hours. CBG: No results for input(s): GLUCAP in the last 168 hours.  Radiological Exams on Admission: Dg Abd Acute 2+v W 1v Chest  Result Date: 06/06/2018 CLINICAL DATA:  Abdominal pain.  Chest pain.  Lung cancer. EXAM: DG ABDOMEN ACUTE W/ 1V CHEST COMPARISON:  05/01/2018 chest radiograph. FINDINGS: Right internal jugular Port-A-Cath terminates at the cavoatrial junction. Stable cardiomediastinal silhouette with mild cardiomegaly.  No pneumothorax. No right pleural effusion. Possible small left pleural effusion. Worsening patchy consolidation throughout the left lung with associated volume loss. Clear right lung. No disproportionately dilated small bowel loops. Prominent stool and gas throughout the colon. No evidence of pneumatosis or pneumoperitoneum. No radiopaque nephrolithiasis. IMPRESSION: 1. Worsening patchy opacity throughout the left lung with associated volume loss, which could be due to any combination of postobstructive pneumonia, atelectasis and/or tumor. 2. Possible small left pleural effusion. 3. Nonobstructive bowel gas pattern. 4. Prominent stool and gas throughout the colon, which may indicate constipation. Electronically Signed   By: Ilona Sorrel M.D.   On: 06/06/2018 10:20   Assessment/Plan Active Problems:   Constipation   1. Abdominal pain- likely secondary to chronic constipation from opioids for pain management.  The patient has been disimpacted and ordered to receive an enema and MiraLAX 17 g twice daily. 2. Progressive left lung findings-this  likely is the cause of her left-sided chest pain as the tumor continues to worsen.  There is some concern for residual postobstructive pneumonia.  I will treat her with Augmentin 875 mg twice daily. 3. Hypoxia- likely secondary to worsening malignancy of the left lung and possible postobstructive pneumonia and now patient is requiring supplemental oxygen.  Continue this supplemental oxygen and likely patient will discharge with supplemental oxygen. 4. Hypokalemia-we will treat with IV and oral potassium replacement and follow renal function panel and magnesium levels. 5. Leukocytosis-her WBC actually has been trending down since it was last tested on 05/07/2018.  She had receive Neupogen after chemotherapy.  Her mild WBC elevation now is likely secondary to cancer and postobstructive pneumonia. 6. DNR will continue DNR orders while in hospital per patient's wishes. 7. Anemia associated with neoplastic disease-we will follow closely and transfuse as needed. 8. 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 Tomie China office with oncology and will make arrangements for her to have close follow-up with the oncology clinic.  Given that her x-ray appears to show advancement of disease I think that a palliative medicine consultation is in order.  I would like for them to see her on Monday if possible.  DVT Prophylaxis: Lovenox Code Status: DNR Family Communication: Patient updated at bedside Disposition Plan: Inpatient management  Time spent: 31 minutes  Bhavesh Vazquez Wynetta Emery, MD [facility] How to contact the [facility] Attending or Consulting provider [place] or covering provider during after hours [place], for this patient?  1. Check the care team in [facility] and look for a) attending/consulting [facility] provider listed and b) the [facility] team listed 2. Log into [email or web address] and use [place] universal password to access. If you do not have the password, please contact the hospital  operator. 3. Locate the [facility] provider you are looking for under [facility] and page to a number that you can be directly reached. 4. If you still have difficulty reaching the provider, please page the [facility] (Director on Call) for the Hospitalists listed on amion for assistance.

## 2018-06-06 NOTE — Progress Notes (Signed)
Pt A&O x4, refusing port access and IV at this time.  MD is aware.

## 2018-06-06 NOTE — Progress Notes (Signed)
Pt in obvious distress,refusing meds and has removed her IV.

## 2018-06-06 NOTE — ED Notes (Signed)
Date and time results received: 06/06/18 10:22 AM  (use smartphrase ".now" to insert current time)  Test: Troponin Critical Value: 0.03  Name of Provider Notified: Alvino Chapel  Orders Received? Or Actions Taken?: Orders Received - See Orders for details

## 2018-06-06 NOTE — ED Triage Notes (Signed)
Pt started to have abd and chest pains this morning she has not had a bm in 2 weeks. She just got out of [facility].

## 2018-06-07 LAB — COMPREHENSIVE METABOLIC PANEL
ALT: 7 U/L (ref 0–44)
AST: 18 U/L (ref 15–41)
Albumin: 1.9 g/dL — ABNORMAL LOW (ref 3.5–5.0)
Alkaline Phosphatase: 55 U/L (ref 38–126)
Anion gap: 13 (ref 5–15)
BUN: 12 mg/dL (ref 8–23)
CO2: 25 mmol/L (ref 22–32)
Calcium: 7.2 mg/dL — ABNORMAL LOW (ref 8.9–10.3)
Chloride: 103 mmol/L (ref 98–111)
Creatinine, Ser: 0.42 mg/dL — ABNORMAL LOW (ref 0.44–1.00)
GFR calc Af Amer: >60 mL/min (ref >60)
GFR calc non Af Amer: >60 mL/min (ref >60)
Glucose, Bld: 87 mg/dL (ref 70–99)
Potassium: 2.3 mmol/L — CL (ref 3.5–5.1)
Sodium: 141 mmol/L (ref 135–145)
Total Bilirubin: 0.6 mg/dL (ref 0.3–1.2)
Total Protein: 5.9 g/dL — ABNORMAL LOW (ref 6.5–8.1)

## 2018-06-07 LAB — CBC WITH DIFFERENTIAL/PLATELET
Abs Immature Granulocytes: 0.09 10*3/uL — ABNORMAL HIGH (ref 0.00–0.07)
Basophils Absolute: 0 10*3/uL (ref 0.0–0.1)
Basophils Relative: 0 %
Eosinophils Absolute: 0 10*3/uL (ref 0.0–0.5)
Eosinophils Relative: 0 %
HCT: 30.2 % — ABNORMAL LOW (ref 36.0–46.0)
Hemoglobin: 8.3 g/dL — ABNORMAL LOW (ref 12.0–15.0)
Immature Granulocytes: 1 %
Lymphocytes Relative: 5 %
Lymphs Abs: 0.7 10*3/uL (ref 0.7–4.0)
MCH: 24.1 pg — ABNORMAL LOW (ref 26.0–34.0)
MCHC: 27.5 g/dL — ABNORMAL LOW (ref 30.0–36.0)
MCV: 87.8 fL (ref 80.0–100.0)
Monocytes Absolute: 0.9 10*3/uL (ref 0.1–1.0)
Monocytes Relative: 6 %
Neutro Abs: 13 10*3/uL — ABNORMAL HIGH (ref 1.7–7.7)
Neutrophils Relative %: 88 %
Platelets: 235 10*3/uL (ref 150–400)
RBC: 3.44 MIL/uL — ABNORMAL LOW (ref 3.87–5.11)
RDW: 18.6 % — ABNORMAL HIGH (ref 11.5–15.5)
WBC: 14.7 10*3/uL — ABNORMAL HIGH (ref 4.0–10.5)
nRBC: 0 % (ref 0.0–0.2)

## 2018-06-07 LAB — MAGNESIUM: Magnesium: 1.1 mg/dL — ABNORMAL LOW (ref 1.7–2.4)

## 2018-06-07 MED ORDER — POTASSIUM CHLORIDE 10 MEQ/100ML IV SOLN
10.0000 meq | INTRAVENOUS | Status: AC
  Administered 2018-06-07 (×4): 10 meq via INTRAVENOUS
  Filled 2018-06-07: qty 100

## 2018-06-07 MED ORDER — MAGNESIUM SULFATE 4 GM/100ML IV SOLN
4.0000 g | Freq: Once | INTRAVENOUS | Status: AC
  Administered 2018-06-07: 4 g via INTRAVENOUS

## 2018-06-07 NOTE — Progress Notes (Signed)
Pt was willing and agreeable to let RN access her port this morning after explaining the importance of getting IV potassium and magnesium. MD made aware. Will continue to monitor.

## 2018-06-07 NOTE — Evaluation (Signed)
Physical Therapy Evaluation Patient Details Name: Melinda Larsen MRN: [id number] DOB: [birth year] Today's Date: 06/07/2018   History of Present Illness  Melinda Larsen is a 78 y.o. female who is currently being treated for stage IV metastatic lung cancer who was recently hospitalized approximately 1 month ago with a postobstructive pneumonia and was subsequently discharged to [facility] skilled nursing facility has been discharged from the facility and has been home.  She presented to the emergency department today complaining of left-sided chest wall pain and abdominal pain.  She reports that she has not had a formed bowel movement in more than 2 weeks.  She is also had progressive weakness and shortness of breath.  She has had nausea vomiting and diarrhea.  The patient reports that she has been having flank pain.  She has had poor appetite and general malaise.  X-rays of the abdomen reveal constipation.  The chest x-ray shows worsening left lung findings.  She was noted to be hypoxic and her O2 sats remained in the 80s without oxygen at rest.  She was tested for COVID-19 which tested negative.  She was found to have some electrolyte abnormalities with hypokalemia and hypocalcemia.  She is being admitted for further management.    Clinical Impression  Patient limited for functional mobility as stated below secondary to BLE weakness, fatigue and fair/poor standing balance.  Patient required verbal for proper hand placement during sit to stands, demonstrates slightly labored cadence during ambulation without loss of balance and tolerated sitting up in chair after therapy.  Patient states her family members work and are not available to assist her during most of day.  Patient will benefit from continued physical therapy in hospital and recommended venue below to increase strength, balance, endurance for safe ADLs and gait.    Follow Up Recommendations SNF;Supervision for  mobility/OOB;Supervision/Assistance - 24 hour    Equipment Recommendations       Recommendations for Other Services       Precautions / Restrictions Precautions Precautions: Fall Restrictions Weight Bearing Restrictions: No      Mobility  Bed Mobility Overal bed mobility: Needs Assistance Bed Mobility: Supine to Sit     Supine to sit: Min guard;Min assist     General bed mobility comments: labored movement, increased time  Transfers Overall transfer level: Needs assistance   Transfers: Sit to/from Stand;Stand Pivot Transfers Sit to Stand: Min assist Stand pivot transfers: Min assist       General transfer comment: slow labored movement, verbal/tactile cueing for proper hand placement during sit to stands with fair/good carryover  Ambulation/Gait Ambulation/Gait assistance: Min assist Gait Distance (Feet): 50 Feet Assistive device: Rolling walker (2 wheeled) Gait Pattern/deviations: Decreased step length - right;Decreased step length - left;Decreased stride length Gait velocity: decreased   General Gait Details: labored cadence without loss of balance, limited secondary to c/o fatigue  Stairs            Wheelchair Mobility    Modified Rankin (Stroke Patients Only)       Balance Overall balance assessment: Needs assistance Sitting-balance support: Feet supported;No upper extremity supported Sitting balance-Leahy Scale: Fair     Standing balance support: Bilateral upper extremity supported;During functional activity Standing balance-Leahy Scale: Fair Standing balance comment: using RW                             Pertinent Vitals/Pain Pain Assessment: No/denies pain    Home Living Family/patient  expects to be discharged to:: Private residence Living Arrangements: Children;Other relatives Available Help at Discharge: Available PRN/intermittently Type of Home: House Home Access: Ramped entrance     Home Layout: One level;Laundry  or work area in [place]: Environmental consultant - 2 wheels;Cane - single point;Shower seat;Bedside commode;Wheelchair - manual      Prior Function Level of Independence: Independent with assistive device(s)         Comments: household ambulator with RW     Hand Dominance        Extremity/Trunk Assessment   Upper Extremity Assessment Upper Extremity Assessment: Generalized weakness    Lower Extremity Assessment Lower Extremity Assessment: Generalized weakness    Cervical / Trunk Assessment Cervical / Trunk Assessment: Normal  Communication   Communication: No difficulties  Cognition Arousal/Alertness: Awake/alert Behavior During Therapy: WFL for tasks assessed/performed Overall Cognitive Status: Within Functional Limits for tasks assessed                                        General Comments      Exercises     Assessment/Plan    PT Assessment Patient needs continued PT services  PT Problem List Decreased strength;Decreased activity tolerance;Decreased balance;Decreased mobility       PT Treatment Interventions Therapeutic exercise;Gait training;Stair training;Functional mobility training;Therapeutic activities    PT Goals (Current goals can be found in the Care Plan section)  Acute Rehab PT Goals Patient Stated Goal: return home after rehab PT Goal Formulation: With patient Time For Goal Achievement: 06/21/18 Potential to Achieve Goals: Good    Frequency Min 3X/week   Barriers to discharge        Co-evaluation               AM-PAC PT "6 Clicks" Mobility  Outcome Measure Help needed turning from your back to your side while in a flat bed without using bedrails?: None Help needed moving from lying on your back to sitting on the side of a flat bed without using bedrails?: A Little Help needed moving to and from a bed to a chair (including a wheelchair)?: A Little Help needed standing up from a chair using your arms (e.g.,  wheelchair or bedside chair)?: A Little Help needed to walk in hospital room?: A Little Help needed climbing 3-5 steps with a railing? : A Lot 6 Click Score: 18    End of Session   Activity Tolerance: Patient tolerated treatment well;Patient limited by fatigue Patient left: in chair;with call bell/phone within reach Nurse Communication: Mobility status PT Visit Diagnosis: Unsteadiness on feet (R26.81);Other abnormalities of gait and mobility (R26.89);Muscle weakness (generalized) (M62.81)    Time: 5597-4163 PT Time Calculation (min) (ACUTE ONLY): 29 min   Charges:   PT Evaluation $PT Eval Moderate Complexity: 1 Mod PT Treatments $Therapeutic Activity: 23-37 mins        10:51 AM, 06/07/18 Lonell Grandchild, MPT Physical Therapist with [facility] 336 [phone] office [phone] mobile phone

## 2018-06-07 NOTE — Progress Notes (Signed)
As patient was walking to the bathroom she de-accessed her port. RN re-accessed port without difficulty. Will continue to monitor.

## 2018-06-07 NOTE — Progress Notes (Signed)
PROGRESS NOTE    Melinda Larsen  WYO:[id number]  DOB: May 13, 1940  DOA: 06/06/2018 PCP: Redmond School, MD   Brief Admission Hx: 78 year old female with stage IV metastatic lung cancer who had recently been hospitalized last month with a postobstructive pneumonia presented with left-sided chest wall pain and abdominal pain.  She was noted to have severe constipation in addition to findings on chest x-ray worrisome for worsening left lung tumor.  She initially refused care in the hospital however she eventually changed her mind.  Palliative medicine consult pending.  MDM/Assessment & Plan:   1. 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  The patient has been manually disimpacted and given an enema and now on twice daily MiraLAX therapy. 2. Progressive left lung findings- suspect this is the cause of her left-sided chest pain symptoms as her tumor burden appears to be worsening.  I have also been concerned about a postobstructive pneumonia and she is being treated with Augmentin 875 mg twice daily.  She initially refused to take any medications however today she has complied with taking some medications.  Palliative medicine consultation has been ordered as she has been refusing care. 3. Hypoxia-she does have a new oxygen requirement likely secondary to worsening tumor burden.  Continue supplemental oxygen. 4. Leukocytosis- her WBC is trending down with treatment.  Suspect she has a postobstructive pneumonia in addition to large tumor burden. 5. Anemia in neoplastic disease- her hemoglobin has declined likely from hemodilution however will follow closely. 6. Hypokalemia-patient had been refusing potassium however today she has agreed to take some.  Her magnesium is also being replaced. 7. Stage IV non-small cell lung cancer with metastases- patient Sister Bertram Millard reports that the patient was supposed to see Dr. Delton Coombes on Monday, 06/08/2018 in the afternoon.  I will  ask if he will see her in the hospital if possible.  I have requested a palliative medicine consultation for goals of care as patient has been refusing treatments in the hospital.  DVT prophylaxis: Lovenox Code Status: DNR Family Communication: Spoke with Wilsey telephone sister Disposition Plan: Continue inpatient management   Consultants:  N/A  Procedures:  N/A  Antimicrobials:  Augmentin 06/06/2018-  Subjective: Patient is sitting up in the chair and reports she feels better today.  Objective: Vitals:   06/06/18 2104 06/06/18 2106 06/07/18 0506 06/07/18 0903  BP:  131/70 (!) 132/58   Pulse: 94 95 86   Resp:      Temp:  98.3 F (36.8 C) 98.1 F (36.7 C)   TempSrc:  Oral Oral   SpO2: 98% 94% 99% 100%  Weight:      Height:        Intake/Output Summary (Last 24 hours) at 06/07/2018 1234 Last data filed at 06/06/2018 2115 Gross per 24 hour  Intake 460 ml  Output 650 ml  Net -190 ml   Filed Weights   06/06/18 0849 06/06/18 1315  Weight: 48.9 kg 47.4 kg     REVIEW OF SYSTEMS  As per history otherwise all reviewed and reported negative  Exam:  General exam: Emaciated chronically ill-appearing female sitting in a chair in no apparent distress. Respiratory system: Coarse breath sounds in the left upper lobe. No increased work of breathing. Cardiovascular system: S1 & S2 heard. No JVD, murmurs, gallops, clicks or pedal edema. Gastrointestinal system: Abdomen is nondistended, soft and nontender. Normal bowel sounds heard. Central nervous system: Alert and oriented. No focal neurological deficits. Extremities: no CCE.  Data Reviewed: Basic Metabolic  Panel: Recent Labs  Lab 06/06/18 0932 06/07/18 0557  NA 139 141  K 2.8* 2.3*  CL 101 103  CO2 27 25  GLUCOSE 150* 87  BUN 11 12  CREATININE 0.41* 0.42*  CALCIUM 7.6* 7.2*  MG 1.3* 1.1*   Liver Function Tests: Recent Labs  Lab 06/06/18 0932 06/07/18 0557  AST 18 18  ALT 9 7  ALKPHOS 64 55  BILITOT 0.8  0.6  PROT 6.6 5.9*  ALBUMIN 2.1* 1.9*   Recent Labs  Lab 06/06/18 0932  LIPASE 25   No results for input(s): AMMONIA in the last 168 hours. CBC: Recent Labs  Lab 06/06/18 0932 06/07/18 0557  WBC 16.1* 14.7*  NEUTROABS  --  13.0*  HGB 9.3* 8.3*  HCT 33.3* 30.2*  MCV 87.4 87.8  PLT 262 235   Cardiac Enzymes: Recent Labs  Lab 06/06/18 0932  TROPONINI 0.03*   CBG (last 3)  No results for input(s): GLUCAP in the last 72 hours. Recent Results (from the past 240 hour(s))  SARS Coronavirus 2 (CEPHEID- Performed in [place] hospital lab), Hosp Order     Status: None   Collection Time: 06/06/18 11:04 AM  Result Value Ref Range Status   SARS Coronavirus 2 NEGATIVE NEGATIVE Final    Comment: (NOTE) If result is NEGATIVE SARS-CoV-2 target nucleic acids are NOT DETECTED. The SARS-CoV-2 RNA is generally detectable in upper and lower  respiratory specimens during the acute phase of infection. The lowest  concentration of SARS-CoV-2 viral copies this assay can detect is 250  copies / mL. A negative result does not preclude SARS-CoV-2 infection  and should not be used as the sole basis for treatment or other  patient management decisions.  A negative result may occur with  improper specimen collection / handling, submission of specimen other  than nasopharyngeal swab, presence of viral mutation(s) within the  areas targeted by this assay, and inadequate number of viral copies  (<250 copies / mL). A negative result must be combined with clinical  observations, patient history, and epidemiological information. If result is POSITIVE SARS-CoV-2 target nucleic acids are DETECTED. The SARS-CoV-2 RNA is generally detectable in upper and lower  respiratory specimens dur ing the acute phase of infection.  Positive  results are indicative of active infection with SARS-CoV-2.  Clinical  correlation with patient history and other diagnostic information is  necessary to determine patient  infection status.  Positive results do  not rule out bacterial infection or co-infection with other viruses. If result is PRESUMPTIVE POSTIVE SARS-CoV-2 nucleic acids MAY BE PRESENT.   A presumptive positive result was obtained on the submitted specimen  and confirmed on repeat testing.  While 2019 novel coronavirus  (SARS-CoV-2) nucleic acids may be present in the submitted sample  additional confirmatory testing may be necessary for epidemiological  and / or clinical management purposes  to differentiate between  SARS-CoV-2 and other Sarbecovirus currently known to infect humans.  If clinically indicated additional testing with an alternate test  methodology [phone]) is advised. The SARS-CoV-2 RNA is generally  detectable in upper and lower respiratory sp ecimens during the acute  phase of infection. The expected result is Negative. Fact Sheet for Patients:  StrictlyIdeas.no Fact Sheet for Healthcare Providers: BankingDealers.co.za This test is not yet approved or cleared by the [place] FDA and has been authorized for detection and/or diagnosis of SARS-CoV-2 by FDA under an Emergency Use Authorization (EUA).  This EUA will remain in effect (meaning this test  can be used) for the duration of the COVID-19 declaration under Section 564(b)(1) of the Act, 21 U.S.C. section 360bbb-3(b)(1), unless the authorization is terminated or revoked sooner. Performed at [facility], [address] [place], [place] [address]      Studies: Dg Abd Acute 2+v W 1v Chest  Result Date: 06/06/2018 CLINICAL DATA:  Abdominal pain.  Chest pain.  Lung cancer. EXAM: DG ABDOMEN ACUTE W/ 1V CHEST COMPARISON:  05/01/2018 chest radiograph. FINDINGS: Right internal jugular Port-A-Cath terminates at the cavoatrial junction. Stable cardiomediastinal silhouette with mild cardiomegaly. No pneumothorax. No right pleural effusion. Possible small left pleural effusion.  Worsening patchy consolidation throughout the left lung with associated volume loss. Clear right lung. No disproportionately dilated small bowel loops. Prominent stool and gas throughout the colon. No evidence of pneumatosis or pneumoperitoneum. No radiopaque nephrolithiasis. IMPRESSION: 1. Worsening patchy opacity throughout the left lung with associated volume loss, which could be due to any combination of postobstructive pneumonia, atelectasis and/or tumor. 2. Possible small left pleural effusion. 3. Nonobstructive bowel gas pattern. 4. Prominent stool and gas throughout the colon, which may indicate constipation. Electronically Signed   By: Ilona Sorrel M.D.   On: 06/06/2018 10:20   Scheduled Meds: . amLODipine  5 mg Oral QPM  . amoxicillin-clavulanate  1 tablet Oral Q12H  . aspirin EC  81 mg Oral QPM  . dronabinol  5 mg Oral BID AC  . enoxaparin (LOVENOX) injection  40 mg Subcutaneous Q24H  . feeding supplement (ENSURE ENLIVE)  237 mL Oral TID BM  . mirtazapine  15 mg Oral QHS  . pantoprazole  40 mg Oral Q0600  . polyethylene glycol  17 g Oral BID  . potassium chloride  40 mEq Oral Once   Continuous Infusions: . 0.9 % NaCl with KCl 40 mEq / L    . potassium chloride 10 mEq (06/07/18 1150)    Principal Problem:   Constipation Active Problems:   Lung cancer ([place])   Brain metastasis (HCC)   Metastasis to bone (HCC)   Tobacco abuse   Adenocarcinoma (HCC) metastatic of lung   Non-small cell lung cancer (NSCLC) (HCC)   Postobstructive pneumonia   Abdominal pain   Chest wall pain  Time spent:   Irwin Brakeman, MD [facility] 06/07/2018, 12:34 PM    LOS: 1 day  How to contact the [facility] Attending or Consulting provider [place] or covering provider during after hours [place], for this patient?  1. Check the care team in [facility] and look for a) attending/consulting [facility] provider listed and b) the [facility] team listed 2. Log into [email or web address] and use [place] universal password to  access. If you do not have the password, please contact the hospital operator. 3. Locate the [facility] provider you are looking for under [facility] and page to a number that you can be directly reached. 4. If you still have difficulty reaching the provider, please page the [facility] (Director on Call) for the Hospitalists listed on amion for assistance.

## 2018-06-07 NOTE — Plan of Care (Signed)
  Problem: Acute Rehab PT Goals(only PT should resolve) Goal: Pt Will Go Supine/Side To Sit Outcome: Progressing Flowsheets (Taken 06/07/2018 1052) Pt will go Supine/Side to Sit: with supervision Goal: Patient Will Transfer Sit To/From Stand Outcome: Progressing Flowsheets (Taken 06/07/2018 1052) Patient will transfer sit to/from stand: with min guard assist Goal: Pt Will Transfer Bed To Chair/Chair To Bed Outcome: Progressing Flowsheets (Taken 06/07/2018 1052) Pt will Transfer Bed to Chair/Chair to Bed: min guard assist Goal: Pt Will Ambulate Outcome: Progressing Flowsheets (Taken 06/07/2018 1052) Pt will Ambulate: 75 feet; with min guard assist; with rolling walker   10:53 AM, 06/07/18 Lonell Grandchild, MPT Physical Therapist with [facility] [phone] office [phone] mobile phone

## 2018-06-08 ENCOUNTER — Ambulatory Visit (HOSPITAL_COMMUNITY): Payer: Medicare HMO | Admitting: Hematology

## 2018-06-08 ENCOUNTER — Other Ambulatory Visit (HOSPITAL_COMMUNITY): Payer: Medicare HMO

## 2018-06-08 DIAGNOSIS — E43 Unspecified severe protein-calorie malnutrition: Secondary | ICD-10-CM

## 2018-06-08 DIAGNOSIS — Z515 Encounter for palliative care: Secondary | ICD-10-CM

## 2018-06-08 DIAGNOSIS — C7951 Secondary malignant neoplasm of bone: Secondary | ICD-10-CM

## 2018-06-08 LAB — CBC
HCT: 30.2 % — ABNORMAL LOW (ref 36.0–46.0)
Hemoglobin: 8.4 g/dL — ABNORMAL LOW (ref 12.0–15.0)
MCH: 24.3 pg — ABNORMAL LOW (ref 26.0–34.0)
MCHC: 27.8 g/dL — ABNORMAL LOW (ref 30.0–36.0)
MCV: 87.3 fL (ref 80.0–100.0)
Platelets: 217 10*3/uL (ref 150–400)
RBC: 3.46 MIL/uL — ABNORMAL LOW (ref 3.87–5.11)
RDW: 18.6 % — ABNORMAL HIGH (ref 11.5–15.5)
WBC: 15.8 10*3/uL — ABNORMAL HIGH (ref 4.0–10.5)
nRBC: 0 % (ref 0.0–0.2)

## 2018-06-08 LAB — MAGNESIUM: Magnesium: 1.7 mg/dL (ref 1.7–2.4)

## 2018-06-08 LAB — RENAL FUNCTION PANEL
Albumin: 1.7 g/dL — ABNORMAL LOW (ref 3.5–5.0)
Anion gap: 8 (ref 5–15)
BUN: 14 mg/dL (ref 8–23)
CO2: 27 mmol/L (ref 22–32)
Calcium: 7.3 mg/dL — ABNORMAL LOW (ref 8.9–10.3)
Chloride: 105 mmol/L (ref 98–111)
Creatinine, Ser: 0.42 mg/dL — ABNORMAL LOW (ref 0.44–1.00)
GFR calc Af Amer: >60 mL/min (ref >60)
GFR calc non Af Amer: >60 mL/min (ref >60)
Glucose, Bld: 101 mg/dL — ABNORMAL HIGH (ref 70–99)
Phosphorus: 2.1 mg/dL — ABNORMAL LOW (ref 2.5–4.6)
Potassium: 3.8 mmol/L (ref 3.5–5.1)
Sodium: 140 mmol/L (ref 135–145)

## 2018-06-08 NOTE — Progress Notes (Signed)
Physical Therapy Treatment Patient Details Name: Melinda Larsen MRN: [id number] DOB: 1940/11/22 Today's Date: 06/08/2018    History of Present Illness Melinda Larsen is a 78 y.o. female who is currently being treated for stage IV metastatic lung cancer who was recently hospitalized approximately 1 month ago with a postobstructive pneumonia and was subsequently discharged to [facility] skilled nursing facility has been discharged from the facility and has been home.  She presented to the emergency department today complaining of left-sided chest wall pain and abdominal pain.  She reports that she has not had a formed bowel movement in more than 2 weeks.  She is also had progressive weakness and shortness of breath.  She has had nausea vomiting and diarrhea.  The patient reports that she has been having flank pain.  She has had poor appetite and general malaise.  X-rays of the abdomen reveal constipation.  The chest x-ray shows worsening left lung findings.  She was noted to be hypoxic and her O2 sats remained in the 80s without oxygen at rest.  She was tested for COVID-19 which tested negative.  She was found to have some electrolyte abnormalities with hypokalemia and hypocalcemia.  She is being admitted for further management.    PT Comments    Upon entrance pt willing to participate, able to recall name, DOB and aware of location.  Pt has removed O2 A via nasal cannula with decreased O2 saturation to 75%, able to return to 98% with return of nasal cannula.  Cueing for handplacement and scooting mechanics to assist with supine to sitting on EOB.  Min A with sit to stand from bed and cueing for handplacement.  Pt stated she needed to use restroom so ambulated short distance for restroom break.  Pt became fatigued and reported inability to ambulate back to bed/chair.  Mod A required with sit to stand from commade due to lower height and increased weakness/fatigue. Pt became slightly confused during this  time and required increased cueing for hand placement to assist and assistance.   Following restroom issue pt returned to bed with NP left in room.  Call bell and bed alarm set.  RN aware of status and pt. Removing O2 A from nose.  No reports of pain through session.    Follow Up Recommendations  SNF;Supervision for mobility/OOB;Supervision/Assistance - 24 hour     Equipment Recommendations       Recommendations for Other Services       Precautions / Restrictions Precautions Precautions: Fall Restrictions Weight Bearing Restrictions: No    Mobility  Bed Mobility Overal bed mobility: Needs Assistance Bed Mobility: Supine to Sit     Supine to sit: Min assist     General bed mobility comments: labored movement, increased time  Transfers Overall transfer level: Needs assistance   Transfers: Sit to/from Stand;Stand Pivot Transfers Sit to Stand: Min assist;Mod assist(Mod A with STS from toilet with cueing for hand placement) Stand pivot transfers: Min assist       General transfer comment: slow labored movement, verbal/tactile cueing for proper hand placement during sit to stands with fair/good carryover  Ambulation/Gait Ambulation/Gait assistance: Min assist Gait Distance (Feet): 12 Feet Assistive device: Rolling walker (2 wheeled) Gait Pattern/deviations: Decreased step length - right;Decreased step length - left;Decreased stride length     General Gait Details: labored cadence without loss of balance, limited secondary to c/o fatigue, required SPT and WC back to bed due to weakness, fatigue and confusion   Stairs  Wheelchair Mobility    Modified Rankin (Stroke Patients Only)       Balance                                            Cognition Arousal/Alertness: Awake/alert Behavior During Therapy: WFL for tasks assessed/performed Overall Cognitive Status: Within Functional Limits for tasks assessed                                  General Comments: able to state name, DOB, aware of location.  Pt removed O2 nasal cannula and had resting on chin upon therapist entrance with decreased O2 saturation to 75%, required education for purpose of nasal cannula.        Exercises      General Comments        Pertinent Vitals/Pain Pain Assessment: No/denies pain    Home Living                      Prior Function            PT Goals (current goals can now be found in the care plan section)      Frequency    Min 3X/week      PT Plan      Co-evaluation              AM-PAC PT "6 Clicks" Mobility   Outcome Measure  Help needed turning from your back to your side while in a flat bed without using bedrails?: None Help needed moving from lying on your back to sitting on the side of a flat bed without using bedrails?: A Little Help needed moving to and from a bed to a chair (including a wheelchair)?: A Little Help needed standing up from a chair using your arms (e.g., wheelchair or bedside chair)?: A Little Help needed to walk in hospital room?: A Little Help needed climbing 3-5 steps with a railing? : A Lot 6 Click Score: 18    End of Session Equipment Utilized During Treatment: Gait belt;Oxygen(2L O2 A) Activity Tolerance: Patient tolerated treatment well;Patient limited by fatigue Patient left: in bed;with call bell/phone within reach;with bed alarm set;with nursing/sitter in room(NP in room at EOS) Nurse Communication: Mobility status PT Visit Diagnosis: Unsteadiness on feet (R26.81);Other abnormalities of gait and mobility (R26.89);Muscle weakness (generalized) (M62.81)     Time: 4097-3532 PT Time Calculation (min) (ACUTE ONLY): 45 min  Charges:  $Therapeutic Activity: 38-52 mins                     [address] LPTA; CBIS [phone]   Aldona Lento 06/08/2018, 10:34 AM

## 2018-06-08 NOTE — Plan of Care (Signed)
  Problem: Health Behavior/Discharge Planning: Goal: Ability to manage health-related needs will improve Outcome: Not Progressing   Problem: Nutrition: Goal: Adequate nutrition will be maintained Outcome: Not Progressing   Problem: Pain Managment: Goal: General experience of comfort will improve Outcome: Not Progressing   Problem: Safety: Goal: Ability to remain free from injury will improve Outcome: Not Progressing   Problem: Skin Integrity: Goal: Risk for impaired skin integrity will decrease Outcome: Not Progressing

## 2018-06-08 NOTE — Consult Note (Signed)
Consultation Note Date: 06/08/2018   Patient Name: Melinda Larsen  DOB: 1940/10/26  MRN: [id number]  Age / Sex: 78 y.o., female  PCP: Redmond School, MD Referring Physician: Murlean Iba, MD  Reason for Consultation: Establishing goals of care  HPI/Patient Profile: 78 y.o. 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 Constipation is resolved but she is refusing treatment so palliative requested to discuss [place]. Oncology, Dr. Delton Coombes, also requested for consult.   Clinical Assessment and Goals of Care: I met today with Mardene Celeste. I assisted PT to get her from bathroom to bed. PT says that she walked well yesterday but today she does not even want to stand to assist and did not assist much. We had to use wheelchair to get her back to bed. Once back to bed I attempted to elicit [place] but unable to get much out of [place] except "I'm not doing good." She is unable to tell me she has lung cancer or pain and tells me the year is 26. She does know she is in the hospital. She is clearly confused as she kept pulling out her oxygen.   I called her sister, Ruby. Bertram Millard shares that her husband died from cancer years ago and she knows that Charna is declining and "giving up." Ruby says that Le's husband died ~1.5 yrs ago and she has one son who lives with her, [name]. [name] is the Air traffic controller. Bertram Millard is concerned that Tahira is home alone from 2-11 pm some days while her son works. Ruby understands that we may not have further treatment options and are awaiting oncology  input. Ruby agrees that hospice may be a better alternative for Mellisa but we will talk more with [name] about options.   I received a call back from son, [name]. [name] tells me that he has already spoken with a nurse (I am unsure who) that explained options as continuing chemo if able, pursuing any radiation options, or home with hospice. He tells me that he is hoping for the first 2 options. However, we gently discussed that her cancer continues to progress and she continues to worsen even with aggressive treatment. Discussed low albumin and malnutrition along with weakness and functional decline. I expressed concern of how she would be able to physically tolerate further treatment vs if this would only be harmful to her considering her disease continues to progress. [name] understands that she is most likely best suited for hospice at this stage. He is open to hospice but would like to have official recommendation from Dr. Delton Coombes.   All questions/concerns addressed. Emotional support provided. I will continue to follow and support.   Primary Decision Maker NEXT OF KIN son [name] (unable to answer his phone after  2-2:30p but will return calls as able)    [place] home with hospice  Code Status/Advance Care Planning:  DNR   Symptom Management:   Pain management: continue with prn Vicodin and titrate as needed.   Constipation: Miralax BID. Consider addition senokot 1 tablet qhs as I am unsure how likely she is to comply with Miralax BID and may become more difficult for her to take with poor intake and as she declines.   Palliative Prophylaxis:   Aspiration, Bowel Regimen, Delirium Protocol, Frequent Pain Assessment, Oral Care and Turn Reposition  Psycho-social/Spiritual:   Desire for further Chaplaincy support:yes  Additional Recommendations: Education on Hospice and Grief/Bereavement Support  Prognosis:   < 6 months  Discharge Planning: Home  with Hospice most likely     Primary Diagnoses: Present on Admission: . Constipation . Lung cancer ([place]) . Brain metastasis ([place]) . Metastasis to bone ([place]) . Tobacco abuse . Adenocarcinoma ([place]) metastatic of lung . Non-small cell lung cancer (NSCLC) ([place]) . Postobstructive pneumonia . Abdominal pain . Chest wall pain   I have reviewed the medical record, interviewed the patient and family, and examined the patient. The following aspects are pertinent.  Past Medical History:  Diagnosis Date  . Anemia   . Borderline diabetes   . Dyspnea   . Hyperlipidemia   . Hypertension   . Lung cancer ([place]) 12/23/2017  . Metastasis to bone ([place]) 03/24/2018  . Pneumonia   . Pre-diabetes    Social History   Socioeconomic History  . Marital status: Widowed    Spouse name: Not on file  . Number of children: Not on file  . Years of education: Not on file  . Highest education level: Not on file  Occupational History  . Not on file  Social Needs  . Financial resource strain: Patient refused  . Food insecurity:    Worry: Patient refused    Inability: Patient refused  . Transportation needs:    Medical: No    Non-medical: No  Tobacco Use  . Smoking status: Former Smoker    Packs/day: 1.00    Years: 40.00    Pack years: 40.00    Types: Cigarettes    Last attempt to quit: 12/09/2012    Years since quitting: 5.4  . Smokeless tobacco: Never Used  . Tobacco comment: off and on since her teens  Substance and Sexual Activity  . Alcohol use: No  . Drug use: No  . Sexual activity: Not on file  Lifestyle  . Physical activity:    Days per week: Patient refused    Minutes per session: Patient refused  . Stress: Patient refused  Relationships  . Social connections:    Talks on phone: Patient refused    Gets together: Patient refused    Attends religious service: Patient refused    Active member of club or organization: Patient refused    Attends meetings of clubs or  organizations: Patient refused    Relationship status: Patient refused  Other Topics Concern  . Not on file  Social History Narrative  . Not on file   Family History  Problem Relation Age of Onset  . Hypertension Mother   . Hypertension Father   . Colon cancer Neg Hx   . Colon polyps Neg Hx    Scheduled Meds: . amLODipine  5 mg Oral QPM  . aspirin EC  81 mg Oral QPM  . dronabinol  5 mg Oral BID AC  .  enoxaparin (LOVENOX) injection  40 mg Subcutaneous Q24H  . feeding supplement (ENSURE ENLIVE)  237 mL Oral TID BM  . mirtazapine  15 mg Oral QHS  . pantoprazole  40 mg Oral Q0600  . polyethylene glycol  17 g Oral BID  . potassium chloride  40 mEq Oral Once   Continuous Infusions: . 0.9 % NaCl with KCl 40 mEq / L 60 mL/hr (06/08/18 0532)   PRN Meds:.acetaminophen **OR** acetaminophen, HYDROcodone-acetaminophen, ipratropium-albuterol, ondansetron **OR** ondansetron (ZOFRAN) IV, oxyCODONE, sodium phosphate, traZODone Allergies  Allergen Reactions  . Codeine Nausea Only and Other (See Comments)    Nervous/sweating   Review of Systems  Constitutional: Positive for activity change, appetite change and fatigue.  HENT: Negative.   Gastrointestinal: Positive for abdominal pain.  Neurological: Positive for weakness.    Physical Exam Vitals signs and nursing note reviewed.  Constitutional:      Appearance: She is underweight. She is ill-appearing.  Cardiovascular:     Rate and Rhythm: Normal rate.  Pulmonary:     Effort: Pulmonary effort is normal. No tachypnea, accessory muscle usage or respiratory distress.  Abdominal:     General: Abdomen is flat.     Palpations: Abdomen is soft.     Tenderness: There is abdominal tenderness.  Neurological:     Mental Status: She is alert. She is disoriented and confused.     Vital Signs: BP (!) 145/60 (BP Location: Left Arm)   Pulse (!) 105   Temp 98.6 F (37 C) (Oral)   Resp 17   Ht '5\' 9"'$  (1.753 m)   Wt 47.4 kg   SpO2 95%   BMI  15.43 kg/m  Pain Scale: Faces   Pain Score: Asleep   SpO2: SpO2: 95 % O2 Device:SpO2: 95 % O2 Flow Rate: .O2 Flow Rate (L/min): 2 L/min  IO: Intake/output summary:   Intake/Output Summary (Last 24 hours) at 06/08/2018 0935 Last data filed at 06/08/2018 0900 Gross per 24 hour  Intake 1522 ml  Output -  Net 1522 ml    LBM: Last BM Date: 06/07/18 Baseline Weight: Weight: 48.9 kg Most recent weight: Weight: 47.4 kg     Palliative Assessment/Data: 40-50%     Time In/Out: 1000-1100, 1220-1245 Time Total: 85 min Greater than 50%  of this time was spent counseling and coordinating care related to the above assessment and plan.  Signed by: Vinie Sill, NP Palliative Medicine Team Pager # [phone] (M-F 8a-5p) Team Phone # [phone] (Nights/Weekends)

## 2018-06-08 NOTE — Progress Notes (Signed)
Patient's O2 sat in the low 70's on room air. O2 at 2L [place] placed which brought sat up to mid 80's. Increased O2 to 3 L [place] which brought O2 sat up to 92. Respiratory Therapist called for breathing treatment.

## 2018-06-08 NOTE — Progress Notes (Signed)
PROGRESS NOTE  Melinda Larsen  ACZ:[id number]  DOB: 03-Apr-1940  DOA: 06/06/2018 PCP: Redmond School, MD   Brief Admission Hx: 78 year old female with stage IV metastatic lung cancer presented with left-sided chest wall pain and abdominal pain and thought to have a postobstructive pneumonia.  Her chest x-ray findings were worrisome for progressive left lung tumor.  She was noted to have severe constipation requiring disimpaction and laxative therapy.  Patient initially had been refusing care in the hospital however she has eventually allowed her port to be accessed and had taken some medications.  Palliative medicine consult requested for goals of care given patient has not been clear about wanting treatment.  MDM/Assessment & Plan:   1. Generalized abdominal pain- secondary to chronic constipation.  She has been disimpacted and started on laxatives.  She had been given an enema with good results.  She is having bowel movements now.  Her abdominal pain seems to be improving. 2. Stage IV non-small cell lung cancer-chest x-ray is worrisome for progressive tumor and advancement of the cancer.  This likely is the cause of her left-sided chest pain symptoms.  She is being treated for postobstructive pneumonia.  She initially refused to take any oral medications however yesterday she did take some.  Palliative medicine consulted for goals of care.  I also asked for oncology consult for their recommendations regarding her prognosis and treatment options. 3. Hypoxia-patient has a new oxygen requirement likely secondary to worsening lung cancer and postobstructive pneumonia.  She is responding to supplemental oxygen via nasal cannula. 4. Leukocytosis- WBC elevation is likely multifactorial given postobstructive pneumonia and advancing tumor burden.  Monitoring CBC. 5. Hypokalemia-this has improved with supplementation.  The patient initially refused to allow treatment however this is improved and she took  her potassium supplement yesterday.  Magnesium is being replaced. 6. Sinus tachycardia-likely secondary to metastatic cancer.  DVT prophylaxis: Lovenox Code Status: DNR Family Communication: Sister Bertram Millard updated telephone Disposition Plan: Pending palliative discussions for goals of care   Consultants:  Palliative medicine  Oncology  Procedures:  N/A  Antimicrobials:  Augmentin 06/06/2018 >>  Subjective: Patient is without any specific complaints today.  She says that her left side pain is not bothering her this morning.  She has been having bowel movements.  The abdominal pain is much better.  Objective: Vitals:   06/08/18 0556 06/08/18 0618 06/08/18 0741 06/08/18 0743  BP: (!) 145/60     Pulse: (!) 105     Resp: 17     Temp: 98.6 F (37 C)     TempSrc: Oral     SpO2: (!) 88% 94% 95% 95%  Weight:      Height:        Intake/Output Summary (Last 24 hours) at 06/08/2018 0951 Last data filed at 06/08/2018 0900 Gross per 24 hour  Intake 1522 ml  Output -  Net 1522 ml   Filed Weights   06/06/18 0849 06/06/18 1315  Weight: 48.9 kg 47.4 kg     REVIEW OF SYSTEMS  As per history otherwise all reviewed and reported negative  Exam:  General exam: Emaciated, chronically ill-appearing female, lying in the bed.  She seems very tired and frustrated. Respiratory system: Coarse breath sounds in the left upper lobe and left middle lobe. No increased work of breathing. Cardiovascular system: S1 & S2 heard.  Mild tachycardia.  No JVD, murmurs, gallops, clicks or pedal edema. Gastrointestinal system: Abdomen is nondistended, soft and nontender. Normal bowel sounds heard. Central  nervous system: Alert and oriented. No focal neurological deficits. Extremities: no CCE.  Data Reviewed: Basic Metabolic Panel: Recent Labs  Lab 06/06/18 0932 06/07/18 0557 06/08/18 0852  NA 139 141 140  K 2.8* 2.3* 3.8  CL 101 103 105  CO2 27 25 27   GLUCOSE 150* 87 101*  BUN 11 12 14    CREATININE 0.41* 0.42* 0.42*  CALCIUM 7.6* 7.2* 7.3*  MG 1.3* 1.1*  --   PHOS  --   --  2.1*   Liver Function Tests: Recent Labs  Lab 06/06/18 0932 06/07/18 0557 06/08/18 0852  AST 18 18  --   ALT 9 7  --   ALKPHOS 64 55  --   BILITOT 0.8 0.6  --   PROT 6.6 5.9*  --   ALBUMIN 2.1* 1.9* 1.7*   Recent Labs  Lab 06/06/18 0932  LIPASE 25   No results for input(s): AMMONIA in the last 168 hours. CBC: Recent Labs  Lab 06/06/18 0932 06/07/18 0557 06/08/18 0852  WBC 16.1* 14.7* 15.8*  NEUTROABS  --  13.0*  --   HGB 9.3* 8.3* 8.4*  HCT 33.3* 30.2* 30.2*  MCV 87.4 87.8 87.3  PLT 262 235 217   Cardiac Enzymes: Recent Labs  Lab 06/06/18 0932  TROPONINI 0.03*   CBG (last 3)  No results for input(s): GLUCAP in the last 72 hours. Recent Results (from the past 240 hour(s))  SARS Coronavirus 2 (CEPHEID- Performed in [place] hospital lab), Hosp Order     Status: None   Collection Time: 06/06/18 11:04 AM  Result Value Ref Range Status   SARS Coronavirus 2 NEGATIVE NEGATIVE Final    Comment: (NOTE) If result is NEGATIVE SARS-CoV-2 target nucleic acids are NOT DETECTED. The SARS-CoV-2 RNA is generally detectable in upper and lower  respiratory specimens during the acute phase of infection. The lowest  concentration of SARS-CoV-2 viral copies this assay can detect is 250  copies / mL. A negative result does not preclude SARS-CoV-2 infection  and should not be used as the sole basis for treatment or other  patient management decisions.  A negative result may occur with  improper specimen collection / handling, submission of specimen other  than nasopharyngeal swab, presence of viral mutation(s) within the  areas targeted by this assay, and inadequate number of viral copies  (<250 copies / mL). A negative result must be combined with clinical  observations, patient history, and epidemiological information. If result is POSITIVE SARS-CoV-2 target nucleic acids are  DETECTED. The SARS-CoV-2 RNA is generally detectable in upper and lower  respiratory specimens dur ing the acute phase of infection.  Positive  results are indicative of active infection with SARS-CoV-2.  Clinical  correlation with patient history and other diagnostic information is  necessary to determine patient infection status.  Positive results do  not rule out bacterial infection or co-infection with other viruses. If result is PRESUMPTIVE POSTIVE SARS-CoV-2 nucleic acids MAY BE PRESENT.   A presumptive positive result was obtained on the submitted specimen  and confirmed on repeat testing.  While 2019 novel coronavirus  (SARS-CoV-2) nucleic acids may be present in the submitted sample  additional confirmatory testing may be necessary for epidemiological  and / or clinical management purposes  to differentiate between  SARS-CoV-2 and other Sarbecovirus currently known to infect humans.  If clinically indicated additional testing with an alternate test  methodology [phone]) is advised. The SARS-CoV-2 RNA is generally  detectable in upper and lower respiratory  sp ecimens during the acute  phase of infection. The expected result is Negative. Fact Sheet for Patients:  StrictlyIdeas.no Fact Sheet for Healthcare Providers: BankingDealers.co.za This test is not yet approved or cleared by the [place] FDA and has been authorized for detection and/or diagnosis of SARS-CoV-2 by FDA under an Emergency Use Authorization (EUA).  This EUA will remain in effect (meaning this test can be used) for the duration of the COVID-19 declaration under Section 564(b)(1) of the Act, 21 U.S.C. section 360bbb-3(b)(1), unless the authorization is terminated or revoked sooner. Performed at [facility], [address], [place], [place] [address]      Studies: Dg Abd Acute 2+v W 1v Chest  Result Date: 06/06/2018 CLINICAL DATA:  Abdominal pain.  Chest  pain.  Lung cancer. EXAM: DG ABDOMEN ACUTE W/ 1V CHEST COMPARISON:  05/01/2018 chest radiograph. FINDINGS: Right internal jugular Port-A-Cath terminates at the cavoatrial junction. Stable cardiomediastinal silhouette with mild cardiomegaly. No pneumothorax. No right pleural effusion. Possible small left pleural effusion. Worsening patchy consolidation throughout the left lung with associated volume loss. Clear right lung. No disproportionately dilated small bowel loops. Prominent stool and gas throughout the colon. No evidence of pneumatosis or pneumoperitoneum. No radiopaque nephrolithiasis. IMPRESSION: 1. Worsening patchy opacity throughout the left lung with associated volume loss, which could be due to any combination of postobstructive pneumonia, atelectasis and/or tumor. 2. Possible small left pleural effusion. 3. Nonobstructive bowel gas pattern. 4. Prominent stool and gas throughout the colon, which may indicate constipation. Electronically Signed   By: Ilona Sorrel M.D.   On: 06/06/2018 10:20   Scheduled Meds: . amLODipine  5 mg Oral QPM  . aspirin EC  81 mg Oral QPM  . dronabinol  5 mg Oral BID AC  . enoxaparin (LOVENOX) injection  40 mg Subcutaneous Q24H  . feeding supplement (ENSURE ENLIVE)  237 mL Oral TID BM  . mirtazapine  15 mg Oral QHS  . pantoprazole  40 mg Oral Q0600  . polyethylene glycol  17 g Oral BID  . potassium chloride  40 mEq Oral Once   Continuous Infusions: . 0.9 % NaCl with KCl 40 mEq / L 60 mL/hr (06/08/18 0532)    Principal Problem:   Constipation Active Problems:   Lung cancer ([place])   Brain metastasis ([facility])   Metastasis to bone ([facility])   Tobacco abuse   Adenocarcinoma ([facility]) metastatic of lung   Non-small cell lung cancer (NSCLC) ([facility])   Postobstructive pneumonia   Abdominal pain   Chest wall pain  Time spent:   Irwin Brakeman, MD [facility] 06/08/2018, 9:51 AM    LOS: 2 days  How to contact the [facility] Attending or Consulting provider [place] or  covering provider during after hours [place], for this patient?  1. Check the care team in [facility] and look for a) attending/consulting [facility] provider listed and b) the [facility] team listed 2. Log into [email or web address] and use [place]'s universal password to access. If you do not have the password, please contact the hospital operator. 3. Locate the [facility] provider you are looking for under [facility] and page to a number that you can be directly reached. 4. If you still have difficulty reaching the provider, please page the [facility] (Director on Call) for the Hospitalists listed on amion for assistance.

## 2018-06-08 NOTE — Progress Notes (Signed)
Initial Nutrition Assessment  DOCUMENTATION CODES:   Severe malnutrition in context of chronic illness, Underweight  INTERVENTION:  Ensure Enlive po BID, each supplement provides 350 kcal and 20 grams of protein   Constipation Nutrition Therapy education provided   NUTRITION DIAGNOSIS:   Severe Malnutrition related to chronic illness, cancer and cancer related treatments as evidenced by severe muscle depletion, severe fat depletion.   GOAL:   Patient will meet greater than or equal to 90% of their needs(-if feasible with her suspected metastic disease) .   MONITOR:   PO intake, Supplement acceptance, Weight trends, Labs REASON FOR ASSESSMENT:   Malnutrition Screening Tool    ASSESSMENT: Patient is a 78 yo female who presents complaining of abdominal pain/ constipation (no BM x 2 weeks). She has a hx of Stage IV lung cancer (bone and brain metz) and is having problem with hypoxia and pneumonia. Port a cath for chemotherapy.   Home diet is regular.  Patient seen by RD as outpatient at [facility] on 03/31/18. RD reviewed high calorie/ High Protein foods with her during visit and provided handout , Increasing Calories and Protein. She is coughing throughout RD visit. Lunch tray has arrived but pt not hungry. Only 50% of breakfast consumed this morning.   She weighed 62.4 kg at time of visit. Current weight (47.4 kg) reflects a severe loss of 24% in the past 9 weeks. Patient has severe loss of muscle and fat mass overall. Fatigue with ambulation per PT. Palliative has been consulted.  Medications reviewed and include: Ensure BID, Remeron, Protonix, Miralax, Potassium-Chloride   Labs: BMP / CBC-WBC-15.8 (H), Hgb 8.4 (L)  BMP Latest Ref Rng & Units 06/08/2018 06/07/2018 06/06/2018  Glucose 70 - 99 mg/dL 101(H) 87 150(H)  BUN 8 - 23 mg/dL 14 12 11   Creatinine 0.44 - 1.00 mg/dL 0.42(L) 0.42(L) 0.41(L)  Sodium 135 - 145 mmol/L 140 141 139  Potassium 3.5 - 5.1 mmol/L 3.8 2.3(LL) 2.8(L)  Chloride  98 - 111 mmol/L 105 103 101  CO2 22 - 32 mmol/L 27 25 27   Calcium 8.9 - 10.3 mg/dL 7.3(L) 7.2(L) 7.6(L)     NUTRITION - FOCUSED PHYSICAL EXAM:    Most Recent Value  Orbital Region  Moderate depletion  Upper Arm Region  Severe depletion  Thoracic and Lumbar Region  Severe depletion  Buccal Region  Moderate depletion  Temple Region  Moderate depletion  Clavicle Bone Region  Severe depletion  Clavicle and Acromion Bone Region  Severe depletion  Scapular Bone Region  Severe depletion  Dorsal Hand  Mild depletion  Patellar Region  Severe depletion  Anterior Thigh Region  Severe depletion  Posterior Calf Region  Severe depletion  Edema (RD Assessment)  None  Hair  Reviewed  Eyes  Reviewed  Mouth  Reviewed  Skin  Reviewed       Diet Order:   Diet Order            Diet regular Room service appropriate? Yes; Fluid consistency: Thin  Diet effective now              EDUCATION NEEDS:   Education needs have been addressed(Constipation Nutrition Therapy handout) Skin:  Skin Assessment: Reviewed RN Assessment  Last BM:  5/3 small type 6 stool- disimpacted on 5/2  Height:   Ht Readings from Last 1 Encounters:  06/06/18 5\' 9"  (1.753 m)    Weight:   Wt Readings from Last 1 Encounters:  06/06/18 47.4 kg    Ideal Body Weight:  66 kg  BMI:  Body mass index is 15.43 kg/m.  Estimated Nutritional Needs:   Kcal:  1962-2297 (33-36 kcal/kg/bw)  Protein:  75-85 gr (1.6-1.8 gr/kg/bw)  Fluid:  >1400 ml daily   Colman Cater MS,RD,CSG,LDN Office: [phone] Pager: [phone]

## 2018-06-08 NOTE — Care Management Important Message (Signed)
Important Message  Patient Details  Name: Melinda Larsen MRN: [id number] Date of Birth: Oct 05, 1940   Medicare Important Message Given:  Yes    Tommy Medal 06/08/2018, 1:30 PM

## 2018-06-09 LAB — CBC
HCT: 34.5 % — ABNORMAL LOW (ref 36.0–46.0)
Hemoglobin: 9.6 g/dL — ABNORMAL LOW (ref 12.0–15.0)
MCH: 24.1 pg — ABNORMAL LOW (ref 26.0–34.0)
MCHC: 27.8 g/dL — ABNORMAL LOW (ref 30.0–36.0)
MCV: 86.7 fL (ref 80.0–100.0)
Platelets: 268 10*3/uL (ref 150–400)
RBC: 3.98 MIL/uL (ref 3.87–5.11)
RDW: 18.7 % — ABNORMAL HIGH (ref 11.5–15.5)
WBC: 19.8 10*3/uL — ABNORMAL HIGH (ref 4.0–10.5)
nRBC: 0 % (ref 0.0–0.2)

## 2018-06-09 LAB — RENAL FUNCTION PANEL
Albumin: 1.9 g/dL — ABNORMAL LOW (ref 3.5–5.0)
Anion gap: 9 (ref 5–15)
BUN: 15 mg/dL (ref 8–23)
CO2: 29 mmol/L (ref 22–32)
Calcium: 8 mg/dL — ABNORMAL LOW (ref 8.9–10.3)
Chloride: 105 mmol/L (ref 98–111)
Creatinine, Ser: 0.46 mg/dL (ref 0.44–1.00)
GFR calc Af Amer: >60 mL/min (ref >60)
GFR calc non Af Amer: >60 mL/min (ref >60)
Glucose, Bld: 117 mg/dL — ABNORMAL HIGH (ref 70–99)
Phosphorus: 2 mg/dL — ABNORMAL LOW (ref 2.5–4.6)
Potassium: 4.4 mmol/L (ref 3.5–5.1)
Sodium: 143 mmol/L (ref 135–145)

## 2018-06-09 LAB — MAGNESIUM: Magnesium: 1.5 mg/dL — ABNORMAL LOW (ref 1.7–2.4)

## 2018-06-09 MED ORDER — MAGNESIUM SULFATE 2 GM/50ML IV SOLN
2.0000 g | Freq: Once | INTRAVENOUS | Status: AC
  Administered 2018-06-09: 2 g via INTRAVENOUS
  Filled 2018-06-09: qty 50

## 2018-06-09 MED ORDER — MORPHINE SULFATE (CONCENTRATE) 10 MG/0.5ML PO SOLN: 2.5000 mg | ORAL | Status: DC | PRN

## 2018-06-09 MED ORDER — MORPHINE SULFATE (PF) 2 MG/ML IV SOLN
1.0000 mg | INTRAVENOUS | Status: DC | PRN
  Administered 2018-06-09 – 2018-06-11 (×6): 2 mg via INTRAVENOUS
  Filled 2018-06-09 (×6): qty 1

## 2018-06-09 MED ORDER — AMOXICILLIN-POT CLAVULANATE 875-125 MG PO TABS
1.0000 | ORAL_TABLET | Freq: Two times a day (BID) | ORAL | Status: DC
  Administered 2018-06-10: 1 via ORAL
  Filled 2018-06-09 (×2): qty 1

## 2018-06-09 MED ORDER — FUROSEMIDE 10 MG/ML IJ SOLN
40.0000 mg | Freq: Once | INTRAMUSCULAR | Status: AC
  Administered 2018-06-09: 40 mg via INTRAVENOUS
  Filled 2018-06-09: qty 4

## 2018-06-09 NOTE — Plan of Care (Signed)
  Problem: Nutrition: Goal: Adequate nutrition will be maintained Outcome: Not Progressing   Problem: Pain Managment: Goal: General experience of comfort will improve Outcome: Not Progressing   Problem: Safety: Goal: Abi Problem: Skin Integrity: Goal: Risk for impaired skin integrity will decrease Outcome: Not Progressing  lity to remain free from injury will improve Outcome: Not Progressing

## 2018-06-09 NOTE — Progress Notes (Addendum)
Palliative:  I spoke extensively with RN regarding Ms. Manville's status. She is confused, pulling off oxygen, pulling out multiple IVs, restless/agitated, moaning at times and refused to eat this morning but did have a few sips of liquids. RN states that Ms. Pluta attempted to feed self yesterday but would not attempt today and refused attempts by staff. She has clearly continued to decline since yesterday with increasing WBC and oxygen requirements.   I called to speak with her son, [name]. I relayed the above information to him. We discussed her confusion and the fact that she told me it was 1988 yesterday. I explained to him that I do not feel she is likely to improve and that even with hospice care I feel she will be very difficult for him to care for given her significant decline. [name] is very tearful. He struggles to continue the conversation. He does agree that her comfort is priority at this stage. I encouraged him to consider hospice facility placement for EOL care s/t lung cancer. [name] was appreciative of conversation but needs time to process his mother's decline. He has my contact information and I will follow up with him again tomorrow if I do not hear from him today.   Plan: - Ms. Wank remains confused and unable to participate in [place]. I will continue conversation with son, [name].  - Comfort is priority of care. Son considering hospice facility placement.  - PRN Morphine added to ensure comfort from pain and SOB.   19 min  Vinie Sill, NP Palliative Medicine Team Pager # [phone] (M-F 8a-5p) Team Phone # [phone] (Nights/Weekends)  The above conversation was completed via telephone due to the visitor restrictions during the COVID-19 pandemic. Thorough chart review and discussion with necessary members of the care team was completed as part of assessment. All issues were discussed and addressed but no physical exam was performed.

## 2018-06-09 NOTE — Progress Notes (Signed)
Oxygen was 79% on 2L.  Placed pt on 6L.  I was able to wean down to 5L with oxygen saturation at 92%.  Pt constantly pulls nasal cannula off.  Requires frequent redirection and reorientation.

## 2018-06-09 NOTE — Progress Notes (Signed)
Initially pt would wear her oxygen.  At this time, she is refusing to wear it.  Patient was on 4.5L.  She does not appear to be in distress.  Her confusion has fluctuated throughout the shift.  Will continue to monitor.
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
## 2018-06-09 NOTE — Progress Notes (Signed)
Physical Therapy Treatment Patient Details Name: Melinda Larsen MRN: [id number] DOB: 11-14-1940 Today's Date: 06/09/2018    History of Present Illness Melinda Larsen is a 78 y.o. female who is currently being treated for stage IV metastatic lung cancer who was recently hospitalized approximately 1 month ago with a postobstructive pneumonia and was subsequently discharged to [facility] skilled nursing facility has been discharged from the facility and has been home.  She presented to the emergency department today complaining of left-sided chest wall pain and abdominal pain.  She reports that she has not had a formed bowel movement in more than 2 weeks.  She is also had progressive weakness and shortness of breath.  She has had nausea vomiting and diarrhea.  The patient reports that she has been having flank pain.  She has had poor appetite and general malaise.  X-rays of the abdomen reveal constipation.  The chest x-ray shows worsening left lung findings.  She was noted to be hypoxic and her O2 sats remained in the 80s without oxygen at rest.  She was tested for COVID-19 which tested negative.  She was found to have some electrolyte abnormalities with hypokalemia and hypocalcemia.  She is being admitted for further management.    PT Comments    Pt stated need for bowel movement.  Bed mobility, transfer training and short duration gait training complete to [facility] with use of RW.  While sitting on commade pt removed nasal cannula with O2 A and became agitated with therapist attempt to replace.  Pt educated need for oxygen assistance for safety.  Mod A required with sit to stand due to LE weakness, used RW for safety with no LOB episodes.  Decreased distance with gait training this session due to pt.'s fatigue and safety.  EOS pt returned to bed upon request due to fatigue.  NT arrived to assist with replacing O2 A nasal cannula and pure wick.  Pt with call bell within reach and bed alarm set.    Follow Up  Recommendations  SNF;Supervision for mobility/OOB;Supervision/Assistance - 24 hour     Equipment Recommendations       Recommendations for Other Services       Precautions / Restrictions Precautions Precautions: Fall Restrictions Weight Bearing Restrictions: No    Mobility  Bed Mobility Overal bed mobility: Needs Assistance Bed Mobility: Supine to Sit     Supine to sit: Min assist     General bed mobility comments: labored movement, increased time  Transfers Overall transfer level: Needs assistance   Transfers: Sit to/from Stand Sit to Stand: Mod assist         General transfer comment: slow labored movement, verbal/tactile cueing for proper hand placement during sit to stands with fair/good carryover  Ambulation/Gait Ambulation/Gait assistance: Min assist Gait Distance (Feet): 5 Feet Assistive device: Rolling walker (2 wheeled) Gait Pattern/deviations: Decreased step length - right;Decreased step length - left;Decreased stride length Gait velocity: decreased   General Gait Details: labored slow movements without LOB.  Reduced distance due to weakness and safety.  Limited by fatigue and aggitated wiht request to replace O2A   Stairs             Wheelchair Mobility    Modified Rankin (Stroke Patients Only)       Balance  Cognition Arousal/Alertness: Awake/alert Behavior During Therapy: WFL for tasks assessed/performed Overall Cognitive Status: Within Functional Limits for tasks assessed                                 General Comments: able to state name, DOB and aware of location.  Pt stated she needs use of BSC for bowel movement.  While sitting on BSC pt removed nasal cannula and required constant cueing to replace.  Pt became agitated with request, NT requested to assist.        Exercises      General Comments        Pertinent Vitals/Pain Pain Assessment:  No/denies pain    Home Living                      Prior Function            PT Goals (current goals can now be found in the care plan section)      Frequency    Min 3X/week      PT Plan Current plan remains appropriate    Co-evaluation              AM-PAC PT "6 Clicks" Mobility   Outcome Measure  Help needed turning from your back to your side while in a flat bed without using bedrails?: None Help needed moving from lying on your back to sitting on the side of a flat bed without using bedrails?: A Little Help needed moving to and from a bed to a chair (including a wheelchair)?: A Little Help needed standing up from a chair using your arms (e.g., wheelchair or bedside chair)?: A Little Help needed to walk in hospital room?: A Little Help needed climbing 3-5 steps with a railing? : A Lot 6 Click Score: 18    End of Session Equipment Utilized During Treatment: Gait belt;Oxygen Activity Tolerance: Patient tolerated treatment well;Patient limited by fatigue Patient left: in bed;with call bell/phone within reach;with bed alarm set;with nursing/sitter in room(NT in room at EOS) Nurse Communication: Mobility status PT Visit Diagnosis: Unsteadiness on feet (R26.81);Other abnormalities of gait and mobility (R26.89);Muscle weakness (generalized) (M62.81)     Time: 1610-9604 PT Time Calculation (min) (ACUTE ONLY): 23 min  Charges:  $Therapeutic Activity: 23-37 mins                     Ihor Austin, [place]; CBIS [phone]'  Melinda Larsen 06/09/2018, 9:56 AM

## 2018-06-10 ENCOUNTER — Telehealth: Payer: Self-pay | Admitting: Radiation Oncology

## 2018-06-10 DIAGNOSIS — E876 Hypokalemia: Secondary | ICD-10-CM

## 2018-06-10 DIAGNOSIS — J9601 Acute respiratory failure with hypoxia: Secondary | ICD-10-CM

## 2018-06-10 DIAGNOSIS — Z7189 Other specified counseling: Secondary | ICD-10-CM

## 2018-06-10 DIAGNOSIS — E43 Unspecified severe protein-calorie malnutrition: Secondary | ICD-10-CM

## 2018-06-10 DIAGNOSIS — K5909 Other constipation: Secondary | ICD-10-CM

## 2018-06-10 LAB — PROCALCITONIN: Procalcitonin: 0.3 ng/mL

## 2018-06-10 MED ORDER — ENOXAPARIN SODIUM 40 MG/0.4ML ~~LOC~~ SOLN: 40.0000 mg | SUBCUTANEOUS | Status: DC

## 2018-06-10 MED ORDER — MAGNESIUM SULFATE 2 GM/50ML IV SOLN
2.0000 g | Freq: Once | INTRAVENOUS | Status: AC
  Administered 2018-06-10: 2 g via INTRAVENOUS
  Filled 2018-06-10: qty 50

## 2018-06-10 MED ORDER — SODIUM CHLORIDE 0.9 % IV SOLN
INTRAVENOUS | Status: DC
  Administered 2018-06-10: 17:00:00 via INTRAVENOUS

## 2018-06-10 MED ORDER — LORAZEPAM 2 MG/ML IJ SOLN
1.0000 mg | INTRAMUSCULAR | Status: DC | PRN
  Administered 2018-06-10: 1 mg via INTRAVENOUS
  Filled 2018-06-10: qty 1

## 2018-06-10 MED ORDER — SENNA 8.6 MG PO TABS: 2.0000 | ORAL_TABLET | Freq: Every day | ORAL | Status: DC

## 2018-06-10 MED ORDER — PIPERACILLIN-TAZOBACTAM 3.375 G IVPB
3.3750 g | Freq: Three times a day (TID) | INTRAVENOUS | Status: DC
  Administered 2018-06-10 – 2018-06-11 (×3): 3.375 g via INTRAVENOUS
  Filled 2018-06-10 (×3): qty 50

## 2018-06-10 NOTE — Progress Notes (Signed)
I received a call back from patient's son. Had long discussion about advance care planning and goals of care -Advance care planning, including the explanation and discussion of advance directives was carried out with the patient and family.  Code status including explanations of "Full Code" and "DNR" and alternatives were discussed in detail.  Discussion of end-of-life issues including but not limited palliative care, hospice care and the concept of hospice, other end-of-life care options, power of attorney for health care decisions, living wills, and physician orders for life-sustaining treatment were also discussed with the patient and family.  Total face to face time 16 minutes.  Had earlier conversation with palliative medicine to update me on [place]. I discussed the patient's overall poor prognosis in the setting of her comorbidities, recurrent hospitalizations and continued progression of her lung cancer. I discussed that her condition continues to deteriorate despite continued therapy. Son subsequently expressed he wanted his mother to be comfortable without any further suffering.  He agreed that transitioning her care to full comfort to maintain her dignity and to allow for a natural death was the best. I have stopped any further diagnostic testing per son's request.  Social work consulted to transition to residential hospice.  Total time 35 minutes in addition to that spent earlier  [employer], DO

## 2018-06-10 NOTE — Progress Notes (Signed)
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  Notably, the patient was recently admitted to the hospital from 05/04/2020 05/07/2018 when she was treated for postobstructive pneumonia.  She was discharged home with Augmentin.  In addition, she also had a recent hospitalization from 05/01/2018 through 05/03/2018 during which she was also treated for pneumonia. At the time of admission, the patient had not had a bowel movement in approximately 2 weeks.  She complains of increasing generalized weakness and shortness of breath.  There was also some nausea and vomiting and poor oral intake.  She was noted to have oxygen saturation in the 80% range on room air.  COVID testing was negative.  WBC was 16.1 at the time of admission.  The patient was initially started on Augmentin.  Palliative medicine was consulted to assist with management.  During the hospitalization, the patient has been intermittently refusing all care.  In addition, the patient has been confused with decreased oral intake.  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  There is a large left hilar mass with possible postobstructive pneumonia. -Last chemotherapy 04/21/2018 -Patient follows Dr. Delton Coombes   Leukocytosis -Secondary to worsening tumor burden and possibly underlying pneumonia -Discontinue Augmentin -Start Zosyn -blood cultures x 2 set  Severe protein calorie malnutrition -Continue supplements  Sinus tachycardia -CTA chest r/o PE  Hypokalemia/hypomagnesemia -Repleted -am BMP and mag  Essential Hypertension -holding amlodipine -monitor off meds  Goals of Care -DNR -appreciate palliative follow up -I tried to call son multiple times -06/10/18--I spoke with sister who stated she spoke with son today  Stage 2 sacral pressure ulcer -not infected -local care     Disposition Plan:   Residential hospice vs SNF with hospice  Family Communication:   Sister updated on phone 5/6  Consultants: palliative medicine   Code Status:  DNR  DVT Prophylaxis:  [place] Lovenox   Procedures: As Listed in Progress Note Above  Antibiotics: Zosyn 5/6>>> Amox/clav 5/2>>>5/5   Total time spent 35 minutes.  Greater than 50% spent face to face counseling and coordinating care.    Subjective: Pt is pleasantly confused,  She is intermittently agitated.  No n/v/d cp, sob.  Remainder of ROS unobtainable due to mental status.  Poor po intake  Objective: Vitals:   06/09/18 2100 06/10/18 0511 06/10/18 1357 06/10/18 1401  BP: (!) 153/75 (!) 141/77 99/63 99/63   Pulse: (!) 104 (!) 115 (!) 111 (!) 111  Resp: 20 (!) 32 (!) 21 (!) 21  Temp: 98.4 F (36.9 C) 98.6 F (37 C) 98.3 F (36.8 C) 98.3 F (36.8 C)  TempSrc: Oral Oral Oral   SpO2: 100% 93% 100% 99%  Weight:  Height:        Intake/Output Summary (Last 24 hours) at 06/10/2018 1522 Last data filed at 06/10/2018 1212 Gross per 24 hour  Intake 1200 ml  Output -  Net 1200 ml   Weight change:  Exam:   General:  Pt is alert, follows commands  appropriately, not in acute distress  HEENT: No icterus, No thrush, No neck mass, [place]/AT  Cardiovascular: RRR, S1/S2, no rubs, no gallops  Respiratory: bilateral scattered rales. No wheeze  Abdomen: Soft/+BS, mild diffusely tender, non distended, no guarding  Extremities: No edema, No lymphangitis, No petechiae, No rashes, no synovitis   Data Reviewed: I have personally reviewed following labs and imaging studies Basic Metabolic Panel: Recent Labs  Lab 06/06/18 0932 06/07/18 0557 06/08/18 0852 06/09/18 0504  NA 139 141 140 143  K 2.8* 2.3* 3.8 4.4  CL 101 103 105 105  CO2 27 25 27 29   GLUCOSE 150* 87 101* 117*  BUN 11 12 14 15   CREATININE 0.41* 0.42* 0.42* 0.46  CALCIUM 7.6* 7.2* 7.3* 8.0*  MG 1.3* 1.1* 1.7 1.5*  PHOS  --   --  2.1* 2.0*   Liver Function Tests: Recent Labs  Lab 06/06/18 0932 06/07/18 0557 06/08/18 0852 06/09/18 0504  AST 18 18  --   --   ALT 9 7  --   --   ALKPHOS 64 55  --   --   BILITOT 0.8 0.6  --   --   PROT 6.6 5.9*  --   --   ALBUMIN 2.1* 1.9* 1.7* 1.9*   Recent Labs  Lab 06/06/18 0932  LIPASE 25   No results for input(s): AMMONIA in the last 168 hours. Coagulation Profile: No results for input(s): INR, PROTIME in the last 168 hours. CBC: Recent Labs  Lab 06/06/18 0932 06/07/18 0557 06/08/18 0852 06/09/18 0504  WBC 16.1* 14.7* 15.8* 19.8*  NEUTROABS  --  13.0*  --   --   HGB 9.3* 8.3* 8.4* 9.6*  HCT 33.3* 30.2* 30.2* 34.5*  MCV 87.4 87.8 87.3 86.7  PLT 262 235 217 268   Cardiac Enzymes: Recent Labs  Lab 06/06/18 0932  TROPONINI 0.03*   BNP: Invalid input(s): POCBNP CBG: No results for input(s): GLUCAP in the last 168 hours. HbA1C: No results for input(s): HGBA1C in the last 72 hours. Urine analysis:    Component Value Date/Time   COLORURINE AMBER (A) 05/05/2018 1320   APPEARANCEUR CLEAR 05/05/2018 1320   LABSPEC 1.023 05/05/2018 1320   PHURINE 7.0 05/05/2018 1320   GLUCOSEU NEGATIVE 05/05/2018 1320   HGBUR  NEGATIVE 05/05/2018 1320   BILIRUBINUR NEGATIVE 05/05/2018 1320   KETONESUR 5 (A) 05/05/2018 1320   PROTEINUR NEGATIVE 05/05/2018 1320   NITRITE NEGATIVE 05/05/2018 1320   LEUKOCYTESUR NEGATIVE 05/05/2018 1320   Sepsis Labs: @LABRCNTIP (procalcitonin:4,lacticidven:4) ) Recent Results (from the past 240 hour(s))  SARS Coronavirus 2 (CEPHEID- Performed in [place] hospital lab), Hosp Order     Status: None   Collection Time: 06/06/18 11:04 AM  Result Value Ref Range Status   SARS Coronavirus 2 NEGATIVE NEGATIVE Final    Comment: (NOTE) If result is NEGATIVE SARS-CoV-2 target nucleic acids are NOT DETECTED. The SARS-CoV-2 RNA is generally detectable in upper and lower  respiratory specimens during the acute phase of infection. The lowest  concentration of SARS-CoV-2 viral copies this assay can detect is 250  copies / mL. A negative result does not preclude SARS-CoV-2 infection  and should not be used as  the sole basis for treatment or other  patient management decisions.  A negative result may occur with  improper specimen collection / handling, submission of specimen other  than nasopharyngeal swab, presence of viral mutation(s) within the  areas targeted by this assay, and inadequate number of viral copies  (<250 copies / mL). A negative result must be combined with clinical  observations, patient history, and epidemiological information. If result is POSITIVE SARS-CoV-2 target nucleic acids are DETECTED. The SARS-CoV-2 RNA is generally detectable in upper and lower  respiratory specimens dur ing the acute phase of infection.  Positive  results are indicative of active infection with SARS-CoV-2.  Clinical  correlation with patient history and other diagnostic information is  necessary to determine patient infection status.  Positive results do  not rule out bacterial infection or co-infection with other viruses. If result is PRESUMPTIVE POSTIVE SARS-CoV-2 nucleic acids MAY BE  PRESENT.   A presumptive positive result was obtained on the submitted specimen  and confirmed on repeat testing.  While 2019 novel coronavirus  (SARS-CoV-2) nucleic acids may be present in the submitted sample  additional confirmatory testing may be necessary for epidemiological  and / or clinical management purposes  to differentiate between  SARS-CoV-2 and other Sarbecovirus currently known to infect humans.  If clinically indicated additional testing with an alternate test  methodology [phone]) is advised. The SARS-CoV-2 RNA is generally  detectable in upper and lower respiratory sp ecimens during the acute  phase of infection. The expected result is Negative. Fact Sheet for Patients:  StrictlyIdeas.no Fact Sheet for Healthcare Providers: BankingDealers.co.za This test is not yet approved or cleared by the [place] FDA and has been authorized for detection and/or diagnosis of SARS-CoV-2 by FDA under an Emergency Use Authorization (EUA).  This EUA will remain in effect (meaning this test can be used) for the duration of the COVID-19 declaration under Section 564(b)(1) of the Act, 21 U.S.C. section 360bbb-3(b)(1), unless the authorization is terminated or revoked sooner. Performed at [facility], [address] [place], [place] [address]      Scheduled Meds: . amoxicillin-clavulanate  1 tablet Oral Q12H  . feeding supplement (ENSURE ENLIVE)  237 mL Oral TID BM  . mirtazapine  15 mg Oral QHS  . pantoprazole  40 mg Oral Q0600  . polyethylene glycol  17 g Oral BID  . potassium chloride  40 mEq Oral Once   Continuous Infusions: . 0.9 % NaCl with KCl 40 mEq / L 10 mL/hr (06/09/18 0336)    Procedures/Studies: Dg Abd Acute 2+v W 1v Chest  Result Date: 06/06/2018 CLINICAL DATA:  Abdominal pain.  Chest pain.  Lung cancer. EXAM: DG ABDOMEN ACUTE W/ 1V CHEST COMPARISON:  05/01/2018 chest radiograph. FINDINGS: Right internal jugular  Port-A-Cath terminates at the cavoatrial junction. Stable cardiomediastinal silhouette with mild cardiomegaly. No pneumothorax. No right pleural effusion. Possible small left pleural effusion. Worsening patchy consolidation throughout the left lung with associated volume loss. Clear right lung. No disproportionately dilated small bowel loops. Prominent stool and gas throughout the colon. No evidence of pneumatosis or pneumoperitoneum. No radiopaque nephrolithiasis. IMPRESSION: 1. Worsening patchy opacity throughout the left lung with associated volume loss, which could be due to any combination of postobstructive pneumonia, atelectasis and/or tumor. 2. Possible small left pleural effusion. 3. Nonobstructive bowel gas pattern. 4. Prominent stool and gas throughout the colon, which may indicate constipation. Electronically Signed   By: Ilona Sorrel M.D.   On: 06/06/2018 10:20  Orson Eva, DO  [facility] Pager [phone]  If 7PM-7AM, please contact night-coverage [email or web address] Password [email or web address] 06/10/2018, 3:22 PM   LOS: 4 days

## 2018-06-10 NOTE — Care Management Important Message (Signed)
Important Message  Patient Details  Name: Melinda Larsen MRN: [id number] Date of Birth: 08-05-1940   Medicare Important Message Given:  Yes    Tommy Medal 06/10/2018, 12:50 PM

## 2018-06-10 NOTE — Telephone Encounter (Signed)
New message:     LVM for patient to return call to set up webex appt for 05/18

## 2018-06-10 NOTE — Progress Notes (Signed)
Palliative:  I spoke again with RN who reports that Melinda Larsen is not really eating or drinking today. She could not even get her to take her antibiotic. She reports more lethargic but more comfortable with morphine prn.   I called and spoke again with son, Melinda Larsen. We discussed his mother's continued decline and goals of care. Melinda Larsen continues to be tearful hearing of his mother's decline. We discussed next steps and he agrees that his mother would be better served at hospice than he could provide at home. However, he desires to get off the phone to discuss with his aunt and says he would call me back in 5-10 min. I waited ~30 min and then called him back without any answer ~1 hr later.   I will follow up again tomorrow.   Plan: - Recommend transition to hospice facility.  - Comfort is priority.   20 min  Vinie Sill, NP Palliative Medicine Team Pager # [phone] (M-F 8a-5p) Team Phone # [phone] (Nights/Weekends)  The above conversation was completed via telephone due to the visitor restrictions during the COVID-19 pandemic. Thorough chart review and discussion with necessary members of the care team was completed as part of assessment. All issues were discussed and addressed but no physical exam was performed.

## 2018-06-11 DIAGNOSIS — J9601 Acute respiratory failure with hypoxia: Secondary | ICD-10-CM

## 2018-06-11 MED ORDER — GLYCOPYRROLATE 0.2 MG/ML IJ SOLN
0.2000 mg | Freq: Three times a day (TID) | INTRAMUSCULAR | Status: DC
  Administered 2018-06-11: 11:00:00 0.2 mg via INTRAVENOUS
  Filled 2018-06-11: qty 1

## 2018-06-11 MED ORDER — HEPARIN SOD (PORK) LOCK FLUSH 100 UNIT/ML IV SOLN
10.0000 [IU] | Freq: Once | INTRAVENOUS | Status: DC
  Filled 2018-06-11: qty 5

## 2018-06-11 MED ORDER — MORPHINE SULFATE (CONCENTRATE) 10 MG/0.5ML PO SOLN: 2.5000 mg | ORAL | 0 refills | Status: AC | PRN

## 2018-06-11 NOTE — Discharge Summary (Signed)
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  Notably, the patient was recently admitted to the hospital from 05/04/2020 05/07/2018 when she was treated for postobstructive pneumonia.  She was discharged home with Augmentin.  In addition, she also had a recent hospitalization from 05/01/2018 through 05/03/2018 during which she was also treated for pneumonia. At the time of admission, the patient had not had a bowel movement in approximately 2 weeks.  She complains of increasing generalized weakness and shortness of breath.  There was also some nausea and vomiting and poor oral intake.  She was noted to have oxygen saturation in the 80% range on room air.  COVID testing was negative.  WBC was 16.1 at the time of admission.  The patient was initially started on Augmentin.  Palliative medicine was consulted to assist with management.  During the hospitalization, the patient has been intermittently refusing all care.  In addition, the patient has been confused with decreased oral intake.  Patient also frequently removes her supplemental oxygen.   Multiple discussions were held with the patient's son and sister regarding the patient's condition and goals of care. I discussed the patient's overall poor prognosis in the setting of her comorbidities, recurrent hospitalizations and continued progression of her lung cancer. I discussed that her condition continues to deteriorate despite continued therapy.  Son subsequently expressed he wanted his mother to be comfortable without any further suffering.  He agreed that transitioning her care to full comfort to maintain her dignity and to allow for a natural death was the best. I have stopped any further diagnostic testing per son's request.  Social work consulted to transition to residential hospice.  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  There is a large left hilar mass with possible postobstructive pneumonia. -Last chemotherapy 04/21/2018 -Patient follows Dr. Delton Coombes -I discussed that her condition continues to deteriorate despite continued therapy. Son subsequently expressed he wanted his mother to be comfortable without any further suffering.  He agreed that transitioning her care to full comfort to maintain her dignity and to allow for a natural death was the best. I have stopped any further diagnostic testing per son's request.  Social work consulted to transition to residential hospice.   Leukocytosis -Secondary to worsening tumor burden and possibly underlying pneumonia -Discontinue Augmentin -Started Zosyn initially -blood cultures x 2 set -patient now full comfort  Severe protein calorie malnutrition -Continue supplements  Sinus tachycardia -CTA chest r/o  PE--cancelled as patient transitioned to full comfort  Hypokalemia/hypomagnesemia -Repleted -am BMP and mag  Essential Hypertension -holding amlodipine -monitor off meds  Goals of Care I discussed the patient's overall poor prognosis in the setting of her comorbidities, recurrent hospitalizations and continued progression of her lung cancer. I discussed that her condition continues to deteriorate despite continued therapy. Son subsequently expressed he wanted his mother to be comfortable without any further suffering.  He agreed that transitioning her care to full comfort to maintain her dignity and to allow for a natural death was the best. I have stopped any further diagnostic testing per son's request.  Social work consulted to transition to residential hospice.  Stage 2 sacral pressure ulcer -not infected -local care    Discharge Instructions   Allergies as of 06/11/2018      Reactions   Codeine Nausea Only, Other (See Comments)   Nervous/sweating      Medication List    STOP taking these medications   amLODipine 5 MG tablet Commonly known as:  NORVASC   aspirin EC 81 MG tablet   benzonatate 100 MG capsule Commonly known as:  Tessalon Perles   dexamethasone 4 MG tablet Commonly known as:  DECADRON   dronabinol 5 MG capsule Commonly known as:  MARINOL   feeding supplement (ENSURE ENLIVE) Liqd   HYDROcodone-acetaminophen 5-325 MG tablet Commonly known as:  NORCO/VICODIN   Ipratropium-Albuterol 20-100 MCG/ACT Aers respimat Commonly known as:  COMBIVENT   mirtazapine 15 MG tablet Commonly known as:  Remeron     TAKE these medications   morphine CONCENTRATE 10 MG/0.5ML Soln concentrated solution Place 0.13-0.25 mLs (2.6-5 mg total) under the tongue every 2 (two) hours as needed for severe pain or shortness of breath.       Allergies  Allergen Reactions  . Codeine Nausea Only and Other (See Comments)    Nervous/sweating    Consultations:   Palliative medicine   Procedures/Studies: Dg Abd Acute 2+v W 1v Chest  Result Date: 06/06/2018 CLINICAL DATA:  Abdominal pain.  Chest pain.  Lung cancer. EXAM: DG ABDOMEN ACUTE W/ 1V CHEST COMPARISON:  05/01/2018 chest radiograph. FINDINGS: Right internal jugular Port-A-Cath terminates at the cavoatrial junction. Stable cardiomediastinal silhouette with mild cardiomegaly. No pneumothorax. No right pleural effusion. Possible small left pleural effusion. Worsening patchy consolidation throughout the left lung with associated volume loss. Clear right lung. No disproportionately dilated small bowel loops. Prominent stool and gas throughout the colon. No evidence of pneumatosis or pneumoperitoneum. No radiopaque nephrolithiasis. IMPRESSION: 1. Worsening patchy opacity throughout the left lung with associated volume loss, which could be due to any combination of postobstructive pneumonia, atelectasis and/or tumor. 2. Possible small left pleural effusion. 3. Nonobstructive bowel gas pattern. 4. Prominent stool and gas throughout the colon, which may indicate constipation. Electronically Signed   By: Ilona Sorrel M.D.   On: 06/06/2018 10:20        Discharge Exam: Vitals:   06/10/18 2136 06/11/18 0552  BP: (!) 149/64 129/67  Pulse: (!) 105 (!) 111  Resp: 20 20  Temp: 98.4 F (36.9 C)   SpO2: (!) 74% 97%   Vitals:   06/10/18 1401 06/10/18 2027 06/10/18 2136 06/11/18 0552  BP: 99/63  (!) 149/64 129/67  Pulse: (!) 111  (!) 105 (!) 111  Resp: (!) 21  20 20   Temp: 98.3 F (36.8 C)  98.4 F (36.9 C)   TempSrc:   Oral   SpO2: 99% 99% (!) 74% 97%  Weight:      Height:        General: Pt is  alert, awake, not in acute distress Cardiovascular: RRR, S1/S2 +, no rubs, no gallops Respiratory: bilateral scattered rales. No wheeze Abdominal: Soft, NT, ND, bowel sounds + Extremities: no edema, no cyanosis   The results of significant diagnostics from this hospitalization (including imaging,  microbiology, ancillary and laboratory) are listed below for reference.    Significant Diagnostic Studies: Dg Abd Acute 2+v W 1v Chest  Result Date: 06/06/2018 CLINICAL DATA:  Abdominal pain.  Chest pain.  Lung cancer. EXAM: DG ABDOMEN ACUTE W/ 1V CHEST COMPARISON:  05/01/2018 chest radiograph. FINDINGS: Right internal jugular Port-A-Cath terminates at the cavoatrial junction. Stable cardiomediastinal silhouette with mild cardiomegaly. No pneumothorax. No right pleural effusion. Possible small left pleural effusion. Worsening patchy consolidation throughout the left lung with associated volume loss. Clear right lung. No disproportionately dilated small bowel loops. Prominent stool and gas throughout the colon. No evidence of pneumatosis or pneumoperitoneum. No radiopaque nephrolithiasis. IMPRESSION: 1. Worsening patchy opacity throughout the left lung with associated volume loss, which could be due to any combination of postobstructive pneumonia, atelectasis and/or tumor. 2. Possible small left pleural effusion. 3. Nonobstructive bowel gas pattern. 4. Prominent stool and gas throughout the colon, which may indicate constipation. Electronically Signed   By: Ilona Sorrel M.D.   On: 06/06/2018 10:20     Microbiology: Recent Results (from the past 240 hour(s))  SARS Coronavirus 2 (CEPHEID- Performed in [place] hospital lab), Hosp Order     Status: None   Collection Time: 06/06/18 11:04 AM  Result Value Ref Range Status   SARS Coronavirus 2 NEGATIVE NEGATIVE Final    Comment: (NOTE) If result is NEGATIVE SARS-CoV-2 target nucleic acids are NOT DETECTED. The SARS-CoV-2 RNA is generally detectable in upper and lower  respiratory specimens during the acute phase of infection. The lowest  concentration of SARS-CoV-2 viral copies this assay can detect is 250  copies / mL. A negative result does not preclude SARS-CoV-2 infection  and should not be used as the sole basis for treatment or other  patient  management decisions.  A negative result may occur with  improper specimen collection / handling, submission of specimen other  than nasopharyngeal swab, presence of viral mutation(s) within the  areas targeted by this assay, and inadequate number of viral copies  (<250 copies / mL). A negative result must be combined with clinical  observations, patient history, and epidemiological information. If result is POSITIVE SARS-CoV-2 target nucleic acids are DETECTED. The SARS-CoV-2 RNA is generally detectable in upper and lower  respiratory specimens dur ing the acute phase of infection.  Positive  results are indicative of active infection with SARS-CoV-2.  Clinical  correlation with patient history and other diagnostic information is  necessary to determine patient infection status.  Positive results do  not rule out bacterial infection or co-infection with other viruses. If result is PRESUMPTIVE POSTIVE SARS-CoV-2 nucleic acids MAY BE PRESENT.   A presumptive positive result was obtained on the submitted specimen  and confirmed on repeat testing.  While 2019 novel coronavirus  (SARS-CoV-2) nucleic acids may be present in the submitted sample  additional confirmatory testing may be necessary for epidemiological  and / or clinical management purposes  to differentiate between  SARS-CoV-2 and other Sarbecovirus currently known to infect humans.  If clinically indicated additional testing with an alternate test  methodology [phone]) is advised. The SARS-CoV-2 RNA is generally  detectable in upper and lower respiratory sp ecimens during the acute  phase of infection. The expected result  is Negative. Fact Sheet for Patients:  StrictlyIdeas.no Fact Sheet for Healthcare Providers: BankingDealers.co.za This test is not yet approved or cleared by the [place] FDA and has been authorized for detection and/or diagnosis of SARS-CoV-2 by FDA under  an Emergency Use Authorization (EUA).  This EUA will remain in effect (meaning this test can be used) for the duration of the COVID-19 declaration under Section 564(b)(1) of the Act, 21 U.S.C. section 360bbb-3(b)(1), unless the authorization is terminated or revoked sooner. Performed at [facility], [address], [place], [place] [address]   Culture, blood (Routine X 2) w Reflex to ID Panel     Status: None (Preliminary result)   Collection Time: 06/10/18  5:01 PM  Result Value Ref Range Status   Specimen Description BLOOD LEFT WRIST  Final   Special Requests   Final    BOTTLES DRAWN AEROBIC AND ANAEROBIC Blood Culture adequate volume   Culture   Final    NO GROWTH < 24 HOURS Performed at [facility], [address] [place],  [address]    Report Status PENDING  Incomplete     Labs: Basic Metabolic Panel: Recent Labs  Lab 06/06/18 0932 06/07/18 0557 06/08/18 0852 06/09/18 0504  NA 139 141 140 143  K 2.8* 2.3* 3.8 4.4  CL 101 103 105 105  CO2 27 25 27 29   GLUCOSE 150* 87 101* 117*  BUN 11 12 14 15   CREATININE 0.41* 0.42* 0.42* 0.46  CALCIUM 7.6* 7.2* 7.3* 8.0*  MG 1.3* 1.1* 1.7 1.5*  PHOS  --   --  2.1* 2.0*   Liver Function Tests: Recent Labs  Lab 06/06/18 0932 06/07/18 0557 06/08/18 0852 06/09/18 0504  AST 18 18  --   --   ALT 9 7  --   --   ALKPHOS 64 55  --   --   BILITOT 0.8 0.6  --   --   PROT 6.6 5.9*  --   --   ALBUMIN 2.1* 1.9* 1.7* 1.9*   Recent Labs  Lab 06/06/18 0932  LIPASE 25   No results for input(s): AMMONIA in the last 168 hours. CBC: Recent Labs  Lab 06/06/18 0932 06/07/18 0557 06/08/18 0852 06/09/18 0504  WBC 16.1* 14.7* 15.8* 19.8*  NEUTROABS  --  13.0*  --   --   HGB 9.3* 8.3* 8.4* 9.6*  HCT 33.3* 30.2* 30.2* 34.5*  MCV 87.4 87.8 87.3 86.7  PLT 262 235 217 268   Cardiac Enzymes: Recent Labs  Lab 06/06/18 0932  TROPONINI 0.03*   BNP: Invalid input(s): POCBNP CBG: No results for input(s): GLUCAP in the last  168 hours.  Time coordinating discharge:  36 minutes  Signed:  Orson Eva, DO [facility] Pager: [phone] 06/11/2018, 10:40 AM

## 2018-06-11 NOTE — Care Management (Signed)
Referred to Residential Hospice. [facility] reviewing patient's information. Anticipate transfer today.

## 2018-06-11 NOTE — Progress Notes (Signed)
Report called to Hospice at this time.  Instructed by the hospice nurse to deaccess patient's mediport prior to transport

## 2018-06-11 NOTE — TOC Transition Note (Signed)
Transition of Care [facility]) - CM/SW Discharge Note   Patient Details  Name: Melinda Larsen MRN: [id number] Date of Birth: 10-11-1940  Transition of Care [facility]) CM/SW Contact:  Teosha Casso, Chauncey Reading, RN Phone Number: 06/11/2018, 11:22 AM   Clinical Narrative:   Transferring to residential hospice today. EMS called. Bedside RN updated to call report to [phone].     Final next level of care: [place]      Discharge Placement              Patient chooses bed at: Other - please specify in the comment section below:([facility], son elects residentail hospice after talk with attending MD) Patient to be transferred to facility by: EMS Name of family member notified: son, Kevin(son at hospice home now) Patient and family notified of of transfer: 06/11/18(son at hospice facility now signing paperwork, hospice requesting EMS transport now)       Readmission Risk Interventions Readmission Risk Prevention Plan 06/10/2018  Palliative Care Screening Complete  Some recent data might be hidden

## 2018-06-11 NOTE — Plan of Care (Signed)
  Problem: Health Behavior/Discharge Planning: Goal: Ability to manage health-related needs will improve Outcome: Progressing   Problem: Nutrition: Goal: Adequate nutrition will be maintained Outcome: Progressing   Problem: Coping: Goal: Level of anxiety will decrease Outcome: Progressing   Problem: Pain Managment: Goal: General experience of comfort will improve Outcome: Progressing   Problem: Safety: Goal: Ability to remain free from injury will improve Outcome: Progressing   Problem: Skin Integrity: Goal: Risk for impaired skin integrity will decrease Outcome: Progressing

## 2018-06-11 NOTE — Care Management (Addendum)
Patient Information   Patient Name Melinda Larsen, Melinda Larsen ([id number]) Sex Female DOB Mar 02, 1940  Room Bed  A302 A302-01  Patient Demographics   Address [place] [address] Phone [phone] (Home)  Patient Ethnicity & Race   Ethnic Group Patient Race  Not Hispanic or Latino Black or African American  Emergency Contact(s)   Name Relation Home Work Mobile  Evin, Chirco [phone]  [phone]  Lucrezia Starch [phone]  [phone]  baldwin,sandra Sister   [phone]  Documents on File    Status Date Received Description  Documents for the Patient  Driver's License  28/20/60   [place] HIPAA NOTICE OF PRIVACY - Scanned Not Received    [place] E-Signature HIPAA Notice of Privacy Received 08/16/10   [place] E-Signature HIPAA Notice of Privacy Spanish     Insurance Card Received 08/16/10   Advance Directives/Living Will/HCPOA/POA Not Received    Financial Application Not Received    Insurance Card  08/16/10   Insurance Card  04/08/13   Other Photo ID Not Received    Insurance Card Received 07/16/17 HU[employer]NA MEDICARE  Release of Information Received 06/16/17 rga  AMB Correspondence Received 04/30/17 REFERRAL [place]  Driver's License Received 15/61/53 10/2021  Insurance Card   NEW MEDICARE  Insurance Card Received 11/18/17   Consent Form     HIM ROI Authorization  12/25/17   Insurance Card Received 02/10/18   Insurance Card Received 02/10/18   Insurance Card Received 02/11/18 HUMANA  NEW CARD  [place] E-Signature HIPAA Notice of Privacy Signed 03/24/18   AMB Correspondence Received 03/24/18 HUMANA CONFIRMATION NUMBER FOR EXAM SCHEDULING  AMB Referral Received 06/02/18 HIGGS MD, V  HIM Release of Information Output (Deleted) 12/25/17 Requested records  Documents for the Encounter  AOB (Assignment of Insurance Benefits) Received 06/06/18 AOB - Pt unable to sign/give consent due to condition-no family present   E-signature AOB     MEDICARE RIGHTS Received 06/06/18 Pt unable to sign/give consent due to condition-no family present  E-signature Medicare Rights     ED Patient Billing Extract   ED PB Billing Extract  Correspondence Received 06/08/18   Admission Information   Current Information   Attending Provider Admitting Provider Admission Type Admission Status  Tat, Shanon Brow, MD Murlean Iba, MD Emergency Admission (Confirmed)       Admission Date/Time Discharge Date Hospital Service Auth/Cert Status  79/43/27 08:46 AM  Internal Medicine [facility] Room/Bed   [facility] AP-DEPT 300 A302/A302-01        Admission   Complaint  abdominal pain  Hospital Account   Name Acct ID Class Status Primary Coverage  Melinda Larsen, Melinda Larsen [id number] Inpatient Open [place]      Guarantor Account (for [place] [id number])   Name Relation to [place]? Acct Type  Melinda Larsen Self CHSA Yes Personal/Family  Address Phone    [address] [place], [place] [address] [phone])        Coverage Information (for Hospital Account [id number])   F/O Payor/Plan Precert #  [facility] [place] H[employer]   Subscriber Subscriber #  Melinda Larsen, Melinda Larsen [id number]  Address Phone  [address] [place] [place] [address] [phone]   [place] [address] [phone]

## 2018-06-11 NOTE — Progress Notes (Signed)
Palliative:  Ms. Melinda Larsen is resting comfortably. Having some secretions so I scheduled [place]. Discussed with Dr. Carles Collet who was able to f/u with son yesterday evening and with plans to transition to hospice today.   No charge  Vinie Sill, NP Palliative Medicine Team Pager # [phone] (M-F 8a-5p) Team Phone # [phone] (Nights/Weekends)

## 2018-06-15 ENCOUNTER — Other Ambulatory Visit: Payer: Self-pay | Admitting: Radiation Therapy

## 2018-06-15 ENCOUNTER — Encounter (HOSPITAL_COMMUNITY): Payer: Self-pay | Admitting: Internal Medicine

## 2018-06-15 LAB — CULTURE, BLOOD (ROUTINE X 2)
Culture: NO GROWTH
Special Requests: ADEQUATE

## 2018-06-18 ENCOUNTER — Ambulatory Visit (HOSPITAL_COMMUNITY): Payer: Medicare HMO

## 2018-06-22 ENCOUNTER — Encounter: Payer: Self-pay | Admitting: Radiation Therapy

## 2018-06-22 ENCOUNTER — Ambulatory Visit: Payer: Self-pay | Admitting: Radiation Oncology

## 2018-06-22 ENCOUNTER — Ambulatory Visit: Payer: Medicare HMO | Admitting: Radiation Oncology

## 2018-07-06 DEATH — deceased

## 2020-01-27 IMAGING — PT NUCLEAR MEDICINE PET IMAGE RESTAGING (PS) SKULL BASE TO THIGH
4 series · 25 of 25 positions shown · non-contrast
Comparison: 03/12/2018

CLINICAL DATA: Subsequent treatment strategy for non-small cell
lung cancer.

EXAM:
NUCLEAR MEDICINE PET SKULL BASE TO THIGH
TECHNIQUE: 7.76 mCi F-18 FDG was injected intravenously. Full-ring PET imaging
was performed from the skull base to thigh after the radiotracer. CT
data was obtained and used for attenuation correction and anatomic
localization.
Fasting blood glucose: 86 mg/dl

[Series 3: ct wb fusion · axial · 5.0mm · 0.98mm/px · z∈[-1550,-675]mm · 8 of 351 slices shown]
[im 1/351]
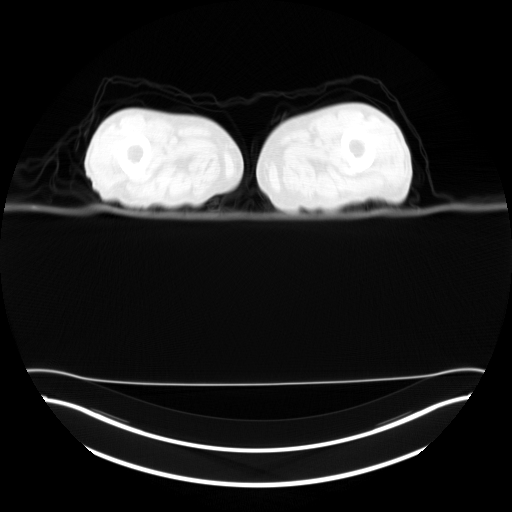
[im 51/351]
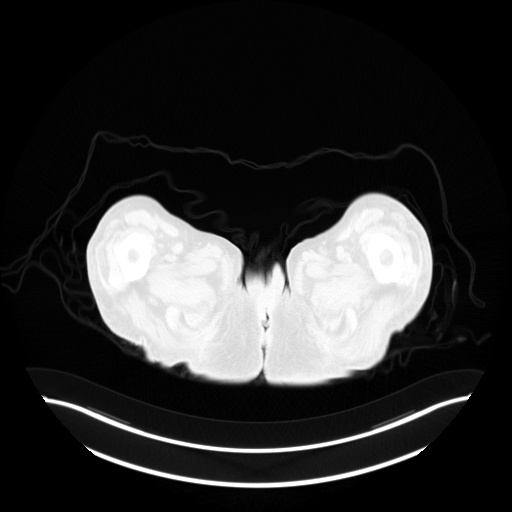
[im 101/351]
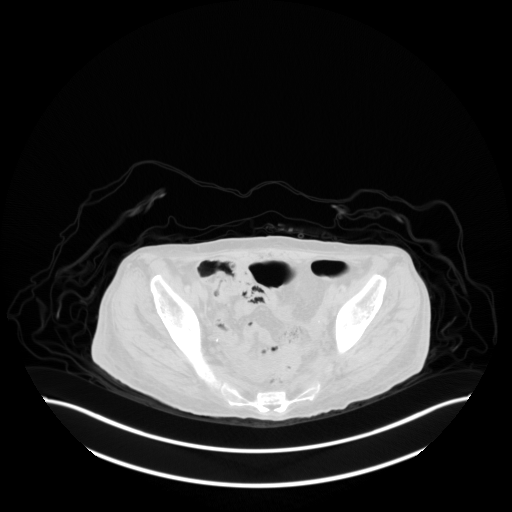
[im 151/351]
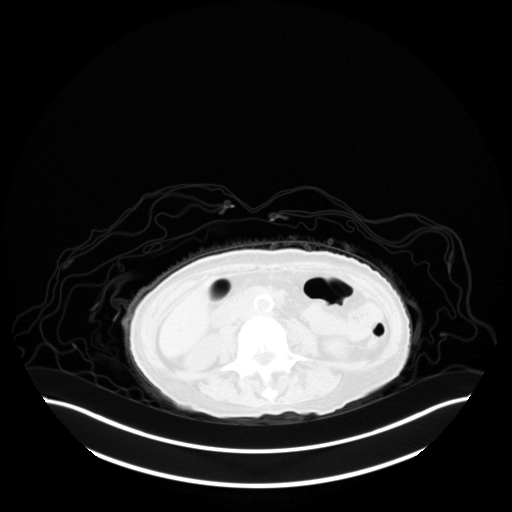
[im 201/351]
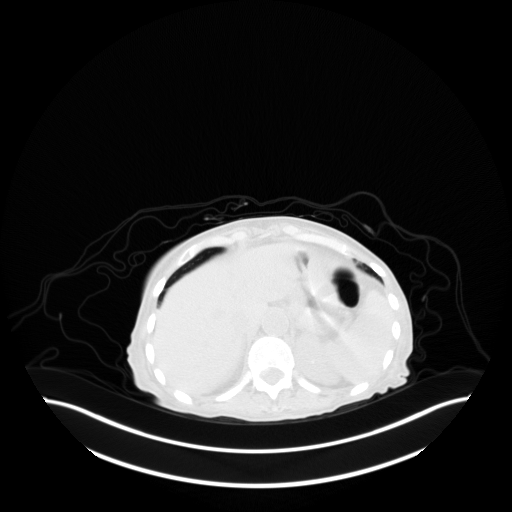
[im 251/351]
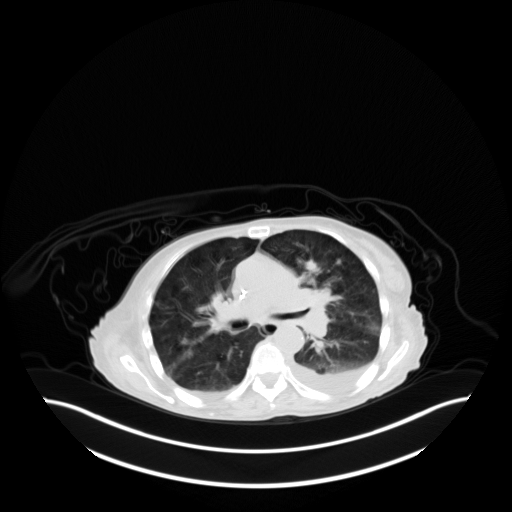
[im 301/351]
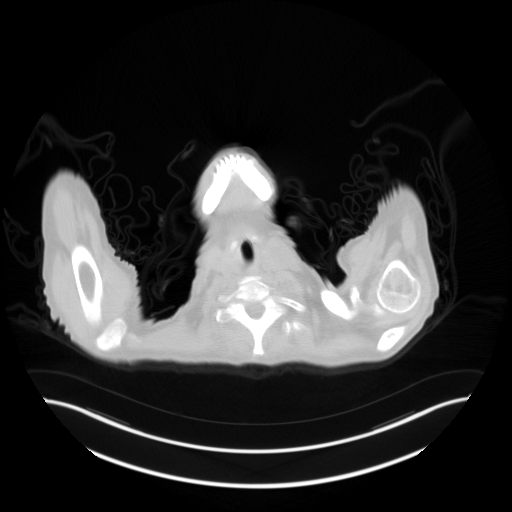
[im 351/351  brain]
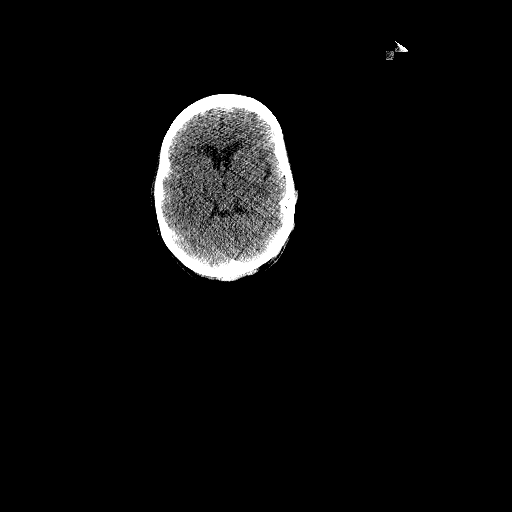

[Series 4: pet wb · axial · 5.0mm · 4.11mm/px · z∈[-1550,-675]mm · 8 of 351 slices shown]
[im 1/351]
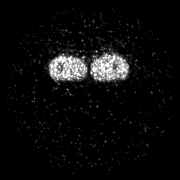
[im 51/351]
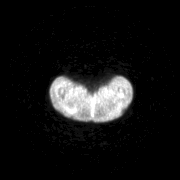
[im 101/351]
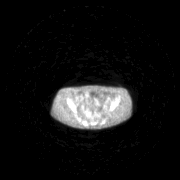
[im 151/351]
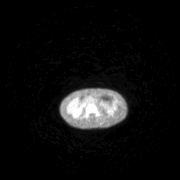
[im 201/351]
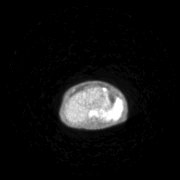
[im 251/351]
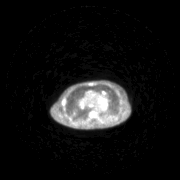
[im 301/351]
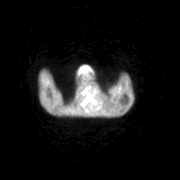
[im 351/351]
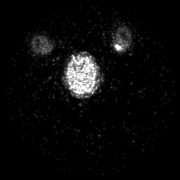

[Series 5: pet wb uncorrected · axial · 5.0mm · 4.11mm/px · z∈[-1550,-675]mm · 8 of 351 slices shown]
[im 1/351]
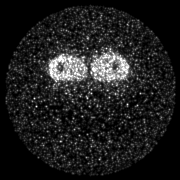
[im 51/351]
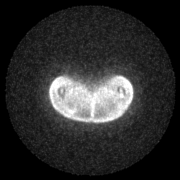
[im 101/351]
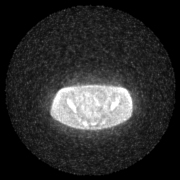
[im 151/351]
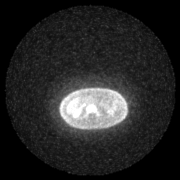
[im 201/351]
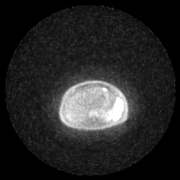
[im 251/351]
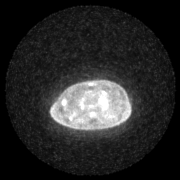
[im 301/351]
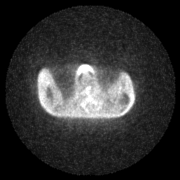
[im 351/351]
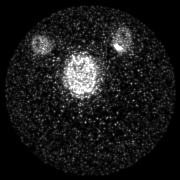

[results mm oncology reading · 5.0mm · 0.50mm/px · 1 of 17 slices shown]
[im 1/17]
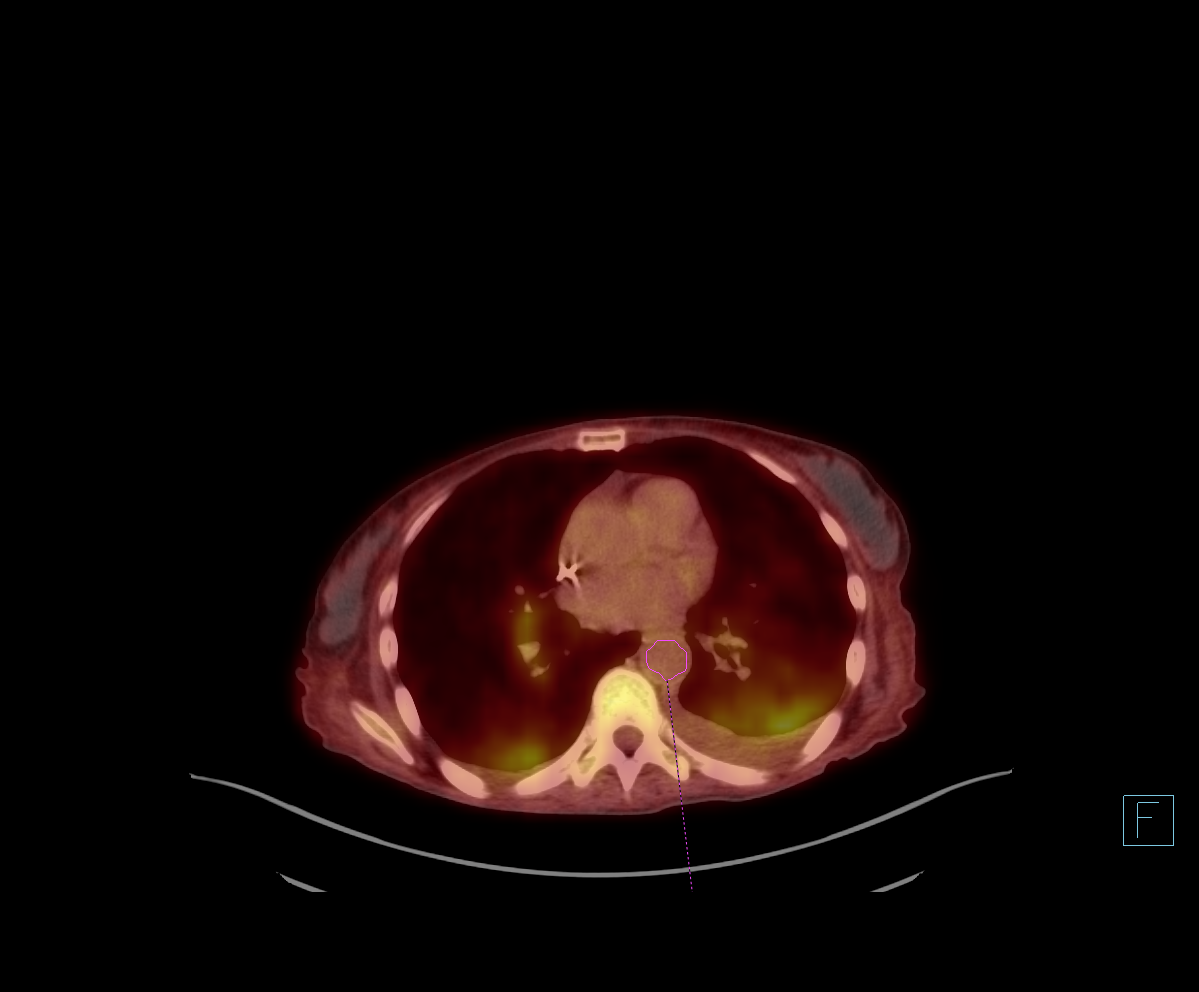

[25 of 25 positions shown; findings below may reference images not displayed]

FINDINGS: Mediastinal blood pool activity: SUV max

NECK: Previous right lobe of thyroid gland nodule has an SUV max of
2.8. Previously 4.4. No hypermetabolic lymph nodes within the soft
tissues of the neck.

Incidental CT findings: none

CHEST: The left upper lobe paramediastinal lung mass which invades
the AP window region measures 7.4 cm on today's exam and has an SUV
max of free 8.32. Previously this measured 5.5 cm within SUV max
11.12. Hypermetabolic left hilar lymph node measures 1.8 cm and has
an SUV max of 7.1. Previously 1.9 cm within SUV max of 9.9. Increase
in postobstructive changes within the posterior left upper lobe,
image 83/3. There is a new small to moderate left pleural effusion.
Dense airspace consolidation within the left lower lobe is
identified with corresponding increased uptake within SUV max of
7.6. New subsegmental atelectasis within the posterior right base.

Incidental CT findings: Centrilobular emphysema. Aortic
atherosclerosis. Lad coronary artery calcifications.

ABDOMEN/PELVIS: Within the left upper quadrant of the abdomen there
is a new hypermetabolic nodule along the undersurface of the left
hemidiaphragm anteriorly which measures approximately 1.3 cm and has
an SUV max of 6.65. No abnormal uptake identified within the liver,
pancreas. Similar mild asymmetric hypermetabolism within the left
adrenal gland. Diffuse increased uptake identified throughout the
liver which is favored to represent. Post treatment changes. No
hypermetabolic abdominopelvic adenopathy.

Incidental CT findings: none

SKELETON: Hypermetabolic lytic lesion involving the right iliac bone
is again noted measuring 1.5 cm within SUV max of 12.8.
Hypermetabolic right fourth rib metastases appears lytic. This
measures 2 cm within SUV max of 4.9. Previously this measured 1 cm
with SUV max of 4.68. New hypermetabolic lesion within the right
sacrum has an SUV max of 6.04. This measures approximately 9 mm,
image 240/3. The hypermetabolic lesion involving the proximal left
femur appears increased in size. SUV max is equal to 5.75.
Previously 6.3. Lytic lesion involving the greater trochanter
measures 1.3 cm and has an SUV max of 4.6. Previously this measured
0.8 cm within SUV max of 4.9. Of potential orthopedic significance
is a progressive cortical lesion involving the right femoral neck
which measures 1.8 cm and has an SUV max of 4.3. Previously 0.7 cm
with SUV max of 3.9. Lytic lesion involving the superior endplate of
L2 measures 1.4 cm without significant FDG uptake above background
activity. On the previous exam this measured 0.5 cm. Also in the L2
vertebra is a 9 mm lytic lesion without significant uptake above
background activity. This lesion was barely visible on previous
exam. Lytic lesion involving the posterior aspect of the left
eleventh rib measures 9 mm with SUV max of 5.76. Previously 6 mm
with SUV max of 5.02.

Multifocal hypermetabolic intramuscular metastases. Left gluteal
soft tissue metastasis has an SUV max of 5.34. Previously 3.84. Left
paraspinous muscle metastasis has an SUV max of 4 point 1 4. New
from previous exam. Right gluteal metastasis has an SUV max of 3.19.
Previously

Incidental CT findings: none
IMPRESSION: 1. Overall interval progression of disease.
2. Increase in size of left upper lobe lung mass with involvement of
the left hilum and mediastinum. There is new postobstructive
pneumonitis within the posterior left upper lobe.
3. Multifocal lytic bone metastases are again noted. Compared with
previous exam most of these lesions have increased in size in the
interval. Variable FDG uptake associated with these metastases are
noted, similar to previous study. A few lesions which are larger
exhibit decreased FDG uptake from previous study. Other enlarging
lytic lesions exhibit increased uptake. Progressive lytic lesion
involving the right femoral neck may be of orthopedic significance.
4. Skeletal muscle metastasis appear slightly progressive from
previous exam.
5. New hypermetabolic nodule is identified within the left upper
quadrant of the abdomen abutting the undersurface of the left
hemidiaphragm. Suspicious for metastasis.
6. New left pleural effusion and new bilateral lower lobe airspace
consolidation, left greater than right. Findings are favored to
represent sequelae of pneumonia and/or aspiration.
7. Aortic Atherosclerosis ([id number]-5D7.7) and Emphysema ([id number]).
8. Coronary artery calcifications
